# Patient Record
Sex: Female | Born: 1970
Health system: Southern US, Community
[De-identification: ages and names within clinical notes are randomized; demographics above are authoritative.]

## PROBLEM LIST (undated history)

## (undated) ENCOUNTER — Inpatient Hospital Stay (HOSPITAL_COMMUNITY): Payer: Self-pay

## (undated) DIAGNOSIS — O139 Gestational [pregnancy-induced] hypertension without significant proteinuria, unspecified trimester: Secondary | ICD-10-CM

## (undated) DIAGNOSIS — T4145XA Adverse effect of unspecified anesthetic, initial encounter: Secondary | ICD-10-CM

## (undated) DIAGNOSIS — L900 Lichen sclerosus et atrophicus: Secondary | ICD-10-CM

## (undated) DIAGNOSIS — I499 Cardiac arrhythmia, unspecified: Secondary | ICD-10-CM

## (undated) DIAGNOSIS — A630 Anogenital (venereal) warts: Secondary | ICD-10-CM

## (undated) DIAGNOSIS — C73 Malignant neoplasm of thyroid gland: Secondary | ICD-10-CM

## (undated) DIAGNOSIS — L309 Dermatitis, unspecified: Secondary | ICD-10-CM

## (undated) DIAGNOSIS — T8859XA Other complications of anesthesia, initial encounter: Secondary | ICD-10-CM

## (undated) DIAGNOSIS — F32A Depression, unspecified: Secondary | ICD-10-CM

## (undated) DIAGNOSIS — K219 Gastro-esophageal reflux disease without esophagitis: Secondary | ICD-10-CM

## (undated) DIAGNOSIS — E119 Type 2 diabetes mellitus without complications: Secondary | ICD-10-CM

## (undated) DIAGNOSIS — Z332 Encounter for elective termination of pregnancy: Secondary | ICD-10-CM

## (undated) DIAGNOSIS — T7840XA Allergy, unspecified, initial encounter: Secondary | ICD-10-CM

## (undated) DIAGNOSIS — M1990 Unspecified osteoarthritis, unspecified site: Secondary | ICD-10-CM

## (undated) DIAGNOSIS — O24419 Gestational diabetes mellitus in pregnancy, unspecified control: Secondary | ICD-10-CM

## (undated) DIAGNOSIS — I10 Essential (primary) hypertension: Secondary | ICD-10-CM

## (undated) HISTORY — PX: TUBAL LIGATION: SHX77

## (undated) HISTORY — DX: Depression, unspecified: F32.A

## (undated) HISTORY — DX: Allergy, unspecified, initial encounter: T78.40XA

## (undated) HISTORY — PX: WISDOM TOOTH EXTRACTION: SHX21

## (undated) HISTORY — DX: Unspecified osteoarthritis, unspecified site: M19.90

---

## 1990-10-10 HISTORY — PX: BLADDER SURGERY: SHX569

## 2006-10-10 HISTORY — PX: OTHER SURGICAL HISTORY: SHX169

## 2012-10-10 HISTORY — PX: MYOMECTOMY: SHX85

## 2013-09-10 ENCOUNTER — Other Ambulatory Visit: Payer: Self-pay | Admitting: *Deleted

## 2013-09-10 DIAGNOSIS — R0602 Shortness of breath: Secondary | ICD-10-CM

## 2013-09-13 ENCOUNTER — Ambulatory Visit: Payer: PRIVATE HEALTH INSURANCE | Admitting: *Deleted

## 2013-09-13 DIAGNOSIS — R0602 Shortness of breath: Secondary | ICD-10-CM

## 2013-09-17 ENCOUNTER — Ambulatory Visit: Payer: Self-pay | Admitting: Cardiology

## 2013-09-18 ENCOUNTER — Ambulatory Visit (HOSPITAL_COMMUNITY)
Admission: RE | Admit: 2013-09-18 | Discharge: 2013-09-18 | Disposition: A | Discharge status: Home / Self Care | Payer: PRIVATE HEALTH INSURANCE | Source: Ambulatory Visit | Attending: Cardiovascular Disease | Admitting: Cardiovascular Disease

## 2013-09-18 DIAGNOSIS — R0602 Shortness of breath: Secondary | ICD-10-CM

## 2013-10-08 ENCOUNTER — Encounter: Payer: Self-pay | Admitting: Cardiology

## 2014-03-29 ENCOUNTER — Inpatient Hospital Stay (HOSPITAL_COMMUNITY)
Admission: AD | Admit: 2014-03-29 | Discharge: 2014-03-29 | Disposition: A | Discharge status: Home / Self Care | Payer: PRIVATE HEALTH INSURANCE | Source: Ambulatory Visit | Attending: Obstetrics and Gynecology | Admitting: Obstetrics and Gynecology

## 2014-03-29 ENCOUNTER — Encounter (HOSPITAL_COMMUNITY): Payer: Self-pay | Admitting: *Deleted

## 2014-03-29 ENCOUNTER — Inpatient Hospital Stay (HOSPITAL_COMMUNITY): Payer: PRIVATE HEALTH INSURANCE

## 2014-03-29 DIAGNOSIS — O200 Threatened abortion: Secondary | ICD-10-CM | POA: Insufficient documentation

## 2014-03-29 DIAGNOSIS — Z87891 Personal history of nicotine dependence: Secondary | ICD-10-CM | POA: Insufficient documentation

## 2014-03-29 DIAGNOSIS — O4690 Antepartum hemorrhage, unspecified, unspecified trimester: Secondary | ICD-10-CM

## 2014-03-29 DIAGNOSIS — O209 Hemorrhage in early pregnancy, unspecified: Secondary | ICD-10-CM | POA: Diagnosis present

## 2014-03-29 DIAGNOSIS — O09819 Supervision of pregnancy resulting from assisted reproductive technology, unspecified trimester: Secondary | ICD-10-CM | POA: Insufficient documentation

## 2014-03-29 HISTORY — DX: Anogenital (venereal) warts: A63.0

## 2014-03-29 HISTORY — DX: Cardiac arrhythmia, unspecified: I49.9

## 2014-03-29 LAB — WET PREP, GENITAL
Clue Cells Wet Prep HPF POC: NONE SEEN
Trich, Wet Prep: NONE SEEN
Yeast Wet Prep HPF POC: NONE SEEN

## 2014-03-29 LAB — URINALYSIS, ROUTINE W REFLEX MICROSCOPIC
Bilirubin Urine: NEGATIVE
Glucose, UA: NEGATIVE mg/dL
Ketones, ur: NEGATIVE mg/dL
Leukocytes, UA: NEGATIVE
Nitrite: NEGATIVE
Protein, ur: NEGATIVE mg/dL
Specific Gravity, Urine: <1.005 — ABNORMAL LOW (ref 1.005–1.030)
Urobilinogen, UA: 0.2 mg/dL (ref 0.0–1.0)
pH: 5.5 (ref 5.0–8.0)

## 2014-03-29 LAB — ABO/RH: ABO/RH(D): O POS

## 2014-03-29 LAB — URINE MICROSCOPIC-ADD ON

## 2014-03-29 LAB — CBC
HCT: 37.8 % (ref 36.0–46.0)
Hemoglobin: 13 g/dL (ref 12.0–15.0)
MCH: 29.7 pg (ref 26.0–34.0)
MCHC: 34.4 g/dL (ref 30.0–36.0)
MCV: 86.5 fL (ref 78.0–100.0)
Platelets: 217 10*3/uL (ref 150–400)
RBC: 4.37 MIL/uL (ref 3.87–5.11)
RDW: 13.2 % (ref 11.5–15.5)
WBC: 10.8 10*3/uL — ABNORMAL HIGH (ref 4.0–10.5)

## 2014-03-29 LAB — POCT PREGNANCY, URINE: Preg Test, Ur: POSITIVE — AB

## 2014-03-29 LAB — HCG, QUANTITATIVE, PREGNANCY: hCG, Beta Chain, Quant, S: 12599 m[IU]/mL — ABNORMAL HIGH (ref <5)

## 2014-03-29 LAB — PROGESTERONE: Progesterone: 22.2 ng/mL

## 2014-03-29 NOTE — MAU Note (Signed)
Patient presents with complaint of vaginal bleeding since 1100 today.

## 2014-03-29 NOTE — MAU Provider Note (Signed)
History     CSN: [id number]  Arrival date and time: 03/29/14 1143 Provider notified by MAU Provider: [id number] Provider on unit: 1250 Provider at bedside: 1300   First Provider Initiated Contact with Patient 03/29/14 1241 (by MAU Provider)    Chief Complaint  Patient presents with  . Vaginal Bleeding   HPI  Pamela [name] is 43 y.o. G2P0010 female at 61.[redacted]wks gestation determined by IVF conception date of 03/02/2014 presenting today with complaints of VB since 43. Denies pain.  Her care is being followed by Dr. Kerin Perna at this time.  She had an ultrasound Thursday 6/18 that she states was "normal".  She is taking Progesterone.    Past Medical History  Diagnosis Date  . Genital warts     removed  . Recurrent UTI (urinary tract infection)   . Dysrhythmia     resolved- "grew out of it"  . Bleeding in early pregnancy 03/29/2014    Past Surgical History  Procedure Laterality Date  . Bladder surgery  1992    Widened the urethra  . Tubal ligation  2008  . Myomectomy  2008  . Myomectomy  09/2013    Removed tubes bilaterally  - Salpingectomy          09/2013  History reviewed. No pertinent family history.  History  Substance Use Topics  . Smoking status: Former Research scientist (life sciences)  . Smokeless tobacco: Never Used  . Alcohol Use: Yes     Comment: Socially PRIOR to pregnancy    Allergies:  Allergies  Allergen Reactions  . Augmentin [Amoxicillin-Pot Clavulanate] Nausea And Vomiting    No prescriptions prior to admission    Review of Systems  Constitutional: Negative.   HENT: Negative.   Eyes: Negative.   Respiratory: Negative.   Cardiovascular: Negative.   Gastrointestinal: Negative.   Genitourinary:       (+) VB  Musculoskeletal: Negative.   Skin: Negative.   Neurological: Negative.   Endo/Heme/Allergies: Negative.   Psychiatric/Behavioral: Negative.    Results for orders placed during the hospital encounter of 03/29/14 (from the past 24 hour(s))  URINALYSIS,  ROUTINE W REFLEX MICROSCOPIC     Status: Abnormal   Collection Time    03/29/14 11:50 AM      Result Value Ref Range   Color, Urine YELLOW  YELLOW   APPearance CLEAR  CLEAR   Specific Gravity, Urine <1.005 (*) 1.005 - 1.030   pH 5.5  5.0 - 8.0   Glucose, UA NEGATIVE  NEGATIVE mg/dL   Hgb urine dipstick LARGE (*) NEGATIVE   Bilirubin Urine NEGATIVE  NEGATIVE   Ketones, ur NEGATIVE  NEGATIVE mg/dL   Protein, ur NEGATIVE  NEGATIVE mg/dL   Urobilinogen, UA 0.2  0.0 - 1.0 mg/dL   Nitrite NEGATIVE  NEGATIVE   Leukocytes, UA NEGATIVE  NEGATIVE  URINE MICROSCOPIC-ADD ON     Status: None   Collection Time    03/29/14 11:50 AM      Result Value Ref Range   Squamous Epithelial / LPF RARE  RARE   WBC, UA 0-2  <3 WBC/hpf   RBC / HPF 0-2  <3 RBC/hpf  POCT PREGNANCY, URINE     Status: Abnormal   Collection Time    03/29/14 12:46 PM      Result Value Ref Range   Preg Test, Ur POSITIVE (*) NEGATIVE  HCG, QUANTITATIVE, PREGNANCY     Status: Abnormal   Collection Time    03/29/14 12:53 PM  Result Value Ref Range   hCG, Beta Chain, [place], [place] [address] (*) <5 mIU/mL  ABO/RH     Status: None   Collection Time    03/29/14  1:09 PM      Result Value Ref Range   ABO/RH(D) O POS    WET PREP, GENITAL     Status: Abnormal   Collection Time    03/29/14  1:25 PM      Result Value Ref Range   Yeast Wet Prep HPF POC NONE SEEN  NONE SEEN   Trich, Wet Prep NONE SEEN  NONE SEEN   Clue Cells Wet Prep HPF POC NONE SEEN  NONE SEEN   WBC, Wet Prep HPF POC FEW (*) NONE SEEN  CBC     Status: Abnormal   Collection Time    03/29/14  2:34 PM      Result Value Ref Range   WBC 10.8 (*) 4.0 - 10.5 K/uL   RBC 4.37  3.87 - 5.11 MIL/uL   Hemoglobin 13.0  12.0 - 15.0 g/dL   HCT 37.8  36.0 - 46.0 %   MCV 86.5  78.0 - 100.0 fL   MCH 29.7  26.0 - 34.0 pg   MCHC 34.4  30.0 - 36.0 g/dL   RDW 13.2  11.5 - 15.5 %   Platelets 217  150 - 400 K/uL   OB U/S <14 wks CLINICAL DATA: 43 year old pregnant female with  bleeding. IVF with  estimated gestational age of [redacted] weeks 6 days. Beta HCG of 12,600.  EXAM:  OBSTETRIC <14 WK [place] AND TRANSVAGINAL OB US  TECHNIQUE:  Both transabdominal and transvaginal ultrasound examinations were  performed for complete evaluation of the gestation as well as the  maternal uterus, adnexal regions, and pelvic cul-de-sac.  Transvaginal technique was performed to assess early pregnancy.  COMPARISON: None.  FINDINGS:  Intrauterine gestational sac: Visualized/normal in shape.  Yolk sac: Visualized  Embryo: Visualized  Cardiac Activity: Equivocal with suggestion of fetal heart flicker  at real-time but untraceable M-Mode.  CRL: 2.2 mm 5 w 6 d [place] EDC: 11/23/2014  Maternal uterus/adnexae: There is no evidence of subchorionic  hemorrhage.  Ovaries bilaterally are unremarkable.  Multiple uterine fibroids are identified with the largest as  follows:  1.1 x 1.1 x 2.1 cm anterior right lower uterine segment fibroid  adjacent to the gestational sac.  1 x 1.1 x 1.1 cm left anterior fundal fibroid adjacent to the  gestational sac.  1.6 x 1.5 x 1.4 cm posterior left intramural uterine body fibroid.  No free fluid or adnexal mass identified.  IMPRESSION:  Single intrauterine gestation with equivocal cardiac activity as  described. Estimated gestational age and gestational age by this  ultrasound are 5 weeks 6 days.  Uterine fibroids as described, two which lie adjacent to the  intrauterine gestational sac.  No evidence of subchorionic hemorrhage.   Electronically Signed  By: Hassan Rowan M.D.  On: 03/29/2014 14:31  Physical Exam   Blood pressure 130/79, pulse 68, temperature 98.3 F (36.8 C), temperature source Oral, resp. rate 18, height 5\' 8"  (1.727 m), weight 93.441 kg (206 lb), last menstrual period 11/22/2013.  Physical Exam  Constitutional: She is oriented to person, place, and time. She appears well-developed and well-nourished.  HENT:  Head: Normocephalic and  atraumatic.  Eyes: Pupils are equal, round, and reactive to light.  Neck: Normal range of motion. Neck supple.  Cardiovascular: Normal rate, regular rhythm and normal heart sounds.   Respiratory: Effort  normal and breath sounds normal.  GI: Soft. Bowel sounds are normal.  Genitourinary: Uterus normal.  Small amount of dark, red blood in vaginal vault; cx closed/thick/high/firm; enlarged uterus  Musculoskeletal: Normal range of motion.  Neurological: She is alert and oriented to person, place, and time.  Skin: Skin is warm and dry.  Psychiatric: She has a normal mood and affect. Her behavior is normal. Judgment and thought content normal.    MAU Course  Procedures ABO/Rh CBC HCG Wet prep GC/CT OB U/S <14wks  Assessment and Plan  43 yo G2P0010 @ 5.6wks IVF gestation Bleeding in early Pregnancy Threatened Miscarriage  Discharge Home Bleeding and Threatened miscarriage precautions reviewed F/U with Dr. Kerin Perna either Monday (6/22) or Tuesday (6/23) Call for increased bleeding and/or pain  *Dr. Ronita Hipps notified of assessment and plan - agrees  Graceann Congress MSN, CNM 03/29/2014, 3:14 PM

## 2014-03-29 NOTE — Discharge Instructions (Signed)
Vaginal Bleeding During Pregnancy, First Trimester °A small amount of bleeding (spotting) from the vagina is relatively common in early pregnancy. It usually stops on its own. Various things may cause bleeding or spotting in early pregnancy. Some bleeding may be related to the pregnancy, and some may not. In most cases, the bleeding is normal and is not a problem. However, bleeding can also be a sign of something serious. Be sure to tell your health care provider about any vaginal bleeding right away. °Some possible causes of vaginal bleeding during the first trimester include: °· Infection or inflammation of the cervix. °· Growths (polyps) on the cervix. °· Miscarriage or threatened miscarriage. °· Pregnancy tissue has developed outside of the uterus and in a fallopian tube (tubal pregnancy). °· Tiny cysts have developed in the uterus instead of pregnancy tissue (molar pregnancy). °HOME CARE INSTRUCTIONS  °Watch your condition for any changes. The following actions may help to lessen any discomfort you are feeling: °· Follow your health care provider's instructions for limiting your activity. If your health care provider orders bed rest, you may need to stay in bed and only get up to use the bathroom. However, your health care provider may allow you to continue light activity. °· If needed, make plans for someone to help with your regular activities and responsibilities while you are on bed rest. °· Keep track of the number of pads you use each day, how often you change pads, and how soaked (saturated) they are. Write this down. °· Do not use tampons. Do not douche. °· Do not have sexual intercourse or orgasms until approved by your health care provider. °· If you pass any tissue from your vagina, save the tissue so you can show it to your health care provider. °· Only take over-the-counter or prescription medicines as directed by your health care provider. °· Do not take aspirin because it can make you  bleed. °· Keep all follow-up appointments as directed by your health care provider. °SEEK MEDICAL CARE IF: °· You have any vaginal bleeding during any part of your pregnancy. °· You have cramps or labor pains. °· You have a fever, not controlled by medicine. °SEEK IMMEDIATE MEDICAL CARE IF:  °· You have severe cramps in your back or belly (abdomen). °· You pass large clots or tissue from your vagina. °· Your bleeding increases. °· You feel light-headed or weak, or you have fainting episodes. °· You have chills. °· You are leaking fluid or have a gush of fluid from your vagina. °· You pass out while having a bowel movement. °MAKE SURE YOU: °· Understand these instructions. °· Will watch your condition. °· Will get help right away if you are not doing well or get worse. °Document Released: 07/06/2005 Document Revised: 10/01/2013 Document Reviewed: 06/03/2013 °ExitCare® Patient Information ©2015 [employer]. This information is not intended to replace advice given to you by your health care provider. Make sure you discuss any questions you have with your health care provider. °Threatened Miscarriage °A threatened miscarriage occurs when you have vaginal bleeding during your first 20 weeks of pregnancy but the pregnancy has not ended. If you have vaginal bleeding during this time, your health care provider will do tests to make sure you are still pregnant. If the tests show you are still pregnant and the developing baby (fetus) inside your womb (uterus) is still growing, your condition is considered a threatened miscarriage. °A threatened miscarriage does not mean your pregnancy will end, but it does   increase the risk of losing your pregnancy (complete miscarriage). °CAUSES  °The cause of a threatened miscarriage is usually not known. If you go on to have a complete miscarriage, the most common cause is an abnormal number of chromosomes in the developing baby. Chromosomes are the structures inside cells that hold all  your genetic material. °Some causes of vaginal bleeding that do not result in miscarriage include: °· Having sex. °· Having an infection. °· Normal hormone changes of pregnancy. °· Bleeding that occurs when an egg implants in your uterus. °RISK FACTORS °Risk factors for bleeding in early pregnancy include: °· Obesity. °· Smoking. °· Drinking excessive amounts of alcohol or caffeine. °· Recreational drug use. °SIGNS AND SYMPTOMS °· Light vaginal bleeding. °· Mild abdominal pain or cramps. °DIAGNOSIS  °If you have bleeding with or without abdominal pain before 20 weeks of pregnancy, your health care provider will do tests to check whether you are still pregnant. One important test involves using sound waves and a computer (ultrasound) to create images of the inside of your uterus. Other tests include an internal exam of your vagina and uterus (pelvic exam) and measurement of your baby's heart rate.  °You may be diagnosed with a threatened miscarriage if: °· Ultrasound testing shows you are still pregnant. °· Your baby's heart rate is strong. °· A pelvic exam shows that the opening between your uterus and your vagina (cervix) is closed. °· Your heart rate and blood pressure are stable. °· Blood tests confirm you are still pregnant. °TREATMENT  °No treatments have been shown to prevent a threatened miscarriage from going on to a complete miscarriage. However, the right home care is important.  °HOME CARE INSTRUCTIONS  °· Make sure you keep all your appointments for prenatal care. This is very important. °· Get plenty of rest. °· Do not have sex or use tampons if you have vaginal bleeding. °· Do not douche. °· Do not smoke or use recreational drugs. °· Do not drink alcohol. °· Avoid caffeine. °SEEK MEDICAL CARE IF: °· You have light vaginal bleeding or spotting while pregnant. °· You have abdominal pain or cramping. °· You have a fever. °SEEK IMMEDIATE MEDICAL CARE IF: °· You have heavy vaginal bleeding. °· You have  blood clots coming from your vagina. °· You have severe low back pain or abdominal cramps. °· You have fever, chills, and severe abdominal pain. °MAKE SURE YOU: °· Understand these instructions. °· Will watch your condition. °· Will get help right away if you are not doing well or get worse. °Document Released: 09/26/2005 Document Revised: 10/01/2013 Document Reviewed: 07/23/2013 °ExitCare® Patient Information ©2015 [employer]. This information is not intended to replace advice given to you by your health care provider. Make sure you discuss any questions you have with your health care provider. ° ° °

## 2014-03-31 LAB — GC/CHLAMYDIA PROBE AMP
CT Probe RNA: NEGATIVE
GC Probe RNA: NEGATIVE

## 2014-04-25 LAB — OB RESULTS CONSOLE ANTIBODY SCREEN: Antibody Screen: NEGATIVE

## 2014-04-25 LAB — OB RESULTS CONSOLE HEPATITIS B SURFACE ANTIGEN: Hepatitis B Surface Ag: NEGATIVE

## 2014-04-25 LAB — OB RESULTS CONSOLE ABO/RH: RH Type: POSITIVE

## 2014-04-25 LAB — OB RESULTS CONSOLE RUBELLA ANTIBODY, IGM: Rubella: IMMUNE

## 2014-04-25 LAB — OB RESULTS CONSOLE HIV ANTIBODY (ROUTINE TESTING): HIV: NONREACTIVE

## 2014-04-25 LAB — OB RESULTS CONSOLE RPR: RPR: NONREACTIVE

## 2014-08-11 ENCOUNTER — Encounter (HOSPITAL_COMMUNITY): Payer: Self-pay | Admitting: *Deleted

## 2014-09-22 ENCOUNTER — Other Ambulatory Visit: Payer: Self-pay | Admitting: Obstetrics & Gynecology

## 2014-10-10 DIAGNOSIS — I10 Essential (primary) hypertension: Secondary | ICD-10-CM

## 2014-10-10 HISTORY — DX: Essential (primary) hypertension: I10

## 2014-11-03 ENCOUNTER — Encounter (HOSPITAL_COMMUNITY): Payer: Self-pay

## 2014-11-03 ENCOUNTER — Encounter (HOSPITAL_COMMUNITY)
Admission: RE | Admit: 2014-11-03 | Discharge: 2014-11-03 | Disposition: A | Discharge status: Home / Self Care | Payer: No Typology Code available for payment source | Source: Ambulatory Visit | Attending: Obstetrics & Gynecology | Admitting: Obstetrics & Gynecology

## 2014-11-03 VITALS — BP 129/87 | HR 58 | Resp 18 | Ht 68.0 in | Wt 218.0 lb

## 2014-11-03 DIAGNOSIS — O209 Hemorrhage in early pregnancy, unspecified: Secondary | ICD-10-CM

## 2014-11-03 HISTORY — DX: Essential (primary) hypertension: I10

## 2014-11-03 HISTORY — DX: Gastro-esophageal reflux disease without esophagitis: K21.9

## 2014-11-03 HISTORY — DX: Encounter for elective termination of pregnancy: Z33.2

## 2014-11-03 LAB — BASIC METABOLIC PANEL
Anion gap: 9 (ref 5–15)
BUN: 12 mg/dL (ref 6–23)
CO2: 22 mmol/L (ref 19–32)
Calcium: 10 mg/dL (ref 8.4–10.5)
Chloride: 105 mmol/L (ref 96–112)
Creatinine, Ser: 0.73 mg/dL (ref 0.50–1.10)
GFR calc Af Amer: >90 mL/min (ref >90)
GFR calc non Af Amer: >90 mL/min (ref >90)
Glucose, Bld: 136 mg/dL — ABNORMAL HIGH (ref 70–99)
Potassium: 4.2 mmol/L (ref 3.5–5.1)
Sodium: 136 mmol/L (ref 135–145)

## 2014-11-03 LAB — CBC
HCT: 38.4 % (ref 36.0–46.0)
Hemoglobin: 13.1 g/dL (ref 12.0–15.0)
MCH: 29.9 pg (ref 26.0–34.0)
MCHC: 34.1 g/dL (ref 30.0–36.0)
MCV: 87.7 fL (ref 78.0–100.0)
Platelets: 197 10*3/uL (ref 150–400)
RBC: 4.38 MIL/uL (ref 3.87–5.11)
RDW: 13.7 % (ref 11.5–15.5)
WBC: 11.3 10*3/uL — ABNORMAL HIGH (ref 4.0–10.5)

## 2014-11-03 LAB — TYPE AND SCREEN
ABO/RH(D): O POS
Antibody Screen: NEGATIVE

## 2014-11-03 NOTE — Anesthesia Preprocedure Evaluation (Signed)
Anesthesia Evaluation  Patient identified by MRN, date of birth, ID band Patient awake    Reviewed: Allergy & Precautions, NPO status , Patient's Chart, lab work & pertinent test results  History of Anesthesia Complications Negative for: history of anesthetic complications  Airway Mallampati: II  TM Distance: >3 FB Neck ROM: Full    Dental no notable dental hx. (+) Dental Advisory Given   Pulmonary former smoker,  breath sounds clear to auscultation  Pulmonary exam normal       Cardiovascular + dysrhythmias Rhythm:Regular Rate:Normal     Neuro/Psych negative neurological ROS  negative psych ROS   GI/Hepatic negative GI ROS, Neg liver ROS,   Endo/Other  negative endocrine ROS  Renal/GU negative Renal ROS  negative genitourinary   Musculoskeletal negative musculoskeletal ROS (+)   Abdominal   Peds negative pediatric ROS (+)  Hematology negative hematology ROS (+)   Anesthesia Other Findings   Reproductive/Obstetrics (+) Pregnancy                             Anesthesia Physical Anesthesia Plan  ASA: II  Anesthesia Plan: Spinal   Post-op Pain Management:    Induction:   Airway Management Planned:   Additional Equipment:   Intra-op Plan:   Post-operative Plan:   Informed Consent: I have reviewed the patients History and Physical, chart, labs and discussed the procedure including the risks, benefits and alternatives for the proposed anesthesia with the patient or authorized representative who has indicated his/her understanding and acceptance.   Dental advisory given  Plan Discussed with: CRNA  Anesthesia Plan Comments:         Anesthesia Quick Evaluation

## 2014-11-03 NOTE — Patient Instructions (Addendum)
   Your procedure is scheduled on: Tuesday, Jan 26  Enter through the Main Entrance of [facility] at: 12:45 PM Pick up the phone at the desk and dial [phone] and inform us of your arrival.  Please call this number if you have any problems the morning of surgery: [phone]  Remember: Do not eat food after midnight: Monday Do not drink clear liquids after: 10 AM Tuesday, day of surgery Take these medicines the morning of surgery with a SIP OF WATER: labetalol  Do not wear jewelry, make-up, or FINGER nail polish No metal in your hair or on your body. Do not wear lotions, powders, perfumes.  You may wear deodorant.  Do not bring valuables to the hospital. Contacts, dentures or bridgework may not be worn into surgery.  Leave suitcase in the car. After Surgery it may be brought to your room. For patients being admitted to the hospital, checkout time is 11:00am the day of discharge.  Home with husband Ronalee Belts cell [phone]

## 2014-11-04 ENCOUNTER — Inpatient Hospital Stay (HOSPITAL_COMMUNITY): Payer: No Typology Code available for payment source | Admitting: Anesthesiology

## 2014-11-04 ENCOUNTER — Encounter (HOSPITAL_COMMUNITY)
Admission: RE | Disposition: A | Discharge status: Home / Self Care | Payer: Self-pay | Source: Ambulatory Visit | Attending: Obstetrics & Gynecology

## 2014-11-04 ENCOUNTER — Encounter (HOSPITAL_COMMUNITY): Payer: Self-pay | Admitting: *Deleted

## 2014-11-04 ENCOUNTER — Inpatient Hospital Stay (HOSPITAL_COMMUNITY)
Admission: RE | Admit: 2014-11-04 | Discharge: 2014-11-07 | DRG: 765 | Disposition: A | Discharge status: Home / Self Care | Payer: No Typology Code available for payment source | Source: Ambulatory Visit | Attending: Obstetrics & Gynecology | Admitting: Obstetrics & Gynecology

## 2014-11-04 DIAGNOSIS — O3413 Maternal care for benign tumor of corpus uteri, third trimester: Secondary | ICD-10-CM | POA: Diagnosis present

## 2014-11-04 DIAGNOSIS — Z79899 Other long term (current) drug therapy: Secondary | ICD-10-CM

## 2014-11-04 DIAGNOSIS — O43813 Placental infarction, third trimester: Secondary | ICD-10-CM | POA: Diagnosis present

## 2014-11-04 DIAGNOSIS — O133 Gestational [pregnancy-induced] hypertension without significant proteinuria, third trimester: Secondary | ICD-10-CM | POA: Diagnosis present

## 2014-11-04 DIAGNOSIS — Z3A37 37 weeks gestation of pregnancy: Secondary | ICD-10-CM | POA: Diagnosis present

## 2014-11-04 DIAGNOSIS — O99214 Obesity complicating childbirth: Secondary | ICD-10-CM | POA: Diagnosis present

## 2014-11-04 DIAGNOSIS — O209 Hemorrhage in early pregnancy, unspecified: Secondary | ICD-10-CM | POA: Diagnosis present

## 2014-11-04 DIAGNOSIS — Z6834 Body mass index (BMI) 34.0-34.9, adult: Secondary | ICD-10-CM

## 2014-11-04 DIAGNOSIS — O09813 Supervision of pregnancy resulting from assisted reproductive technology, third trimester: Secondary | ICD-10-CM

## 2014-11-04 DIAGNOSIS — O9962 Diseases of the digestive system complicating childbirth: Secondary | ICD-10-CM | POA: Diagnosis present

## 2014-11-04 DIAGNOSIS — K219 Gastro-esophageal reflux disease without esophagitis: Secondary | ICD-10-CM | POA: Diagnosis present

## 2014-11-04 DIAGNOSIS — Z9889 Other specified postprocedural states: Secondary | ICD-10-CM | POA: Diagnosis not present

## 2014-11-04 DIAGNOSIS — O09523 Supervision of elderly multigravida, third trimester: Secondary | ICD-10-CM

## 2014-11-04 DIAGNOSIS — Z881 Allergy status to other antibiotic agents status: Secondary | ICD-10-CM | POA: Diagnosis not present

## 2014-11-04 DIAGNOSIS — O365930 Maternal care for other known or suspected poor fetal growth, third trimester, not applicable or unspecified: Secondary | ICD-10-CM | POA: Diagnosis present

## 2014-11-04 DIAGNOSIS — D62 Acute posthemorrhagic anemia: Secondary | ICD-10-CM | POA: Diagnosis not present

## 2014-11-04 DIAGNOSIS — Z87440 Personal history of urinary (tract) infections: Secondary | ICD-10-CM | POA: Diagnosis not present

## 2014-11-04 DIAGNOSIS — D250 Submucous leiomyoma of uterus: Secondary | ICD-10-CM | POA: Diagnosis present

## 2014-11-04 DIAGNOSIS — E669 Obesity, unspecified: Secondary | ICD-10-CM | POA: Diagnosis present

## 2014-11-04 DIAGNOSIS — Z87891 Personal history of nicotine dependence: Secondary | ICD-10-CM

## 2014-11-04 HISTORY — PX: MYOMECTOMY: SHX85

## 2014-11-04 LAB — RPR: RPR Ser Ql: NONREACTIVE

## 2014-11-04 SURGERY — Surgical Case
Anesthesia: Spinal | Site: Abdomen

## 2014-11-04 MED ORDER — BUPIVACAINE HCL (PF) 0.25 % IJ SOLN
INTRAMUSCULAR | Status: AC
  Filled 2014-11-04: qty 30

## 2014-11-04 MED ORDER — TETANUS-DIPHTH-ACELL PERTUSSIS 5-2.5-18.5 LF-MCG/0.5 IM SUSP: 0.5000 mL | Freq: Once | INTRAMUSCULAR | Status: DC

## 2014-11-04 MED ORDER — SIMETHICONE 80 MG PO CHEW
80.0000 mg | CHEWABLE_TABLET | ORAL | Status: DC
  Administered 2014-11-06 (×2): 80 mg via ORAL
  Filled 2014-11-04 (×3): qty 1

## 2014-11-04 MED ORDER — SCOPOLAMINE 1 MG/3DAYS TD PT72
1.0000 | MEDICATED_PATCH | Freq: Once | TRANSDERMAL | Status: DC
  Administered 2014-11-04: 1.5 mg via TRANSDERMAL

## 2014-11-04 MED ORDER — LACTATED RINGERS IV SOLN
40.0000 [IU] | INTRAVENOUS | Status: DC | PRN
  Administered 2014-11-04: 40 [IU] via INTRAVENOUS

## 2014-11-04 MED ORDER — NALOXONE HCL 0.4 MG/ML IJ SOLN: 0.4000 mg | INTRAMUSCULAR | Status: DC | PRN

## 2014-11-04 MED ORDER — SCOPOLAMINE 1 MG/3DAYS TD PT72: 1.0000 | MEDICATED_PATCH | Freq: Once | TRANSDERMAL | Status: DC

## 2014-11-04 MED ORDER — DIBUCAINE 1 % RE OINT: 1.0000 "application " | TOPICAL_OINTMENT | RECTAL | Status: DC | PRN

## 2014-11-04 MED ORDER — DIPHENHYDRAMINE HCL 25 MG PO CAPS: 25.0000 mg | ORAL_CAPSULE | Freq: Four times a day (QID) | ORAL | Status: DC | PRN

## 2014-11-04 MED ORDER — CEFAZOLIN SODIUM-DEXTROSE 2-3 GM-% IV SOLR
2.0000 g | INTRAVENOUS | Status: AC
  Administered 2014-11-04: 2 g via INTRAVENOUS

## 2014-11-04 MED ORDER — ONDANSETRON HCL 4 MG PO TABS: 4.0000 mg | ORAL_TABLET | ORAL | Status: DC | PRN

## 2014-11-04 MED ORDER — NALOXONE HCL 1 MG/ML IJ SOLN: 1.0000 ug/kg/h | INTRAMUSCULAR | Status: DC | PRN

## 2014-11-04 MED ORDER — ONDANSETRON HCL 4 MG/2ML IJ SOLN: 4.0000 mg | Freq: Three times a day (TID) | INTRAMUSCULAR | Status: DC | PRN

## 2014-11-04 MED ORDER — LACTATED RINGERS IV SOLN
INTRAVENOUS | Status: DC
  Administered 2014-11-04 (×2): via INTRAVENOUS

## 2014-11-04 MED ORDER — ACETAMINOPHEN 500 MG PO TABS
1000.0000 mg | ORAL_TABLET | Freq: Four times a day (QID) | ORAL | Status: DC
  Filled 2014-11-04: qty 2

## 2014-11-04 MED ORDER — PRENATAL MULTIVITAMIN CH
1.0000 | ORAL_TABLET | Freq: Every day | ORAL | Status: DC
  Administered 2014-11-05 – 2014-11-07 (×4): 1 via ORAL
  Filled 2014-11-04 (×3): qty 1

## 2014-11-04 MED ORDER — ERYTHROMYCIN 5 MG/GM OP OINT
TOPICAL_OINTMENT | OPHTHALMIC | Status: AC
Start: 2014-11-04 — End: 2014-11-05
  Filled 2014-11-04: qty 1

## 2014-11-04 MED ORDER — BUPIVACAINE IN DEXTROSE 0.75-8.25 % IT SOLN
INTRATHECAL | Status: DC | PRN
  Administered 2014-11-04: 1.6 mL via INTRATHECAL

## 2014-11-04 MED ORDER — KETOROLAC TROMETHAMINE 30 MG/ML IJ SOLN
INTRAMUSCULAR | Status: AC
  Filled 2014-11-04: qty 1

## 2014-11-04 MED ORDER — MENTHOL 3 MG MT LOZG: 1.0000 | LOZENGE | OROMUCOSAL | Status: DC | PRN

## 2014-11-04 MED ORDER — ONDANSETRON HCL 4 MG/2ML IJ SOLN: 4.0000 mg | Freq: Once | INTRAMUSCULAR | Status: DC | PRN

## 2014-11-04 MED ORDER — SCOPOLAMINE 1 MG/3DAYS TD PT72
MEDICATED_PATCH | TRANSDERMAL | Status: AC
  Administered 2014-11-04: 1.5 mg via TRANSDERMAL
  Filled 2014-11-04: qty 1

## 2014-11-04 MED ORDER — IBUPROFEN 600 MG PO TABS
600.0000 mg | ORAL_TABLET | Freq: Four times a day (QID) | ORAL | Status: DC
  Administered 2014-11-05 – 2014-11-07 (×9): 600 mg via ORAL
  Filled 2014-11-04 (×10): qty 1

## 2014-11-04 MED ORDER — LACTATED RINGERS IV SOLN
Freq: Once | INTRAVENOUS | Status: AC
Start: 2014-11-04 — End: 2014-11-04
  Administered 2014-11-04: 13:00:00 via INTRAVENOUS

## 2014-11-04 MED ORDER — LANOLIN HYDROUS EX OINT: 1.0000 "application " | TOPICAL_OINTMENT | CUTANEOUS | Status: DC | PRN

## 2014-11-04 MED ORDER — DIPHENHYDRAMINE HCL 25 MG PO CAPS: 25.0000 mg | ORAL_CAPSULE | ORAL | Status: DC | PRN

## 2014-11-04 MED ORDER — DIPHENHYDRAMINE HCL 50 MG/ML IJ SOLN
12.5000 mg | INTRAMUSCULAR | Status: DC | PRN
Start: 2014-11-04 — End: 2014-11-05

## 2014-11-04 MED ORDER — SODIUM CHLORIDE 0.9 % IJ SOLN: 3.0000 mL | INTRAMUSCULAR | Status: DC | PRN

## 2014-11-04 MED ORDER — ONDANSETRON HCL 4 MG/2ML IJ SOLN
INTRAMUSCULAR | Status: AC
  Filled 2014-11-04: qty 2

## 2014-11-04 MED ORDER — NALBUPHINE HCL 10 MG/ML IJ SOLN: 5.0000 mg | Freq: Once | INTRAMUSCULAR | Status: AC | PRN

## 2014-11-04 MED ORDER — FENTANYL CITRATE 0.05 MG/ML IJ SOLN
INTRAMUSCULAR | Status: AC
  Filled 2014-11-04: qty 2

## 2014-11-04 MED ORDER — LABETALOL HCL 200 MG PO TABS
200.0000 mg | ORAL_TABLET | Freq: Two times a day (BID) | ORAL | Status: DC
  Filled 2014-11-04 (×3): qty 1

## 2014-11-04 MED ORDER — SIMETHICONE 80 MG PO CHEW
80.0000 mg | CHEWABLE_TABLET | Freq: Three times a day (TID) | ORAL | Status: DC
  Administered 2014-11-05 – 2014-11-07 (×7): 80 mg via ORAL
  Filled 2014-11-04 (×7): qty 1

## 2014-11-04 MED ORDER — MAGNESIUM HYDROXIDE 400 MG/5ML PO SUSP: 30.0000 mL | ORAL | Status: DC | PRN

## 2014-11-04 MED ORDER — CEFAZOLIN SODIUM-DEXTROSE 2-3 GM-% IV SOLR
INTRAVENOUS | Status: AC
  Filled 2014-11-04: qty 50

## 2014-11-04 MED ORDER — LACTATED RINGERS IV SOLN
INTRAVENOUS | Status: DC | PRN
  Administered 2014-11-04: 15:00:00 via INTRAVENOUS

## 2014-11-04 MED ORDER — NALBUPHINE HCL 10 MG/ML IJ SOLN: 5.0000 mg | INTRAMUSCULAR | Status: DC | PRN

## 2014-11-04 MED ORDER — ZOLPIDEM TARTRATE 5 MG PO TABS: 5.0000 mg | ORAL_TABLET | Freq: Every evening | ORAL | Status: DC | PRN

## 2014-11-04 MED ORDER — MORPHINE SULFATE (PF) 0.5 MG/ML IJ SOLN
INTRAMUSCULAR | Status: DC | PRN
  Administered 2014-11-04: .2 mg via INTRATHECAL

## 2014-11-04 MED ORDER — MEPERIDINE HCL 25 MG/ML IJ SOLN: 6.2500 mg | INTRAMUSCULAR | Status: DC | PRN

## 2014-11-04 MED ORDER — SIMETHICONE 80 MG PO CHEW: 80.0000 mg | CHEWABLE_TABLET | ORAL | Status: DC | PRN

## 2014-11-04 MED ORDER — KETOROLAC TROMETHAMINE 30 MG/ML IJ SOLN: 30.0000 mg | Freq: Four times a day (QID) | INTRAMUSCULAR | Status: DC | PRN

## 2014-11-04 MED ORDER — ONDANSETRON HCL 4 MG/2ML IJ SOLN: 4.0000 mg | INTRAMUSCULAR | Status: DC | PRN

## 2014-11-04 MED ORDER — SENNOSIDES-DOCUSATE SODIUM 8.6-50 MG PO TABS
2.0000 | ORAL_TABLET | ORAL | Status: DC
  Administered 2014-11-06 (×2): 2 via ORAL
  Filled 2014-11-04 (×4): qty 2

## 2014-11-04 MED ORDER — PHENYLEPHRINE 8 MG IN D5W 100 ML (0.08MG/ML) PREMIX OPTIME
INJECTION | INTRAVENOUS | Status: DC | PRN
  Administered 2014-11-04: 60 ug/min via INTRAVENOUS

## 2014-11-04 MED ORDER — FENTANYL CITRATE 0.05 MG/ML IJ SOLN: 25.0000 ug | INTRAMUSCULAR | Status: DC | PRN

## 2014-11-04 MED ORDER — MORPHINE SULFATE 0.5 MG/ML IJ SOLN
INTRAMUSCULAR | Status: AC
  Filled 2014-11-04: qty 10

## 2014-11-04 MED ORDER — OXYTOCIN 10 UNIT/ML IJ SOLN
INTRAMUSCULAR | Status: AC
  Filled 2014-11-04: qty 4

## 2014-11-04 MED ORDER — OXYCODONE-ACETAMINOPHEN 5-325 MG PO TABS: 2.0000 | ORAL_TABLET | ORAL | Status: DC | PRN

## 2014-11-04 MED ORDER — FENTANYL CITRATE 0.05 MG/ML IJ SOLN
INTRAMUSCULAR | Status: DC | PRN
  Administered 2014-11-04: 20 ug via INTRAVENOUS
  Administered 2014-11-04: 10 ug via INTRATHECAL
  Administered 2014-11-04: 20 ug via INTRAVENOUS
  Administered 2014-11-04: 50 ug via INTRAVENOUS

## 2014-11-04 MED ORDER — OXYCODONE-ACETAMINOPHEN 5-325 MG PO TABS
1.0000 | ORAL_TABLET | ORAL | Status: DC | PRN
  Administered 2014-11-06 – 2014-11-07 (×3): 1 via ORAL
  Filled 2014-11-04 (×3): qty 1

## 2014-11-04 MED ORDER — KETOROLAC TROMETHAMINE 30 MG/ML IJ SOLN
30.0000 mg | Freq: Four times a day (QID) | INTRAMUSCULAR | Status: DC | PRN
  Administered 2014-11-04: 30 mg via INTRAVENOUS

## 2014-11-04 MED ORDER — FERROUS SULFATE 325 (65 FE) MG PO TABS
325.0000 mg | ORAL_TABLET | Freq: Two times a day (BID) | ORAL | Status: DC
  Administered 2014-11-05 – 2014-11-07 (×4): 325 mg via ORAL
  Filled 2014-11-04 (×5): qty 1

## 2014-11-04 MED ORDER — LACTATED RINGERS IV SOLN: INTRAVENOUS | Status: DC

## 2014-11-04 MED ORDER — OXYTOCIN 40 UNITS IN LACTATED RINGERS INFUSION - SIMPLE MED
62.5000 mL/h | INTRAVENOUS | Status: DC
Start: 2014-11-04 — End: 2014-11-05

## 2014-11-04 MED ORDER — WITCH HAZEL-GLYCERIN EX PADS: 1.0000 "application " | MEDICATED_PAD | CUTANEOUS | Status: DC | PRN

## 2014-11-04 MED ORDER — BUPIVACAINE HCL (PF) 0.25 % IJ SOLN
INTRAMUSCULAR | Status: DC | PRN
  Administered 2014-11-04: 10 mL

## 2014-11-04 MED ORDER — PHENYLEPHRINE 8 MG IN D5W 100 ML (0.08MG/ML) PREMIX OPTIME
INJECTION | INTRAVENOUS | Status: AC
  Filled 2014-11-04: qty 100

## 2014-11-04 SURGICAL SUPPLY — 44 items
CLAMP CORD UMBIL (MISCELLANEOUS) IMPLANT
CLEANER TIP ELECTROSURG 2X2 (MISCELLANEOUS) ×2 IMPLANT
CLOTH BEACON ORANGE TIMEOUT ST (SAFETY) ×2 IMPLANT
CONTAINER PREFILL 10% NBF 15ML (MISCELLANEOUS) IMPLANT
DECANTER SPIKE VIAL GLASS SM (MISCELLANEOUS) ×2 IMPLANT
DRAPE SHEET LG 3/4 BI-LAMINATE (DRAPES) IMPLANT
DRSG OPSITE POSTOP 4X10 (GAUZE/BANDAGES/DRESSINGS) ×2 IMPLANT
DURAPREP 26ML APPLICATOR (WOUND CARE) ×2 IMPLANT
ELECT REM PT RETURN 9FT ADLT (ELECTROSURGICAL) ×2
ELECTRODE REM PT RTRN 9FT ADLT (ELECTROSURGICAL) ×1 IMPLANT
EXTRACTOR VACUUM M CUP 4 TUBE (SUCTIONS) IMPLANT
GLOVE BIO SURGEON STRL SZ 6.5 (GLOVE) ×2 IMPLANT
GLOVE BIO SURGEON STRL SZ7 (GLOVE) ×2 IMPLANT
GLOVE BIOGEL PI IND STRL 7.0 (GLOVE) ×1 IMPLANT
GLOVE BIOGEL PI INDICATOR 7.0 (GLOVE) ×1
GLOVE INDICATOR 7.0 STRL GRN (GLOVE) ×4 IMPLANT
GOWN STRL REUS W/TWL LRG LVL3 (GOWN DISPOSABLE) ×4 IMPLANT
KIT ABG SYR 3ML LUER SLIP (SYRINGE) ×2 IMPLANT
LIQUID BAND (GAUZE/BANDAGES/DRESSINGS) ×2 IMPLANT
NEEDLE HYPO 22GX1.5 SAFETY (NEEDLE) ×2 IMPLANT
NEEDLE HYPO 25X5/8 SAFETYGLIDE (NEEDLE) ×2 IMPLANT
PACK C SECTION WH (CUSTOM PROCEDURE TRAY) ×2 IMPLANT
PAD ABD 8X7 1/2 STERILE (GAUZE/BANDAGES/DRESSINGS) ×2 IMPLANT
PAD OB MATERNITY 4.3X12.25 (PERSONAL CARE ITEMS) ×2 IMPLANT
RTRCTR C-SECT PINK 25CM LRG (MISCELLANEOUS) ×2 IMPLANT
SPONGE GAUZE 4X4 12PLY STER LF (GAUZE/BANDAGES/DRESSINGS) ×2 IMPLANT
STAPLER VISISTAT 35W (STAPLE) IMPLANT
SUT MNCRL AB 3-0 PS2 27 (SUTURE) ×2 IMPLANT
SUT PLAIN 0 NONE (SUTURE) IMPLANT
SUT PLAIN 2 0 (SUTURE) ×1
SUT PLAIN ABS 2-0 CT1 27XMFL (SUTURE) ×1 IMPLANT
SUT VIC AB 0 CT1 27 (SUTURE) ×2
SUT VIC AB 0 CT1 27XBRD ANBCTR (SUTURE) ×2 IMPLANT
SUT VIC AB 0 CTX 36 (SUTURE) ×3
SUT VIC AB 0 CTX36XBRD ANBCTRL (SUTURE) ×3 IMPLANT
SUT VIC AB 2-0 CT1 27 (SUTURE) ×1
SUT VIC AB 2-0 CT1 TAPERPNT 27 (SUTURE) ×1 IMPLANT
SUT VIC AB 3-0 SH 27 (SUTURE)
SUT VIC AB 3-0 SH 27X BRD (SUTURE) IMPLANT
SUT VIC AB 4-0 PS2 27 (SUTURE) ×2 IMPLANT
SYR CONTROL 10ML LL (SYRINGE) ×2 IMPLANT
SYRINGE 60CC LL (MISCELLANEOUS) ×2 IMPLANT
TOWEL OR 17X24 6PK STRL BLUE (TOWEL DISPOSABLE) ×2 IMPLANT
TRAY FOLEY CATH 14FR (SET/KITS/TRAYS/PACK) ×2 IMPLANT

## 2014-11-04 NOTE — Op Note (Signed)
Preoperative diagnosis: Intrauterine pregnancy at 37 weeks and 2 days                                             H/O Myomectomies                                            Pregnancy Induced Hypertension                                            Small for Gestational Age  Post operative diagnosis: Same                                               Submucosal myoma at incision                                               Areas of Placental Infarction  Anesthesia: Spinal  Anesthesiologist: Dr. Lyndle Herrlich  Procedure: Primary low transverse cesarean section                      Myomectomy of SM myoma at incision  Surgeon: Dr. [name]  Assistant: Julianne Handler   Estimated blood loss: 600 cc  Procedure:  After being informed of the planned procedure and possible complications including bleeding, infection, injury to other organs, informed consent is obtained. The patient is taken to OR #9 and given spinal anesthesia without complication. She is placed in the dorsal decubitus position with the pelvis tilted to the left. She is then prepped and draped in a sterile fashion. A Foley catheter is inserted in her bladder.  After assessing adequate level of anesthesia, we infiltrate the suprapubic area with 20 cc of Marcaine 0.25 and perform a Pfannenstiel incision which is brought down sharply to the fascia. The fascia is entered in a low transverse fashion. Linea alba is dissected. Peritoneum is entered in a midline fashion. An Alexis retractor is easily positioned. Visceral peritoneum is entered in a low transverse fashion allowing [place] to safely retract bladder by developing a bladder flap.  The myometrium is then entered in a low transverse fashion; first with knife and then extended bluntly. Amniotic fluid is decreased but clear. We assist the birth of a female  infant in cephalic presentation. Mouth and nose are suctioned. The baby is delivered. The cord is clamped and sectioned.  The baby is given to the neonatologist present in the room.  10 cc of blood is drawn from the umbilical vein.  A cord PH is done.The placenta is allowed to deliver spontaneously. It is complete and the cord has 3 vessels.  Areas of infarction are present. Uterine revision is negative.  A 3 cm submucosal myoma is present at the right side of the incision.  A myomectomy is easily perform simply by traction on the myoma with cautherization at the base with the Bovie.  Good hemostasis.  We proceed with closure of the myometrium in 2 layers: First with a running locked suture of 0 Vicryl, then with a Lembert suture of 0 Vicryl imbricating the first one. Hemostasis is completed with cauterization on peritoneal edges.  Both paracolic gutters are cleaned. Both tubes and ovaries are assessed and normal.  We confirm a satisfactory hemostasis.  Retractors and sponges are removed. Under fascia hemostasis is completed with cauterization.  The parietal peritoneum is closed with a running suture of Vicryl 2-0. The fascia is then closed with 2 running sutures of 0 Vicryl meeting midline. Hemostasis is completed with cauterization.  The adipose tissue is reapproximated with a Plain 2-0. The skin is closed with a subcuticular suture of 3-0 Monocryl and Dermabond.  A Honeycomb dressing is added.  Pressure dressing.  Instrument and sponge count is complete x2. Estimated blood loss is 600 cc.  The procedure is well tolerated by the patient who is taken to recovery room in a well and stable condition.  female baby named Danne Baxter was born at 14:50 and received an Apgar of 9  at 1 minute and 9 at 5 minutes.    Specimen: Placenta sent to Patho.   Anvika Gashi,MARIE-LYNE MD 1/26/20163:49 PM

## 2014-11-04 NOTE — Transfer of Care (Signed)
Immediate Anesthesia Transfer of Care Note  Patient: Pamela [name]  Procedure(s) Performed: Procedure(s) with comments: CESAREAN SECTION (N/A) - EDD: 11/23/14 MYOMECTOMY (N/A)  Patient Location: PACU  Anesthesia Type:Spinal  Level of Consciousness: awake  Airway & Oxygen Therapy: Patient Spontanous Breathing  Post-op Assessment: Report given to PACU RN  Post vital signs: Reviewed and stable  Complications: No apparent anesthesia complications

## 2014-11-04 NOTE — Lactation Note (Signed)
This note was copied from the chart of Pamela Keimora Swartout. Lactation Consultation Note  Patient Name: Pamela [name] [id number] Date: 11/04/2014 Reason for consult: Initial assessment;Infant < 6lbs;Other (Comment) (IUGR born at 51 weeks 2 days) Mom is planning to breastfeed her newborn but he was born as an early term IUGR infant weighing under 5 lbs.  Baby has received several formula feedings although blood sugars have been wnl.  Pediatrician recommends supplement at q3h feedings and mom has started using DEBP (set-up by RN, Janett Billow).  Mom had recently attempted to latch baby but when he did not latch, she fed baby formula and then pumped several ml's of ebm with DEBP.  LC reviewed pumping frequency and milk storage with parents.  LC encouraged frequent STS and provided LPI handout for information regarding special feeding needs due to birthweight. Mom encouraged to feed baby 8-12 times/24 hours and with feeding cues. LC encouraged review of Baby and Me pp 9, 14 and 20-25 for STS and BF information. LC provided [employer] brochure and reviewed [facility] services and list of community and web site resources.    Maternal Data Formula Feeding for Exclusion: Yes Reason for exclusion: Mother's choice to formula and breast feed on admission (MD order to supplement) Has patient been taught Hand Expression?: Yes (by RN) Does the patient have breastfeeding experience prior to this delivery?: No  Feeding Feeding Type: Breast Fed Nipple Type: Slow - flow  LATCH Score/Interventions            No latch yet; fed formula at 3 feedings and is 6 hours pp          Lactation Tools Discussed/Used Pump Review: Setup, frequency, and cleaning;Milk Storage (started by RN, Janett Billow and reinforced by [facility]) Initiated by:: RN Date initiated:: 11/04/14 STS, cue feedings, hand expression, LPI handout  Consult Status Consult Status: Follow-up Date: 11/05/14 Follow-up type: In-patient    Junious Dresser  [facility] 11/04/2014, 9:41 PM

## 2014-11-04 NOTE — H&P (Signed)
Pamela [name] is a 44 y.o. female G2P0010 [redacted]w[redacted]d presenting for C/S re H/O Robotic myomectomy.  HPP:  AMA 44 yo.  Egg donor.  PIH on Labetalol.  SGA.    OB History    Gravida Para Term Preterm AB TAB SAB Ectopic Multiple Living   2    1 1          Past Medical History  Diagnosis Date  . Genital warts     removed  . Recurrent UTI (urinary tract infection)   . Bleeding in early pregnancy 03/29/2014  . Termination of pregnancy (fetus)     x 1  . Newborn product of in vitro fertilization (IVF) pregnancy   . Dysrhythmia     resolved- "grew out of it" - no problem since teenager  . Hypertension 2016    [place] with 2016 pregnancy  . GERD (gastroesophageal reflux disease)     with 2016 pregnancy   Past Surgical History  Procedure Laterality Date  . Bladder surgery  1992    Widened the urethra  . Tubal ligation    . Myomectomy  2008  . Myomectomy  2014    Removed tubes bilaterally  . Wisdom tooth extraction     Family History: family history is not on file. Social History:  reports that she quit smoking about 7 years ago. Her smoking use included Cigarettes. She has a 20 pack-year smoking history. She has never used smokeless tobacco. She reports that she drinks alcohol. She reports that she uses illicit drugs (Marijuana and Cocaine).  Current facility-administered medications:  .  ceFAZolin (ANCEF) IVPB 2 g/50 mL premix, 2 g, Intravenous, On Call to OR, [name], MD Allergies  Allergen Reactions  . Augmentin [Amoxicillin-Pot Clavulanate] Nausea And Vomiting  . Erythromycin Nausea And Vomiting      Blood pressure 137/96, pulse 56, resp. rate 18, last menstrual period 11/22/2013, SpO2 99 %. Exam Physical Exam   [place] 10/30/14 BPP 8/8, AFI 10.6 cm, Dopplers wnl.  HPP:  Patient Active Problem List   Diagnosis Date Noted  . Bleeding in early pregnancy 03/29/2014    Prenatal labs: ABO, Rh: --/--/O POS (01/25 1545) Antibody: NEG (01/25 1545) Rubella:  Immune RPR:  Non Reactive (01/25 1545)  HBsAg: Negative (07/17 0000)  HIV: Non-reactive (07/17 0000)  Genetic testing: Chromosomes wnl before transfer of Embryo. [place] anato: wnl 1 hr GTT: Abnormal.  3 hr GTT intolerance to Glucose. GBS:  neg   Assessment/Plan: 37 + wks previous Robotic Myomectomy with PIH on Labetalol/SGA for C/S.  Surgery and risks reviewed.   Pamela [name],MARIE-LYNE 11/04/2014, 1:06 PM

## 2014-11-04 NOTE — Anesthesia Procedure Notes (Signed)
Spinal Patient location during procedure: OR Start time: 11/04/2014 2:20 PM End time: 11/04/2014 2:25 PM Staffing Anesthesiologist: Milana Obey Performed by: anesthesiologist  Preanesthetic Checklist Completed: patient identified, site marked, surgical consent, pre-op evaluation, timeout performed, IV checked, risks and benefits discussed and monitors and equipment checked Spinal Block Patient position: sitting Prep: ChloraPrep Patient monitoring: continuous pulse ox, blood pressure and heart rate Approach: midline Location: L3-4 Injection technique: single-shot Needle Needle type: Sprotte  Needle gauge: 24 G Needle length: 9 cm Assessment Sensory level: T4 Additional Notes Functioning IV was confirmed and monitors were applied. Sterile prep and drape, including hand hygiene, mask and sterile gloves were used. The patient was positioned and the spine was prepped. The skin was anesthetized with lidocaine.  Free flow of clear CSF was obtained prior to injecting local anesthetic into the CSF.  The spinal needle aspirated freely following injection.  The needle was carefully withdrawn.  The patient tolerated the procedure well. Consent was obtained prior to procedure with all questions answered and concerns addressed. Risks including but not limited to bleeding, infection, nerve damage, paralysis, failed block, inadequate analgesia, allergic reaction, high spinal, itching and headache were discussed and the patient wished to proceed.   Lauretta Grill, MD

## 2014-11-04 NOTE — Anesthesia Postprocedure Evaluation (Signed)
  Anesthesia Post-op Note  Patient: Pamela [name]  Procedure(s) Performed: Procedure(s) (LRB): CESAREAN SECTION (N/A) MYOMECTOMY (N/A)  Patient Location: PACU  Anesthesia Type: Spinal  Level of Consciousness: awake and alert   Airway and Oxygen Therapy: Patient Spontanous Breathing  Post-op Pain: mild  Post-op Assessment: Post-op Vital signs reviewed, Patient's Cardiovascular Status Stable, Respiratory Function Stable, Patent Airway and No signs of Nausea or vomiting  Last Vitals:  Filed Vitals:   11/04/14 1745  BP:   Pulse: 46  Temp: 36.4 C  Resp: 15    Post-op Vital Signs: stable   Complications: No apparent anesthesia complications

## 2014-11-04 NOTE — Consult Note (Signed)
Neonatology Note:   Attendance at C-section:    I was asked by Dr. Dellis Filbert to attend this primary C/S at 37 2/7 weeks due to previous myomectomy. The mother is a G2P0A1 O pos, GBS neg with known IUGR infant and gestational HTN, on Labetalol. ROM at delivery, fluid clear. Infant vigorous with good spontaneous cry and tone. Needed only minimal bulb suctioning. Ap 9/9. Lungs clear to ausc in DR. Weight 2140 grams in DR. Spoke with parents about risk for hypothermia and hypoglycemia in this SGA infant, but can allow him to be on CN status unless he has problems. To CN to care of Pediatrician.   Real Cons, MD

## 2014-11-05 ENCOUNTER — Encounter (HOSPITAL_COMMUNITY): Payer: Self-pay | Admitting: Obstetrics & Gynecology

## 2014-11-05 LAB — CBC
HCT: 34.6 % — ABNORMAL LOW (ref 36.0–46.0)
Hemoglobin: 11.6 g/dL — ABNORMAL LOW (ref 12.0–15.0)
MCH: 29.9 pg (ref 26.0–34.0)
MCHC: 33.5 g/dL (ref 30.0–36.0)
MCV: 89.2 fL (ref 78.0–100.0)
Platelets: 181 10*3/uL (ref 150–400)
RBC: 3.88 MIL/uL (ref 3.87–5.11)
RDW: 14.1 % (ref 11.5–15.5)
WBC: 12.7 10*3/uL — ABNORMAL HIGH (ref 4.0–10.5)

## 2014-11-05 NOTE — Progress Notes (Signed)
Patient ID: Pamela [name], female   DOB: August 08, 1971, 44 y.o.   MRN: [id number] Subjective: POD# 1 Information for the patient's newborn:  Mena, Simonis [[id number]]  female  / circ planning Friday morning  Reports feeling a little sore Feeding: breast and bottle Patient reports tolerating PO.  Breast symptoms: none Pain controlled with ibuprofen (OTC) Denies HA/SOB/C/P/N/V/dizziness. Flatus absent. She reports vaginal bleeding as normal, without clots.  She is ambulating, urinating without difficult.     Objective:   VS:  Filed Vitals:   11/04/14 2130 11/05/14 0030 11/05/14 0420 11/05/14 0815  BP: 121/78  99/66 107/63  Pulse: 50  51 52  Temp: 98.8 F (37.1 C) 99 F (37.2 C) 99 F (37.2 C) 98.6 F (37 C)  TempSrc: Oral Oral Oral Oral  Resp: 18 18 18 20   Height:      Weight:      SpO2: 98% 100% 97%      Intake/Output Summary (Last 24 hours) at 11/05/14 1508 Last data filed at 11/05/14 0525  Gross per 24 hour  Intake   1420 ml  Output    230 ml  Net   1190 ml        Recent Labs  11/03/14 1545 11/05/14 0605  WBC 11.3* 12.7*  HGB 13.1 11.6*  HCT 38.4 34.6*  PLT 197 181     Blood type: O POS (01/25 1545)  Rubella: Immune (07/17 0000)     Physical Exam:  General: alert, cooperative and no distress CV: Regular rate and rhythm, S1S2 present or without murmur or extra heart sounds Resp: clear Abdomen: soft, nontender, normal bowel sounds Incision: Tegaderm and Honeycomb C/D/I - skin well-approximated with sutures Uterine Fundus: firm, 1 FB below umbilicus, nontender Lochia: minimal Ext: extremities normal, atraumatic, no cyanosis or edema and Homans sign is negative, no sign of DVT   Assessment/Plan: 44 y.o.   POD# 1.  s/p Cesarean Delivery.  Indications: Previous Robotic Myomectomy                Principal Problem:   Postpartum care following cesarean delivery (1/26) Active Problems:   Postoperative state  Doing well, stable.                Regular diet as tolerated Ambulate Routine post-op care  Graceann Congress, MSN, CNM 11/05/2014, 3:08 PM

## 2014-11-05 NOTE — Anesthesia Postprocedure Evaluation (Signed)
  Anesthesia Post-op Note  Patient: Pamela [name]  Procedure(s) Performed: Procedure(s) with comments: CESAREAN SECTION (N/A) - EDD: 11/23/14 MYOMECTOMY (N/A)  Patient Location: Mother/Baby  Anesthesia Type:Spinal  Level of Consciousness: awake, alert  and oriented  Airway and Oxygen Therapy: Patient Spontanous Breathing  Post-op Pain: none  Post-op Assessment: Post-op Vital signs reviewed, Patient's Cardiovascular Status Stable, Respiratory Function Stable, No headache, No backache, No residual numbness and No residual motor weakness  Post-op Vital Signs: reviewed/stable  Last Vitals:  Filed Vitals:   11/05/14 0815  BP: 107/63  Pulse: 52  Temp: 37 C  Resp: 20    Complications: No apparent anesthesia complications

## 2014-11-05 NOTE — Progress Notes (Signed)
Clinical Social Work Department PSYCHOSOCIAL ASSESSMENT - MATERNAL/CHILD 11/05/2014  Patient:  [name],[name]  Account Number:  [id number]  Admit Date:  11/04/2014  Childs Name:   Oliver   Clinical Social Worker:  Horris Speros, CLINICAL SOCIAL WORKER   Date/Time:  11/05/2014 10:30 AM  Date Referred:  11/04/2014   Referral source  Central Nursery     Referred reason  Other - Clarify substance use history   Other referral source:    I:  FAMILY / HOME ENVIRONMENT Child's legal guardian:  PARENT  Guardian - Name Guardian - Age Guardian - Address  Pamela [name] 43 [address] [place], [place] [address]  Pamela [name]  same as above   Other household support members/support persons Other support:   MOB endorsed strong family support.    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   Financial resources:  Private Insurance If Medicaid - County:    School / Grade:  N/A Maternity Care Coordinator / Child Services Coordination / Early Interventions:   None reported  Cultural issues impacting care:   None reported    III  STRENGTHS Strengths  Adequate Resources  Home prepared for Child (including basic supplies)  Supportive family/friends   Strength comment:    IV  RISK FACTORS AND CURRENT PROBLEMS Current Problem:  YES   Risk Factor & Current Problem Patient Issue Family Issue Risk Factor / Current Problem Comment  Substance Abuse N N Extensive substance abuse history more than 20 years ago.  MOB denied any substance use during pregnancy. Baby's UDS is negative.    V  SOCIAL WORK ASSESSMENT CSW met with the MOB in order to clarify substance use history.  It has been documented that MOB has history of extensive substance use more than 20 years ago, but in other location, documentation indicated more recent use.  MOB presented as receptive to the  visit.  She was easily engaged, was in a pleasant mood, and displayed a full range in affect.    MOB expressed excitement as she transitions into the postpartum period.  She discussed that it continues to feel surreal, but stated that it has been a much anticipated transition to the postpartum period.  MOB discussed strong support from family and employer, and denied any stressors that may negatively her transition into the postpartum period.  MOB reported history of depression more than 5 years ago, but denied any recent symptoms or symptoms during her pregnancy.  MOB presented as receptive to education on postpartum depression and agreed to contact her MD if she notes symptoms.  CSW discussed that MOB presents with low risk for developing postpartum depression, but emphasized need to closely monitor mood and feelings since it may occur to anyone.  MOB verbalized understanding.   MOB openly discussed substance use history.  She reported history more than 20 years ago "while I was in college".  She reported that she used "a whole bunch of stuff", but denied any use since college.  MOB expressed understanding of hospital drug screen policy since it had been unclear in medical records when exact use was.  MOB denied additional questions or concerns related to drug screen policy.    No barriers to discharge.  VI SOCIAL WORK PLAN Social Work Plan  Patient/Family Education  No Further Intervention Required / No Barriers to Discharge   Type of pt/family education:   Postpartum   depression  Hospital drug screen policy   If child protective services report - county:  N/A If child protective services report - date:  N/A Information/referral to community resources comment:   No referrals needed at this time.   Other social work plan:   CSW to follow up as needed or upon family request.      

## 2014-11-05 NOTE — Addendum Note (Signed)
Addendum  created 11/05/14 1328 by Jonna Munro, CRNA   Modules edited: Notes Section   Notes Section:  File: [id number]

## 2014-11-06 LAB — COMPREHENSIVE METABOLIC PANEL
ALT: 22 U/L (ref 0–35)
AST: 38 U/L — ABNORMAL HIGH (ref 0–37)
Albumin: 3.1 g/dL — ABNORMAL LOW (ref 3.5–5.2)
Alkaline Phosphatase: 124 U/L — ABNORMAL HIGH (ref 39–117)
Anion gap: 5 (ref 5–15)
BUN: 10 mg/dL (ref 6–23)
CO2: 24 mmol/L (ref 19–32)
Calcium: 8.6 mg/dL (ref 8.4–10.5)
Chloride: 111 mmol/L (ref 96–112)
Creatinine, Ser: 0.8 mg/dL (ref 0.50–1.10)
GFR calc Af Amer: >90 mL/min (ref >90)
GFR calc non Af Amer: 88 mL/min — ABNORMAL LOW (ref >90)
Glucose, Bld: 92 mg/dL (ref 70–99)
Potassium: 4.2 mmol/L (ref 3.5–5.1)
Sodium: 140 mmol/L (ref 135–145)
Total Bilirubin: 0.6 mg/dL (ref 0.3–1.2)
Total Protein: 6.5 g/dL (ref 6.0–8.3)

## 2014-11-06 LAB — CBC
HCT: 35.1 % — ABNORMAL LOW (ref 36.0–46.0)
Hemoglobin: 11.7 g/dL — ABNORMAL LOW (ref 12.0–15.0)
MCH: 29.8 pg (ref 26.0–34.0)
MCHC: 33.3 g/dL (ref 30.0–36.0)
MCV: 89.5 fL (ref 78.0–100.0)
Platelets: 211 10*3/uL (ref 150–400)
RBC: 3.92 MIL/uL (ref 3.87–5.11)
RDW: 14.4 % (ref 11.5–15.5)
WBC: 11.5 10*3/uL — ABNORMAL HIGH (ref 4.0–10.5)

## 2014-11-06 LAB — URIC ACID: Uric Acid, Serum: 6.6 mg/dL (ref 2.4–7.0)

## 2014-11-06 MED ORDER — CALCIUM CARBONATE ANTACID 500 MG PO CHEW
1.0000 | CHEWABLE_TABLET | ORAL | Status: DC | PRN
  Administered 2014-11-07: 200 mg via ORAL
  Filled 2014-11-06: qty 1

## 2014-11-06 MED ORDER — HYDROCHLOROTHIAZIDE 12.5 MG PO CAPS
12.5000 mg | ORAL_CAPSULE | Freq: Every day | ORAL | Status: DC
  Administered 2014-11-06 – 2014-11-07 (×2): 12.5 mg via ORAL
  Filled 2014-11-06 (×3): qty 1

## 2014-11-06 MED ORDER — LABETALOL HCL 200 MG PO TABS
200.0000 mg | ORAL_TABLET | Freq: Two times a day (BID) | ORAL | Status: DC
  Administered 2014-11-06: 200 mg via ORAL
  Filled 2014-11-06 (×2): qty 1

## 2014-11-06 NOTE — Progress Notes (Signed)
Spoke to [employer], CNM, and reported blood pressure of 164/94.  Pt  c/o abdominal pain at a 7 out of 10.  Medicated at this time.  Blood pressure will be rechecked at 1930 and reported back to [employer].

## 2014-11-06 NOTE — Lactation Note (Signed)
This note was copied from the chart of Pamela [name]. Lactation Consultation Note: Follow up visit with mom She has just given formula- offered assist with latch and mom wants to try while I am here. Attempted to latch - baby sleepy- took a few sucks. Mom easily able to hand express. Baby licked it off and then off to sleep. Encouraged to try to breast feed first then give formula. Mom reports she has pumped once this morning. Suggested pumping now. Mom has been turning the pump suction up as high as it will go and reports it hurts. Used 27 flange and lower suction and mom reports that feels better, Obtaining whitish milk. Encouraged to feed this to baby  At next feeding. Reviewed cleaning of pump pieces. Encouraged pumping q 3 hours after nursing. No questions at present. To call for assist prn  Patient Name: Pamela [name] Date: 11/06/2014 Reason for consult: Follow-up assessment   Maternal Data Formula Feeding for Exclusion: No  Feeding Feeding Type: Breast Fed  LATCH Score/Interventions Latch: Too sleepy or reluctant, no latch achieved, no sucking elicited. (few sucks)  Audible Swallowing: None  Type of Nipple: Everted at rest and after stimulation  Comfort (Breast/Nipple): Soft / non-tender     Hold (Positioning): Assistance needed to correctly position infant at breast and maintain latch. Intervention(s): Breastfeeding basics reviewed  LATCH Score: 5  Lactation Tools Discussed/Used     Consult Status Consult Status: Follow-up Date: 11/07/14 Follow-up type: In-patient    Truddie Crumble 11/06/2014, 2:13 PM

## 2014-11-06 NOTE — Progress Notes (Signed)
POSTOPERATIVE DAY # 2 S/P C/S for hx. Of Myomectomy    S:         Reports feeling good             Tolerating po intake / no nausea / no vomiting / no flatus yet / no BM             Bleeding is light             No HA/dizziness/epigastric pain   Pain controlled with Motrin and Percocet             Up ad lib / ambulatory/ voiding QS  Newborn bottle feeding - but plans to breast feed with milk supply comes in / Circumcision planning on Friday 1/29   O:  VS: BP 138/86 mmHg  Pulse 58  Temp(Src) 98 F (36.7 C) (Oral)  Resp 18  Ht 5\' 8"  (1.727 m)  Wt 102.059 kg (225 lb)  BMI 34.22 kg/m2  SpO2 98%  LMP 11/22/2013  Breastfeeding? Unknown   LABS:               Recent Labs  11/03/14 1545 11/05/14 0605  WBC 11.3* 12.7*  HGB 13.1 11.6*  PLT 197 181               Bloodtype: --/--/O POS (01/25 1545)  Rubella: Immune (07/17 0000)                                             I&O: Intake/Output      01/27 0701 - 01/28 0700 01/28 0701 - 01/29 0700   P.O. 1500    I.V. (mL/kg) 375 (3.7)    Total Intake(mL/kg) 1875 (18.4)    Urine (mL/kg/hr) 800 (0.3)    Blood     Total Output 800     Net +1075                       Physical Exam:             Alert and Oriented X3  Lungs: Clear and unlabored  Heart: regular rate and rhythm / no mumurs  Abdomen: soft, non-tender, non-distended, +BS             Fundus: firm, non-tender, U-2             Dressing: Honeycomb dressing clean, dry, intact              Incision:  approximated with sutures and dermabond / no erythema / + ecchymosis present over mons pubis extending into labia / no drainage / slight edema present over incision  Perineum: intact  Lochia: light  Extremities: trace dependent edema - R ankle > L ankle, no calf pain or tenderness, negative Homans, +2DTRs, No Clonus  A:        POD # 2 S/P C/S for hx. Of myomectomy            Mild ABL Anemia - stable on Ferrous Sulfate  Gestational Hypertension - BP's stable - off Labetalol  130s/80s  Doing well - stable  P:        Routine postoperative care              Ambulate in halls today  Anticipate discharge home tomorrow   [name], [place]

## 2014-11-06 NOTE — Progress Notes (Signed)
TC from nurse regarding BP / reviewed BP and status  VS: BP 145/95 mmHg  Pulse 59  Temp(Src) 98.2 F (36.8 C) (Oral)  Resp 18  Ht 5\' 8"  (1.727 m)  Wt 102.059 kg (225 lb)  BMI 34.22 kg/m2  SpO2 100%  LMP 11/22/2013  Breastfeeding? Unknown  BP range: 145/95 - 164/94 - 149/86 - 138/86 - 164/95  A:       Elevated BP - asymptomatic PIH           (+) dependent edema           HX gestational hypertension / obesity   P:        PIH labs stat             Restart Labetalol 200mg  BID now             Add HCTZ 12.5 daily             Monitor BP Q 4 hours tonight   Artelia Laroche CNM, MSN, [facility] 11/06/2014, 8:57 PM

## 2014-11-06 NOTE — Progress Notes (Signed)
Patient requests order for Tums prn

## 2014-11-06 NOTE — Lactation Note (Signed)
This note was copied from the chart of Pamela Caralynn Gelber. Lactation Consultation Note Mom given 22 cal. Similac Neosure d/t low weight and mom plans on Breast and bottle. Hasn't pumped much. Has mainly bottle fed. Keeps telling RN she wants to BF just not right now, will at later time after feeling better. I spoke with mom  About needing more calories for baby and changing formula. Mom explained to me baby has been fussy and she is wore out, mom was holding baby. I swaddled baby and laid in bassenette.mom said she planned on bottle feeding tonight until later in morning and wasn't going to pump until then because she was tired. Explained if she needed assistance to call. Staff is here to help her in what ever she chooses to do. Has been explained about pumping and protecting milk supply. Patient Name: Pamela [name] [id number] Date: 11/06/2014 Reason for consult: Infant < 6lbs   Maternal Data    Feeding Feeding Type: Formula Nipple Type: Slow - flow Length of feed: 5 min  LATCH Score/Interventions Latch: Repeated attempts needed to sustain latch, nipple held in mouth throughout feeding, stimulation needed to elicit sucking reflex. Intervention(s): Skin to skin;Teach feeding cues;Waking techniques Intervention(s): Adjust position;Assist with latch  Audible Swallowing: Spontaneous and intermittent (with formula in nipple shield)  Type of Nipple: Flat Intervention(s):  (nipple shield)  Comfort (Breast/Nipple): Soft / non-tender     Hold (Positioning): Assistance needed to correctly position infant at breast and maintain latch. Intervention(s): Breastfeeding basics reviewed;Support Pillows;Position options;Skin to skin  LATCH Score: 7  Lactation Tools Discussed/Used     Consult Status Consult Status: Follow-up Date: 11/06/14 Follow-up type: In-patient    Theodoro Kalata 11/06/2014, 2:31 AM

## 2014-11-07 DIAGNOSIS — O133 Gestational [pregnancy-induced] hypertension without significant proteinuria, third trimester: Secondary | ICD-10-CM | POA: Diagnosis present

## 2014-11-07 MED ORDER — HYDRALAZINE HCL 10 MG PO TABS
5.0000 mg | ORAL_TABLET | Freq: Once | ORAL | Status: AC
  Administered 2014-11-07: 5 mg via ORAL
  Filled 2014-11-07: qty 1

## 2014-11-07 MED ORDER — HYDROCHLOROTHIAZIDE 12.5 MG PO CAPS: 12.5000 mg | ORAL_CAPSULE | Freq: Every day | ORAL | Status: DC

## 2014-11-07 MED ORDER — IBUPROFEN 600 MG PO TABS
600.0000 mg | ORAL_TABLET | Freq: Four times a day (QID) | ORAL | Status: DC
Start: 2014-11-07 — End: 2015-09-18

## 2014-11-07 MED ORDER — OXYCODONE-ACETAMINOPHEN 5-325 MG PO TABS: 1.0000 | ORAL_TABLET | ORAL | Status: DC | PRN

## 2014-11-07 MED ORDER — HYDRALAZINE HCL 10 MG PO TABS: 5.0000 mg | ORAL_TABLET | Freq: Every day | ORAL | Status: DC

## 2014-11-07 NOTE — Progress Notes (Signed)
Dr Dellis Filbert here at bedside and notified of increased BP 162/93 and 156/89. labetalol held this am due to decreased pulse. New order for Apresoline 5mg  to start now and as prescription medication past discharge.

## 2014-11-07 NOTE — Progress Notes (Signed)
Pamela [name] notified of pule rate 2 X2 this Am. Instructed RN to hold labetalol for now. May give at next BP assessment if pulse >60.

## 2014-11-07 NOTE — Progress Notes (Signed)
Patient can take BP at home. Instructed to check again when settled at home. Call MD if  BP greater than 160/90 as advised by Dr Dellis Filbert. Apresoline just initiated.Patient declined wheelchair to car, wanted to walk.

## 2014-11-07 NOTE — Discharge Summary (Signed)
POSTOPERATIVE DISCHARGE SUMMARY:  Patient ID: Pamela [name] MRN: [id number] DOB/AGE: [birth year] 44 y.o.  Admit date: 11/04/2014 Admission Diagnoses: 37.[redacted] weeks gestation, previous myomectomy   Discharge date: 11/07/2014 Discharge Diagnoses: S/P C/S on 11/04/14; gestational HTN, delivered        Prenatal history: G2P1011   EDC: 11/23/2014, by Rolene Arbour. Date oftransfer  Prenatal care at [facility] since [redacted] wks gestation. Primary provider: Dr. Dellis Filbert Prenatal course complicated by [facility], IVF pregnancy w/donor sperm-hx BTL, previous myomectomy, gestational HTN-on Labetalol  Prenatal labs: ABO, Rh: --/--/O POS (01/25 1545) / Antibody: NEG (01/25 1545) Rubella:   Immune RPR: Non Reactive (01/25 1545)  HBsAg: Negative (07/17 0000)  HIV: Non-reactive (07/17 0000)  GBS:   Neg GTT: 156, normal 3hr-borderline Genetic screen-normal  Medical / Surgical History :  Past medical history:  Past Medical History  Diagnosis Date  . Genital warts     removed  . Recurrent UTI (urinary tract infection)   . Bleeding in early pregnancy 03/29/2014  . Termination of pregnancy (fetus)     x 1  . Newborn product of in vitro fertilization (IVF) pregnancy   . Dysrhythmia     resolved- "grew out of it" - no problem since teenager  . Hypertension 2016    Lane with 2016 pregnancy  . GERD (gastroesophageal reflux disease)     with 2016 pregnancy    Past surgical history:  Past Surgical History  Procedure Laterality Date  . Bladder surgery  1992    Widened the urethra  . Tubal ligation    . Myomectomy  2008  . Myomectomy  2014    Removed tubes bilaterally  . Wisdom tooth extraction    . Cesarean section N/A 11/04/2014    Procedure: CESAREAN SECTION;  Surgeon: [name], MD;  Location: [facility];  Service: Obstetrics;  Laterality: N/A;  EDD: 11/23/14  . Myomectomy N/A 11/04/2014    Procedure: MYOMECTOMY;  Surgeon: [name], MD;  Location: [facility];  Service: Obstetrics;   Laterality: N/A;     Medications on Admission: Prescriptions prior to admission  Medication Sig Dispense Refill Last Dose  . acetaminophen (TYLENOL) 500 MG tablet Take 1,000 mg by mouth every 6 (six) hours as needed for headache.   Past Week at Unknown time  . calcium carbonate (TUMS - DOSED IN MG ELEMENTAL CALCIUM) 500 MG chewable tablet Chew 2 tablets by mouth 3 (three) times daily as needed for indigestion or heartburn.   Past Week at Unknown time  . labetalol (NORMODYNE) 200 MG tablet Take 200 mg by mouth 3 (three) times daily.   11/04/2014 at 1200  . Prenatal Vit-Fe Fumarate-FA (PRENATAL MULTIVITAMIN) TABS tablet Take 1 tablet by mouth daily at 12 noon.   Past Week at Unknown time    Allergies: Augmentin and Erythromycin   Intrapartum Course:  Admitted for scheduled primary CS under regional anesthesia   Postpartum Course: Complicated by elevated BP, started on HCTZ  Physical Exam:   VSS: Blood pressure 141/86, pulse 57, temperature 97.8 F (36.6 C), temperature source Oral, resp. rate 18, height 5\' 8"  (1.727 m), weight 102.059 kg (225 lb), last menstrual period 11/22/2013, SpO2 98 %, unknown if currently breastfeeding.  LABS:  Recent Labs  11/05/14 0605 11/06/14 2106  WBC 12.7* 11.5*  HGB 11.6* 11.7*  PLT 181 211    General: Alert and oriented x3 Heart: RRR Lungs: CTA bilaterally GI: soft, non-tender, non-distended, BS x4 Lochia: small Uterus: firm below umbilicus Incision: well  approximated; honeycomb dressing-no significant erythema, drainage, or edema Extremities: No edema, Homans neg   Newborn Data Live born female  Birth Weight: 4 lb 10.1 oz (2100 g) APGAR: 9, 9  See operative report for further details  Home with mother.  Discharge Instructions:  Wound Care: keep clean and dry / remove honeycomb POD 6 Postpartum Instructions: [facility] discharge booklet - instructions reviewed Medications:    Medication List    STOP taking these medications         acetaminophen 500 MG tablet  Commonly known as:  TYLENOL     calcium carbonate 500 MG chewable tablet  Commonly known as:  TUMS - dosed in mg elemental calcium     labetalol 200 MG tablet  Commonly known as:  NORMODYNE      TAKE these medications        hydrochlorothiazide 12.5 MG capsule  Commonly known as:  MICROZIDE  Take 1 capsule (12.5 mg total) by mouth daily.     ibuprofen 600 MG tablet  Commonly known as:  ADVIL,MOTRIN  Take 1 tablet (600 mg total) by mouth every 6 (six) hours.     oxyCODONE-acetaminophen 5-325 MG per tablet  Commonly known as:  PERCOCET/ROXICET  Take 1-2 tablets by mouth every 4 (four) hours as needed (for pain scale less than 7).     prenatal multivitamin Tabs tablet  Take 1 tablet by mouth daily at 12 noon.            Follow-up Information    Follow up with LAVOIE,MARIE-LYNE, MD. Schedule an appointment as soon as possible for a visit in 4 days.   Specialty:  Obstetrics and Gynecology   Why:  blood pressure check   Contact information:   [address] [place] [address] [phone]       Follow up with LAVOIE,MARIE-LYNE, MD. Schedule an appointment as soon as possible for a visit in 6 weeks.   Specialty:  Obstetrics and Gynecology   Contact information:   [place] [address] [phone]         Signed: Julianne Handler, Delane Ginger MSN, CNM 11/07/2014, 11:37 AM

## 2014-11-07 NOTE — Progress Notes (Signed)
Handout on signs and symptoms of pre-eclampsia given and explained to patient.

## 2014-11-07 NOTE — Progress Notes (Signed)
Informed CNM, Artelia Laroche of patient's blood pressure 145/95. Received new orders to assess BP every 4 hours, stat CMP, CBC, and uric acid obtained, and HCTZ & Labetalol started. Will continue to monitor patient.

## 2014-11-07 NOTE — Progress Notes (Signed)
POD # 3  Subjective: Pt reports feeling well, ready for discharge/ Pain controlled with Motrin and Percocet Tolerating po/Voiding without problems/ No n/v/ Flatus present No HA, visual disturbances, or epigastric pain Activity: ad lib Bleeding is light Newborn info:  Information for the patient's newborn:  Pamela [name], Pamela [name] [[id number]]  female Circumcision: planning/ Feeding: breast   Objective: VS: VS:  Filed Vitals:   11/07/14 0403 11/07/14 0750 11/07/14 1000 11/07/14 1022  BP: 129/74 146/84 141/86 141/86  Pulse: 67 57 57 57  Temp: 98 F (36.7 C) 97.8 F (36.6 C)    TempSrc: Oral Oral    Resp: 18 16  18   Height:      Weight:      SpO2: 98%       I&O: Intake/Output      01/28 0701 - 01/29 0700 01/29 0701 - 01/30 0700   P.O. 360    I.V. (mL/kg)     Total Intake(mL/kg) 360 (3.5)    Urine (mL/kg/hr) 375 (0.2)    Total Output 375     Net -15            LABS:  Recent Labs  11/05/14 0605 11/06/14 2106  WBC 12.7* 11.5*  HGB 11.6* 11.7*  PLT 181 211                           Physical Exam:  General: alert and cooperative CV: Regular rate and rhythm Resp: CTA bilaterally Abdomen: soft, nontender, normal bowel sounds Incision: healing well, no drainage, no erythema, no hernia, no seroma, no swelling, well approximated, honeycomb dsg c/d/i Uterine Fundus: firm, below umbilicus, nontender Lochia: minimal Ext: extremities normal, atraumatic, no cyanosis or edema and Homans sign is negative, no sign of DVT    Assessment: POD # 3/ G2P1011/ S/P C/Section d/t hx myomectomy  Gestational HTN, delivered-on Labetalol, improving Doing well and stable for discharge home  Plan: Discharge home D/C Labetalol-per Dr. Dellis Filbert RX's: Ibuprofen 600mg  po Q 6 hrs prn pain #30 Refill x 1 Percocet 5/325 1 - 2 tabs po every 4 hrs prn pain #30 Refill x 0 HCTZ 12.5 mg daily x5 days [facility] booklet given Monitor BP at home, call for >160/90 BP check early next week in  office    Signed: Julianne Handler, N, MSN, CNM 11/07/2014, 10:57 AM

## 2014-11-17 ENCOUNTER — Ambulatory Visit (HOSPITAL_COMMUNITY): Payer: PRIVATE HEALTH INSURANCE

## 2014-11-17 ENCOUNTER — Other Ambulatory Visit (HOSPITAL_COMMUNITY): Payer: PRIVATE HEALTH INSURANCE

## 2014-11-19 ENCOUNTER — Ambulatory Visit (HOSPITAL_COMMUNITY)
Admission: RE | Admit: 2014-11-19 | Discharge: 2014-11-19 | Disposition: A | Discharge status: Home / Self Care | Payer: PRIVATE HEALTH INSURANCE | Source: Ambulatory Visit | Attending: Obstetrics & Gynecology | Admitting: Obstetrics & Gynecology

## 2014-11-19 NOTE — Lactation Note (Signed)
Lactation Consult  Mother's reason for visit:  Consult  Visit Type:  Feeding assessment  Appointment Notes: mom may need a nipple shield for latching - confirmed  Consult:  Initial Lactation Consultant:  Pamela [name]  ________________________________________________________________________ Pamela [name] Name: Pamela [name] Date of Birth: 11/04/2016 Pediatrician: [facility] Gender: female Gestational Age: [redacted]w[redacted]d(At Birth) Birth Weight: 4 lb 10.1 oz (2100 g) Weight at Discharge:  Weight: (!) 4 lb 9.7 oz (2090 g) Date of Discharge: 11/07/2014 Filed Weights   11/06/14 0000 11/07/14 0029 11/07/14 1400  Weight: 4 lb 9.2 oz (2075 g) 4 lb 8.8 oz (2064 g) 4 lb 9.7 oz (2090 g)   Last weight taken from location [facility]:5-0 .5 oz ( per mom with clothes on )  Location:Pediatrician's office  Weight today:2486 g , 5-7.7 oz       ________________________________________________________________________  Mother's Name: Pamela DeterType of delivery:  C/Section -  Breastfeeding Experience: none per mom  Maternal Medical Conditions:  Pregnancy induced hypertension,  Maternal Medications:  PNV , B/P meds,   ________________________________________________________________________  Breastfeeding History (Post Discharge)  Frequency of breastfeeding: attempted once at home  Duration of feeding:   Supplementing : per mom feeding every 2-3 hours , volume 30 -60 ml ( Enfamil and EBM )   Pumping : ( DEBP Medela ) - every 3 hours days and evenings , none at night - 1.5 - 2oz ( 3-4 x's a day - 6 oz a day )   Infant Intake and Output Assessment  Voids:  9 in 24 hrs.  Color:  Clear yellow Stools: 7  in 24 hrs.  Color:  Brown  ________________________________________________________________________  Maternal Breast Assessment  Breast:  Soft Nipple:  Erect Pain level:  0 Pain interventions:  Expressed breast  milk  _______________________________________________________________________ Feeding Assessment/Evaluation  Initial feeding assessment: baby awake and rooting , per mom last feeding was at 1130 am 30 ml from a bottle   Infant's oral assessment:  WNL  Positioning:  Football Right breast  LATCH documentation: using a NS #20   Latch:  2 = Grasps breast easily, tongue down, lips flanged, rhythmical sucking.  Audible swallowing:  2 = Spontaneous and intermittent  Type of nipple:  2 = Everted at rest and after stimulation  Comfort (Breast/Nipple):  2 = Soft / non-tender  Hold (Positioning):  1 = Assistance needed to correctly position infant at breast and maintain latch  LATCH score:  9   Attached assessment:  Deep  Lips flanged:  Yes.    Lips untucked:  Yes.    Suck assessment:  Nutritive and Nonnutritive  Tools:  Nipple shield 20 mm Instructed on use and cleaning of tool:  Yes.    Pre-feed weight:  2486  g , 5-7.7 oz  Post-feed weight:  2506 g , 5-8.4 oz  Amount transferred:  16 ml  Amount supplemented:  4 ml   Additional Feeding Assessment -   Infant's oral assessment:  WNL  Positioning:  Football Left breast  LATCH documentation:  Latch:  2 = Grasps breast easily, tongue down, lips flanged, rhythmical sucking.  Audible swallowing:  2 = Spontaneous and intermittent  Type of nipple:  2 = Everted at rest and after stimulation  Comfort (Breast/Nipple):  2 = Soft / non-tender  Hold (Positioning):  1 = Assistance needed to correctly position infant at breast and maintain latch  LATCH score:  9   Attached assessment:  Deep  Lips flanged:  No.  Lips untucked:  Yes.    Suck assessment:  Nutritive and Nonnutritive  Tools:  Nipple shield 20 mm Instructed on use and cleaning of tool:  Yes.      Pre-feed weight: 2506 g , 5-8.4 oz  Post-feed weight: 2508 g , 5-8.5 oz  Amount transferred:  0  Amount supplemented:  16m ( formula instilled into the top of the NS )     Total amount pumped post feed:  Mom did not post pump at consult   Total amount transferred:  16 ml  Total supplement given:  36 ml of formula   Lactation Impression:  Baby has gained 7 oz since last weight check on 2/5 /16 , today is 5-7.7 oz  Per mom baby has only been to the breast once since it went home , and mom has a desire to re-latch and try a NS with latching Per mom has been pumping with a DEBP at home 3-4 's  Per  day for 15 - 20 mins with 45 - 60 ml volume EBM yield. Baby latches well both breast with #20 Nipple shield and able to stay in a consistent pattern , but due to mom's breast not having consistent  stimulation milk supply has decreased.   Therefore will have to continue giving the baby the opportunity to feed at the breast , but in order for this baby's  Nutritional needs to be met and growth to take place baby also needs to be supplemented with a bottle and mom has to increase her post pumping . This was discussed with mom , see LC plan below .  Baby also has a red excoriated bottom , with some break down , mom is using aqua - fore diaper rash cream , LC suggested destin with vaseline on top would be more protective.   Lactation Plan of care :  [place] [facility] [place] [facility] [place]  Mom - plenty of fluids, especially water , nutritious snacks, meals , rest , naps, Feedings - by 3 hours if ONorthwest [employer], wake up and feed. Also with feeding cues. Steps for latching -   Option #1 feed at the breast 15 - 20 mins and supplement after 60 ml , increase as needed  Option #2 - ODanne Baxterreceives a bottle for feeding , pump both breast for 15 -234ms , save milk  Important - Breast need to be stimulated consistently at least 8 x's a day /24752 29 1/43 54eeks old 60 -90 ml ( 2-3 oz ) per feeding

## 2014-11-26 ENCOUNTER — Ambulatory Visit (HOSPITAL_COMMUNITY): Payer: PRIVATE HEALTH INSURANCE

## 2014-11-28 ENCOUNTER — Ambulatory Visit (HOSPITAL_COMMUNITY): Payer: PRIVATE HEALTH INSURANCE | Attending: Obstetrics & Gynecology

## 2015-01-02 ENCOUNTER — Ambulatory Visit: Payer: PRIVATE HEALTH INSURANCE | Admitting: Internal Medicine

## 2015-01-20 ENCOUNTER — Ambulatory Visit: Payer: PRIVATE HEALTH INSURANCE | Admitting: Internal Medicine

## 2015-02-15 ENCOUNTER — Other Ambulatory Visit (HOSPITAL_COMMUNITY)
Admission: RE | Admit: 2015-02-15 | Discharge: 2015-02-15 | Disposition: A | Discharge status: Home / Self Care | Payer: No Typology Code available for payment source | Source: Ambulatory Visit | Attending: Interventional Radiology | Admitting: Interventional Radiology

## 2015-02-23 ENCOUNTER — Encounter: Payer: Self-pay | Admitting: Internal Medicine

## 2015-02-23 ENCOUNTER — Ambulatory Visit (INDEPENDENT_AMBULATORY_CARE_PROVIDER_SITE_OTHER): Payer: No Typology Code available for payment source | Admitting: Internal Medicine

## 2015-02-23 ENCOUNTER — Ambulatory Visit
Admission: RE | Admit: 2015-02-23 | Discharge: 2015-02-23 | Disposition: A | Discharge status: Home / Self Care | Payer: No Typology Code available for payment source | Source: Ambulatory Visit | Attending: Internal Medicine | Admitting: Internal Medicine

## 2015-02-23 VITALS — BP 114/66 | HR 55 | Temp 98.3°F | Resp 12 | Ht 67.75 in | Wt 211.0 lb

## 2015-02-23 DIAGNOSIS — E041 Nontoxic single thyroid nodule: Secondary | ICD-10-CM | POA: Diagnosis not present

## 2015-02-23 NOTE — Patient Instructions (Signed)
Please stop downstairs at [facility] to schedule thyroid U/S.  Please return in 1 year.  Thyroid Biopsy The thyroid gland is a butterfly-shaped gland situated in the front of the neck. It produces hormones which affect metabolism, growth and development, and body temperature. A thyroid biopsy is a procedure in which small samples of tissue or fluid are removed from the thyroid gland or mass and examined under a microscope. This test is done to determine the cause of thyroid problems, such as infection, cancer, or other thyroid problems. There are 2 ways to obtain samples: 1. Fine needle biopsy. Samples are removed using a thin needle inserted through the skin and into the thyroid gland or mass. 2. Open biopsy. Samples are removed after a cut (incision) is made through the skin. LET YOUR CAREGIVER KNOW ABOUT:   Allergies.  Medications taken including herbs, eye drops, over-the-counter medications, and creams.  Use of steroids (by mouth or creams).  Previous problems with anesthetics or numbing medicine.  Possibility of pregnancy, if this applies.  History of blood clots (thrombophlebitis).  History of bleeding or blood problems.  Previous surgery.  Other health problems. RISKS AND COMPLICATIONS  Bleeding from the site. The risk of bleeding is higher if you have a bleeding disorder or are taking any blood thinning medications (anticoagulants).  Infection.  Injury to structures near the thyroid gland. BEFORE THE PROCEDURE  This is a procedure that can be done as an outpatient. Confirm the time that you need to arrive for your procedure. Confirm whether there is a need to fast or withhold any medications. A blood sample may be done to determine your blood clotting time. Medicine may be given to help you relax (sedative). PROCEDURE Fine needle biopsy. You will be awake during the procedure. You may be asked to lie on your back with your head tipped backward to extend your neck. Let  your caregiver know if you cannot tolerate the positioning. An area on your neck will be cleansed. A needle is inserted through the skin of your neck. You may feel a mild discomfort during this procedure. You may be asked to avoid coughing, talking, swallowing, or making sounds during some portions of the procedure. The needle is withdrawn once tissue or fluid samples have been removed. Pressure may be applied to the neck to reduce swelling and ensure that bleeding has stopped. The samples will be sent for examination.  Open biopsy. You will be given general anesthesia. You will be asleep during the procedure. An incision is made in your neck. A sample of thyroid tissue or the mass is removed. The tissue sample or mass will be sent for examination. The sample or mass may be examined during the biopsy. If the sample or mass contains cancer cells, some or all of the thyroid gland may be removed. The incision is closed with stitches. AFTER THE PROCEDURE  Your recovery will be assessed and monitored. If there are no problems, as an outpatient, you should be able to go home shortly after the procedure. If you had a fine needle biopsy:  You may have soreness at the biopsy site for 1 to 2 days. If you had an open biopsy:   You may have soreness at the biopsy site for 3 to 4 days.  You may have a hoarse voice or sore throat for 1 to 2 days. Obtaining the Test Results It is your responsibility to obtain your test results. Do not assume everything is normal if you have  not heard from your caregiver or the medical facility. It is important for you to follow up on all of your test results. HOME CARE INSTRUCTIONS   Keeping your head raised on a pillow when you are lying down may ease biopsy site discomfort.  Supporting the back of your head and neck with both hands as you sit up from a lying position may ease biopsy site discomfort.  Only take over-the-counter or prescription medicines for pain, discomfort,  or fever as directed by your caregiver.  Throat lozenges or gargling with warm salt water may help to soothe a sore throat. SEEK IMMEDIATE MEDICAL CARE IF:   You have severe bleeding from the biopsy site.  You have difficulty swallowing.  You have a fever.  You have increased pain, swelling, redness, or warmth at the biopsy site.  You notice pus coming from the biopsy site.  You have swollen glands (lymph nodes) in your neck. Document Released: 07/24/2007 Document Revised: 01/21/2013 Document Reviewed: 12/19/2013 [facility] Patient Information 2015 [place], [place]. This information is not intended to replace advice given to you by your health care provider. Make sure you discuss any questions you have with your health care provider.

## 2015-02-23 NOTE — Progress Notes (Addendum)
Patient ID: [name], female   DOB: [birth year], 44 y.o.   MRN: [id number]   HPI  Pamela [name] is a 44 y.o.-year-old female, referred by her PCP, Dr. Dellis [name], for evaluation and management of a thyroid mass.  She had an exam with ObGyn on 12/15/2014 >> Dr. Dellis [name] felt a lump in neck.   I reviewed pt's thyroid tests: 12/15/2014: TSH 1.23   Pt describes: - + weight gain and loss - + fatigue (has a 46 month old child) - no cold intolerance - no depression - no constipation - no dry skin - no hair loss  Pt denies feeling nodules in neck, hoarseness, dysphagia/odynophagia, SOB with lying down, + choking when drinking liquids.  ? FH of thyroid cancer. She is adopted.  No h/o radiation tx to head or neck. No recent use of iodine supplements.  I reviewed her chart and she also has a history of HTN during pregnancy >> son born 10/2014.   ROS: Constitutional: + weight gain/+ loss,+ fatigue, no subjective hyperthermia/hypothermia Eyes: no blurry vision, no xerophthalmia ENT: no sore throat, no nodules palpated in throat, no dysphagia/odynophagia, no hoarseness, + tinnitus Cardiovascular: no CP/SOB/palpitations/leg swelling Respiratory: no cough/SOB Gastrointestinal: no N/V/D/C Musculoskeletal: no muscle/joint aches Skin: no rashes Neurological: no tremors/numbness/tingling/dizziness, + HA Psychiatric: no depression/anxiety  Past Medical History  Diagnosis Date  . Genital warts     removed  . Recurrent UTI (urinary tract infection)   . Bleeding in early pregnancy 03/29/2014  . Termination of pregnancy (fetus)     x 1  . Newborn product of in vitro fertilization (IVF) pregnancy   . Dysrhythmia     resolved- "grew out of it" - no problem since teenager  . Hypertension 2016    [place] with 2016 pregnancy  . GERD (gastroesophageal reflux disease)     with 2016 pregnancy   Past Surgical History  Procedure Laterality Date  . Bladder surgery  1992    Widened the urethra    . Tubal ligation    . Myomectomy  2008  . Myomectomy  2014    Removed tubes bilaterally  . Wisdom tooth extraction    . Cesarean section N/A 11/04/2014    Procedure: CESAREAN SECTION;  Surgeon: [name], MD;  Location: [facility];  Service: Obstetrics;  Laterality: N/A;  EDD: 11/23/14  . Myomectomy N/A 11/04/2014    Procedure: MYOMECTOMY;  Surgeon: [name], MD;  Location: [facility];  Service: Obstetrics;  Laterality: N/A;   History   Social History  . Marital Status: Married    Spouse Name: N/A  . Number of Children: 1   Occupational History  .  accountant    Social History Main Topics  . Smoking status: Former Smoker -- 1.00 packs/day for 20 years    Types: Cigarettes    Quit date: 08/11/2007  . Smokeless tobacco: Never Used  . Alcohol Use: Yes     Comment: Beer/liquor 1 drink once a week   . Drug Use: Yes    Special: Marijuana, Cocaine     Comment: 25 years ago "Acid"  , last use cocaine/marijuana 25 yrs ago   Current Outpatient Prescriptions on File Prior to Visit  Medication Sig Dispense Refill  . ibuprofen (ADVIL,MOTRIN) 600 MG tablet Take 1 tablet (600 mg total) by mouth every 6 (six) hours. 30 tablet 0  . hydrALAZINE (APRESOLINE) 10 MG tablet Take 0.5 tablets (5 mg total) by mouth daily. (Patient not taking: Reported on 02/23/2015) 30  tablet 0  . hydrochlorothiazide (MICROZIDE) 12.5 MG capsule Take 1 capsule (12.5 mg total) by mouth daily. (Patient not taking: Reported on 02/23/2015) 5 capsule 0  . oxyCODONE-acetaminophen (PERCOCET/ROXICET) 5-325 MG per tablet Take 1-2 tablets by mouth every 4 (four) hours as needed (for pain scale less than 7). (Patient not taking: Reported on 02/23/2015) 30 tablet 0  . Prenatal Vit-Fe Fumarate-FA (PRENATAL MULTIVITAMIN) TABS tablet Take 1 tablet by mouth daily at 12 noon.     No current facility-administered medications on file prior to visit.   Allergies  Allergen Reactions  . Augmentin [Amoxicillin-Pot Clavulanate] Nausea  And Vomiting  . Erythromycin Nausea And Vomiting   No family history available. Patient is adopted.  PE: BP 114/66 mmHg  Pulse 55  Temp(Src) 98.3 F (36.8 C) (Oral)  Resp 12  Ht 5' 7.75" (1.721 m)  Wt 211 lb (95.709 kg)  BMI 32.31 kg/m2  SpO2 97% Wt Readings from Last 3 Encounters:  02/23/15 211 lb (95.709 kg)  11/04/14 225 lb (102.059 kg)  11/03/14 218 lb (98.884 kg)   Constitutional: overweight, in NAD Eyes: PERRLA, EOMI, no exophthalmos ENT: moist mucous membranes, no thyromegaly, easily palpable thyroid nodule in the R lobe or connection between isthmus and R lobe of the thyroid. This appears to be approximately 1.5 cm in diameter. It moves freely with swallowing; has upper left-sided cervical lymphadenopathy Cardiovascular: RRR, No MRG Respiratory: CTA B Gastrointestinal: abdomen soft, NT, ND, BS+ Musculoskeletal: no deformities, strength intact in all 4 Skin: moist, warm, no rashes Neurological: no tremor with outstretched hands, DTR normal in all 4  ASSESSMENT: 1. Thyroid nodule  PLAN: 1. Thyroid nodule - pt has an easily palpable thyroid nodule in the R lobe or connection between isthmus and R lobe of the thyroid. This appears to be approximately 1.5 cm in diameter. It moves freely with swallowing. - She was adopted, therefore, family history of thyroid disease is not known. She did not have radiation therapy to her head or her neck to increase her chances of thyroid cancer. - I suggested to obtain a thyroid ultrasound to better characterize the nodule. After this, I explained that we may need a thyroid biopsy. I explained what the test entails. She agrees for this, if needed. - I explained that this is not cancer, we can continue to follow her on a yearly basis, and check another ultrasound in another year or 2. If this is cancerous, I explained that I will need to refer her to surgery, and she will need to be on levothyroxine afterwards. She is worried about this since  she would like to get pregnant again (still has embryos left from the previous IVF) but it depends on her husband's decision. I explained that being on levothyroxine will not preclude a possible pregnancy, especially if the thyroid tests are under control.  - she should let me know if she develops neck compression symptoms - I'll see her back in a year, assuming her ultrasound/FNA is normal. If FNA abnormal, we will meet sooner.  - No need for repeat TSH, since she had a recent one that was normal -we reviewed this together   CLINICAL DATA: Palpable thyroid nodule to the right of midline.  EXAM: THYROID ULTRASOUND  TECHNIQUE: Ultrasound examination of the thyroid gland and adjacent soft tissues was performed.  COMPARISON: None.  FINDINGS: Right thyroid lobe  Measurements: 5.3 x 1.4 x 1.9 cm. Small solid area of nodularity in the superior right lobe measures approximately 0.7  x 0.6 x 0.5 cm. This small nodule may contain some subtle areas of calcification internally.  Left thyroid lobe  Measurements: 4.6 x 1.1 x 1.7 cm. Tiny benign cyst in the mid left lobe measures less than 2 mm in diameter.  Isthmus  Thickness: 0.2 cm in the midline. Hypoechoic, solid right-sided isthmus nodule measures approximately 1.4 x 0.6 x 1.0 cm. There are a few scattered areas of peripheral calcification in this nodule.  Lymphadenopathy  None visualized.  IMPRESSION: The palpable abnormality corresponds to a solid right-sided isthmus nodule measuring approximately 1.4 x 0.6 x 1.0 cm. Given suggestion of some areas of internal microcalcification by ultrasound, biopsy of this nodule should be considered. A small subcentimeter right thyroid nodule does not meet size criteria for biopsy.  Findings meet consensus criteria for biopsy. Ultrasound-guided fine needle aspiration of the right isthmus nodule should be considered, as per the consensus statement: Management of Thyroid Nodules Detected  at [place]: Society of Radiologists in [place]. Radiology 2005; [id number].   Electronically Signed By: Aletta Edouard M.D. On: 02/23/2015 13:20    I will discuss with the patient and suggest to proceed with biopsy of the nodule. I will addend the results when they become available.  Adequacy Reason Satisfactory For Evaluation. Diagnosis THYROID, FINE NEEDLE ASPIRATION, ISTHMUS (SPECIMEN 1 OF 1, COLLECTED ON 02/25/15): FINDINGS CONSISTENT WITH PAPILLARY CARCINOMA (BETHESDA CATEGORY VI). SEE COMMENT. COMMENT: DR. Herbie Baltimore HILLARD HAS REVIEWED THE CASE AND CONCURS WITH THIS INTERPRETATION. DR. Cruzita Lederer WAS PAGED ON 02/26/2015. Enid Cutter MD Pathologist, Electronic Signature (Case signed 02/26/2015)  I called and discussed the cytology results with the patient. She agrees with surgery referral. I will see her approximately a month after the surgery.

## 2015-02-25 ENCOUNTER — Ambulatory Visit
Admission: RE | Admit: 2015-02-25 | Discharge: 2015-02-25 | Disposition: A | Discharge status: Home / Self Care | Payer: No Typology Code available for payment source | Source: Ambulatory Visit | Attending: Internal Medicine | Admitting: Internal Medicine

## 2015-02-25 DIAGNOSIS — E041 Nontoxic single thyroid nodule: Secondary | ICD-10-CM | POA: Diagnosis present

## 2015-02-27 ENCOUNTER — Telehealth: Payer: Self-pay | Admitting: Internal Medicine

## 2015-02-27 NOTE — Addendum Note (Signed)
Addended by: Philemon Kingdom on: 02/27/2015 08:49 AM   Modules accepted: Orders, Level of Service

## 2015-02-27 NOTE — Telephone Encounter (Signed)
Faxed office note and pathology report to Garth Schlatter at Dr Gala Lewandowsky office [phone].

## 2015-02-27 NOTE — Telephone Encounter (Signed)
Sunday Spillers  From Dr Harlow Asa office, need notes and labs on patient prior to referral appt, fax [phone] attn to Garth Schlatter

## 2015-03-02 ENCOUNTER — Ambulatory Visit: Payer: Self-pay | Admitting: Surgery

## 2015-03-06 ENCOUNTER — Encounter (HOSPITAL_COMMUNITY): Payer: Self-pay

## 2015-03-10 ENCOUNTER — Other Ambulatory Visit: Payer: No Typology Code available for payment source

## 2015-03-10 NOTE — Patient Instructions (Addendum)
[facility]  03/10/2015   Your procedure is scheduled on:    03/12/2015    Report to [facility] Main  Entrance and follow signs to               [place] at     1030 AM.  Call this number if you have problems the morning of surgery [phone]   Remember: ONLY 1 PERSON MAY GO WITH YOU TO SHORT STAY TO GET  READY MORNING OF [place].  Do not eat food or drink liquids :After Midnight.     Take these medicines the morning of surgery with A SIP OF WATER:  none                                You may not have any metal on your body including hair pins and              piercings  Do not wear jewelry, make-up, lotions, powders or perfumes, deodorant             Do not wear nail polish.  Do not shave  48 hours prior to surgery.             Do not bring valuables to the hospital. [place].  Contacts, dentures or bridgework may not be worn into surgery.  Leave suitcase in the car. After surgery it may be brought to your room.         Special Instructions: coughing and deep breathing exercises, leg exercises               Please read over the following fact sheets you were given: _____________________________________________________________________             [facility] - Preparing for Surgery Before surgery, you can play an important role.  Because skin is not sterile, your skin needs to be as free of germs as possible.  You can reduce the number of germs on your skin by washing with CHG (chlorahexidine gluconate) soap before surgery.  CHG is an antiseptic cleaner which kills germs and bonds with the skin to continue killing germs even after washing. Please DO NOT use if you have an allergy to CHG or antibacterial soaps.  If your skin becomes reddened/irritated stop using the CHG and inform your nurse when you arrive at Short Stay. Do not shave (including legs and underarms) for at least 48 hours  prior to the first CHG shower.  You may shave your face/neck. Please follow these instructions carefully:  1.  Shower with CHG Soap the night before surgery and the  morning of Surgery.  2.  If you choose to wash your hair, wash your hair first as usual with your  normal  shampoo.  3.  After you shampoo, rinse your hair and body thoroughly to remove the  shampoo.                           4.  Use CHG as you would any other liquid soap.  You can apply chg directly  to the skin and wash  Gently with a scrungie or clean washcloth.  5.  Apply the CHG Soap to your body ONLY FROM THE NECK DOWN.   Do not use on face/ open                           Wound or open sores. Avoid contact with eyes, ears mouth and genitals (private parts).                       Wash face,  Genitals (private parts) with your normal soap.             6.  Wash thoroughly, paying special attention to the area where your surgery  will be performed.  7.  Thoroughly rinse your body with warm water from the neck down.  8.  DO NOT shower/wash with your normal soap after using and rinsing off  the CHG Soap.                9.  Pat yourself dry with a clean towel.            10.  Wear clean pajamas.            11.  Place clean sheets on your bed the night of your first shower and do not  sleep with pets. Day of Surgery : Do not apply any lotions/deodorants the morning of surgery.  Please wear clean clothes to the hospital/surgery center.  FAILURE TO FOLLOW THESE INSTRUCTIONS MAY RESULT IN THE CANCELLATION OF YOUR SURGERY PATIENT SIGNATURE_________________________________  NURSE SIGNATURE__________________________________  ________________________________________________________________________

## 2015-03-11 ENCOUNTER — Encounter (HOSPITAL_COMMUNITY): Payer: Self-pay

## 2015-03-11 ENCOUNTER — Encounter (HOSPITAL_COMMUNITY)
Admission: RE | Admit: 2015-03-11 | Discharge: 2015-03-11 | Disposition: A | Discharge status: Home / Self Care | Payer: No Typology Code available for payment source | Source: Ambulatory Visit | Attending: Surgery | Admitting: Surgery

## 2015-03-11 ENCOUNTER — Ambulatory Visit (HOSPITAL_COMMUNITY)
Admission: RE | Admit: 2015-03-11 | Discharge: 2015-03-11 | Disposition: A | Discharge status: Home / Self Care | Payer: No Typology Code available for payment source | Source: Ambulatory Visit | Attending: Anesthesiology | Admitting: Anesthesiology

## 2015-03-11 DIAGNOSIS — Z01818 Encounter for other preprocedural examination: Secondary | ICD-10-CM | POA: Insufficient documentation

## 2015-03-11 DIAGNOSIS — C73 Malignant neoplasm of thyroid gland: Secondary | ICD-10-CM | POA: Insufficient documentation

## 2015-03-11 HISTORY — DX: Dermatitis, unspecified: L30.9

## 2015-03-11 HISTORY — DX: Malignant neoplasm of thyroid gland: C73

## 2015-03-11 LAB — BASIC METABOLIC PANEL
Anion gap: 7 (ref 5–15)
BUN: 11 mg/dL (ref 6–20)
CO2: 26 mmol/L (ref 22–32)
Calcium: 9.2 mg/dL (ref 8.9–10.3)
Chloride: 106 mmol/L (ref 101–111)
Creatinine, Ser: 0.81 mg/dL (ref 0.44–1.00)
GFR calc Af Amer: >60 mL/min (ref >60)
GFR calc non Af Amer: >60 mL/min (ref >60)
Glucose, Bld: 111 mg/dL — ABNORMAL HIGH (ref 65–99)
Potassium: 4.4 mmol/L (ref 3.5–5.1)
Sodium: 139 mmol/L (ref 135–145)

## 2015-03-11 LAB — HCG, SERUM, QUALITATIVE: Preg, Serum: NEGATIVE

## 2015-03-11 LAB — CBC
HCT: 40.6 % (ref 36.0–46.0)
Hemoglobin: 13.4 g/dL (ref 12.0–15.0)
MCH: 28.8 pg (ref 26.0–34.0)
MCHC: 33 g/dL (ref 30.0–36.0)
MCV: 87.1 fL (ref 78.0–100.0)
Platelets: 265 10*3/uL (ref 150–400)
RBC: 4.66 MIL/uL (ref 3.87–5.11)
RDW: 12.8 % (ref 11.5–15.5)
WBC: 8.7 10*3/uL (ref 4.0–10.5)

## 2015-03-12 ENCOUNTER — Ambulatory Visit (HOSPITAL_COMMUNITY): Payer: No Typology Code available for payment source | Admitting: Certified Registered Nurse Anesthetist

## 2015-03-12 ENCOUNTER — Observation Stay (HOSPITAL_COMMUNITY)
Admission: RE | Admit: 2015-03-12 | Discharge: 2015-03-13 | Disposition: A | Discharge status: Home / Self Care | Payer: No Typology Code available for payment source | Source: Ambulatory Visit | Attending: Surgery | Admitting: Surgery

## 2015-03-12 ENCOUNTER — Encounter (HOSPITAL_COMMUNITY)
Admission: RE | Disposition: A | Discharge status: Home / Self Care | Payer: Self-pay | Source: Ambulatory Visit | Attending: Surgery

## 2015-03-12 ENCOUNTER — Encounter (HOSPITAL_COMMUNITY): Payer: Self-pay | Admitting: Surgery

## 2015-03-12 DIAGNOSIS — C73 Malignant neoplasm of thyroid gland: Principal | ICD-10-CM | POA: Diagnosis present

## 2015-03-12 DIAGNOSIS — Z881 Allergy status to other antibiotic agents status: Secondary | ICD-10-CM | POA: Diagnosis not present

## 2015-03-12 DIAGNOSIS — Z888 Allergy status to other drugs, medicaments and biological substances status: Secondary | ICD-10-CM | POA: Diagnosis not present

## 2015-03-12 DIAGNOSIS — I10 Essential (primary) hypertension: Secondary | ICD-10-CM | POA: Insufficient documentation

## 2015-03-12 DIAGNOSIS — Z87891 Personal history of nicotine dependence: Secondary | ICD-10-CM | POA: Insufficient documentation

## 2015-03-12 HISTORY — PX: LYMPH NODE DISSECTION: SHX5087

## 2015-03-12 HISTORY — PX: THYROIDECTOMY: SHX17

## 2015-03-12 SURGERY — THYROIDECTOMY
Anesthesia: General | Site: Neck

## 2015-03-12 MED ORDER — PROMETHAZINE HCL 25 MG/ML IJ SOLN: 6.2500 mg | INTRAMUSCULAR | Status: DC | PRN

## 2015-03-12 MED ORDER — MEPERIDINE HCL 50 MG/ML IJ SOLN: 6.2500 mg | INTRAMUSCULAR | Status: DC | PRN

## 2015-03-12 MED ORDER — MIDAZOLAM HCL 2 MG/2ML IJ SOLN
INTRAMUSCULAR | Status: AC
  Filled 2015-03-12: qty 2

## 2015-03-12 MED ORDER — HYDROCODONE-ACETAMINOPHEN 5-325 MG PO TABS
1.0000 | ORAL_TABLET | ORAL | Status: DC | PRN
  Administered 2015-03-12: 1 via ORAL
  Filled 2015-03-12 (×2): qty 1

## 2015-03-12 MED ORDER — LIDOCAINE HCL (CARDIAC) 20 MG/ML IV SOLN
INTRAVENOUS | Status: DC | PRN
  Administered 2015-03-12: 50 mg via INTRAVENOUS

## 2015-03-12 MED ORDER — CIPROFLOXACIN IN D5W 400 MG/200ML IV SOLN
400.0000 mg | INTRAVENOUS | Status: AC
  Administered 2015-03-12: 400 mg via INTRAVENOUS

## 2015-03-12 MED ORDER — LACTATED RINGERS IV SOLN
INTRAVENOUS | Status: DC
  Administered 2015-03-12 (×3): via INTRAVENOUS
  Administered 2015-03-12: 1000 mL via INTRAVENOUS

## 2015-03-12 MED ORDER — NEOSTIGMINE METHYLSULFATE 10 MG/10ML IV SOLN
INTRAVENOUS | Status: AC
  Filled 2015-03-12: qty 1

## 2015-03-12 MED ORDER — DEXAMETHASONE SODIUM PHOSPHATE 10 MG/ML IJ SOLN
INTRAMUSCULAR | Status: AC
  Filled 2015-03-12: qty 1

## 2015-03-12 MED ORDER — PROPOFOL 10 MG/ML IV BOLUS
INTRAVENOUS | Status: AC
  Filled 2015-03-12: qty 20

## 2015-03-12 MED ORDER — FENTANYL CITRATE (PF) 250 MCG/5ML IJ SOLN
INTRAMUSCULAR | Status: AC
  Filled 2015-03-12: qty 5

## 2015-03-12 MED ORDER — FENTANYL CITRATE (PF) 100 MCG/2ML IJ SOLN
INTRAMUSCULAR | Status: AC
  Filled 2015-03-12: qty 2

## 2015-03-12 MED ORDER — CIPROFLOXACIN IN D5W 400 MG/200ML IV SOLN
INTRAVENOUS | Status: AC
  Filled 2015-03-12: qty 200

## 2015-03-12 MED ORDER — KCL IN DEXTROSE-NACL 20-5-0.45 MEQ/L-%-% IV SOLN
INTRAVENOUS | Status: DC
  Administered 2015-03-12 – 2015-03-13 (×2): via INTRAVENOUS
  Filled 2015-03-12 (×3): qty 1000

## 2015-03-12 MED ORDER — ACETAMINOPHEN 325 MG PO TABS
650.0000 mg | ORAL_TABLET | ORAL | Status: DC | PRN
  Administered 2015-03-13: 650 mg via ORAL
  Filled 2015-03-12: qty 2

## 2015-03-12 MED ORDER — FENTANYL CITRATE (PF) 100 MCG/2ML IJ SOLN
INTRAMUSCULAR | Status: DC | PRN
  Administered 2015-03-12: 25 ug via INTRAVENOUS
  Administered 2015-03-12: 100 ug via INTRAVENOUS
  Administered 2015-03-12 (×2): 50 ug via INTRAVENOUS
  Administered 2015-03-12: 25 ug via INTRAVENOUS
  Administered 2015-03-12 (×2): 50 ug via INTRAVENOUS

## 2015-03-12 MED ORDER — DEXAMETHASONE SODIUM PHOSPHATE 4 MG/ML IJ SOLN
INTRAMUSCULAR | Status: DC | PRN
  Administered 2015-03-12: 10 mg via INTRAVENOUS

## 2015-03-12 MED ORDER — METOCLOPRAMIDE HCL 5 MG/ML IJ SOLN
INTRAMUSCULAR | Status: DC | PRN
  Administered 2015-03-12: 10 mg via INTRAVENOUS

## 2015-03-12 MED ORDER — SUCCINYLCHOLINE CHLORIDE 20 MG/ML IJ SOLN
INTRAMUSCULAR | Status: DC | PRN
  Administered 2015-03-12: 100 mg via INTRAVENOUS

## 2015-03-12 MED ORDER — LACTATED RINGERS IV SOLN: INTRAVENOUS | Status: DC

## 2015-03-12 MED ORDER — ONDANSETRON HCL 4 MG PO TABS: 4.0000 mg | ORAL_TABLET | Freq: Four times a day (QID) | ORAL | Status: DC | PRN

## 2015-03-12 MED ORDER — LIDOCAINE HCL (CARDIAC) 20 MG/ML IV SOLN
INTRAVENOUS | Status: AC
  Filled 2015-03-12: qty 5

## 2015-03-12 MED ORDER — PROPOFOL 10 MG/ML IV BOLUS
INTRAVENOUS | Status: DC | PRN
  Administered 2015-03-12: 200 mg via INTRAVENOUS

## 2015-03-12 MED ORDER — PHENYLEPHRINE 40 MCG/ML (10ML) SYRINGE FOR IV PUSH (FOR BLOOD PRESSURE SUPPORT)
PREFILLED_SYRINGE | INTRAVENOUS | Status: AC
  Filled 2015-03-12: qty 10

## 2015-03-12 MED ORDER — 0.9 % SODIUM CHLORIDE (POUR BTL) OPTIME
TOPICAL | Status: DC | PRN
  Administered 2015-03-12: 1000 mL

## 2015-03-12 MED ORDER — GLYCOPYRROLATE 0.2 MG/ML IJ SOLN
INTRAMUSCULAR | Status: DC | PRN
  Administered 2015-03-12: 0.4 mg via INTRAVENOUS

## 2015-03-12 MED ORDER — ONDANSETRON HCL 4 MG/2ML IJ SOLN
INTRAMUSCULAR | Status: AC
  Filled 2015-03-12: qty 2

## 2015-03-12 MED ORDER — ONDANSETRON HCL 4 MG/2ML IJ SOLN: 4.0000 mg | Freq: Four times a day (QID) | INTRAMUSCULAR | Status: DC | PRN

## 2015-03-12 MED ORDER — GLYCOPYRROLATE 0.2 MG/ML IJ SOLN
INTRAMUSCULAR | Status: AC
  Filled 2015-03-12: qty 2

## 2015-03-12 MED ORDER — NEOSTIGMINE METHYLSULFATE 10 MG/10ML IV SOLN
INTRAVENOUS | Status: DC | PRN
  Administered 2015-03-12: 3 mg via INTRAVENOUS

## 2015-03-12 MED ORDER — MIDAZOLAM HCL 5 MG/5ML IJ SOLN
INTRAMUSCULAR | Status: DC | PRN
  Administered 2015-03-12 (×2): 1 mg via INTRAVENOUS

## 2015-03-12 MED ORDER — CISATRACURIUM BESYLATE 20 MG/10ML IV SOLN
INTRAVENOUS | Status: AC
Start: 2015-03-12 — End: 2015-03-12
  Filled 2015-03-12: qty 10

## 2015-03-12 MED ORDER — METOCLOPRAMIDE HCL 5 MG/ML IJ SOLN
INTRAMUSCULAR | Status: AC
  Filled 2015-03-12: qty 2

## 2015-03-12 MED ORDER — HYDROMORPHONE HCL 1 MG/ML IJ SOLN
1.0000 mg | INTRAMUSCULAR | Status: DC | PRN
  Administered 2015-03-12 – 2015-03-13 (×3): 1 mg via INTRAVENOUS
  Filled 2015-03-12 (×3): qty 1

## 2015-03-12 MED ORDER — FENTANYL CITRATE (PF) 100 MCG/2ML IJ SOLN
25.0000 ug | INTRAMUSCULAR | Status: DC | PRN
  Administered 2015-03-12 (×2): 50 ug via INTRAVENOUS

## 2015-03-12 MED ORDER — ONDANSETRON HCL 4 MG/2ML IJ SOLN
INTRAMUSCULAR | Status: DC | PRN
  Administered 2015-03-12: 4 mg via INTRAVENOUS

## 2015-03-12 MED ORDER — CISATRACURIUM BESYLATE (PF) 10 MG/5ML IV SOLN
INTRAVENOUS | Status: DC | PRN
  Administered 2015-03-12: 1 mg via INTRAVENOUS
  Administered 2015-03-12: 2 mg via INTRAVENOUS
  Administered 2015-03-12: 6 mg via INTRAVENOUS

## 2015-03-12 MED ORDER — CALCIUM CARBONATE 1250 (500 CA) MG PO TABS
2.0000 | ORAL_TABLET | Freq: Three times a day (TID) | ORAL | Status: DC
  Administered 2015-03-12 – 2015-03-13 (×2): 1000 mg via ORAL
  Filled 2015-03-12 (×5): qty 2

## 2015-03-12 MED ORDER — PHENYLEPHRINE HCL 10 MG/ML IJ SOLN
INTRAMUSCULAR | Status: DC | PRN
  Administered 2015-03-12 (×5): 40 ug via INTRAVENOUS

## 2015-03-12 SURGICAL SUPPLY — 35 items
ATTRACTOMAT 16X20 MAGNETIC DRP (DRAPES) ×3 IMPLANT
BENZOIN TINCTURE PRP APPL 2/3 (GAUZE/BANDAGES/DRESSINGS) ×3 IMPLANT
BLADE HEX COATED 2.75 (ELECTRODE) ×3 IMPLANT
BLADE SURG 15 STRL LF DISP TIS (BLADE) ×2 IMPLANT
BLADE SURG 15 STRL SS (BLADE) ×1
CHLORAPREP W/TINT 26ML (MISCELLANEOUS) ×3 IMPLANT
CLIP TI MEDIUM 6 (CLIP) ×6 IMPLANT
CLIP TI WIDE RED SMALL 6 (CLIP) ×6 IMPLANT
DISSECTOR ROUND CHERRY 3/8 STR (MISCELLANEOUS) IMPLANT
DRAPE LAPAROTOMY T 98X78 PEDS (DRAPES) ×3 IMPLANT
DRESSING SURGICEL FIBRLLR 1X2 (HEMOSTASIS) ×2 IMPLANT
DRSG SURGICEL FIBRILLAR 1X2 (HEMOSTASIS) ×3
ELECT REM PT RETURN 9FT ADLT (ELECTROSURGICAL) ×3
ELECTRODE REM PT RTRN 9FT ADLT (ELECTROSURGICAL) ×2 IMPLANT
GAUZE SPONGE 4X4 12PLY STRL (GAUZE/BANDAGES/DRESSINGS) IMPLANT
GAUZE SPONGE 4X4 16PLY XRAY LF (GAUZE/BANDAGES/DRESSINGS) ×3 IMPLANT
GLOVE SURG ORTHO 8.0 STRL STRW (GLOVE) ×3 IMPLANT
GOWN STRL REUS W/TWL XL LVL3 (GOWN DISPOSABLE) ×15 IMPLANT
KIT BASIN OR (CUSTOM PROCEDURE TRAY) ×3 IMPLANT
LIQUID BAND (GAUZE/BANDAGES/DRESSINGS) IMPLANT
PACK BASIC VI WITH GOWN DISP (CUSTOM PROCEDURE TRAY) ×3 IMPLANT
PENCIL BUTTON HOLSTER BLD 10FT (ELECTRODE) ×3 IMPLANT
SHEARS HARMONIC 9CM CVD (BLADE) ×3 IMPLANT
STAPLER VISISTAT 35W (STAPLE) IMPLANT
STRIP CLOSURE SKIN 1/2X4 (GAUZE/BANDAGES/DRESSINGS) ×3 IMPLANT
SUT MNCRL AB 4-0 PS2 18 (SUTURE) ×3 IMPLANT
SUT SILK 2 0 (SUTURE)
SUT SILK 2-0 18XBRD TIE 12 (SUTURE) IMPLANT
SUT SILK 3 0 (SUTURE)
SUT SILK 3-0 18XBRD TIE 12 (SUTURE) IMPLANT
SUT VIC AB 3-0 SH 18 (SUTURE) ×6 IMPLANT
SYR BULB IRRIGATION 50ML (SYRINGE) ×3 IMPLANT
TOWEL OR 17X26 10 PK STRL BLUE (TOWEL DISPOSABLE) ×3 IMPLANT
TOWEL OR NON WOVEN STRL DISP B (DISPOSABLE) ×3 IMPLANT
YANKAUER SUCT BULB TIP 10FT TU (MISCELLANEOUS) ×3 IMPLANT

## 2015-03-12 NOTE — Anesthesia Procedure Notes (Signed)
Procedure Name: Intubation Date/Time: 03/12/2015 12:27 PM Performed by: Deliah Boston Pre-anesthesia Checklist: Patient identified, Emergency Drugs available, Suction available and Patient being monitored Patient Re-evaluated:Patient Re-evaluated prior to inductionOxygen Delivery Method: Circle System Utilized Preoxygenation: Pre-oxygenation with 100% oxygen Intubation Type: IV induction Ventilation: Mask ventilation without difficulty Laryngoscope Size: Mac and 3 Grade View: Grade I Tube type: Oral Tube size: 7.0 mm Number of attempts: 1 Airway Equipment and Method: Oral airway Placement Confirmation: ETT inserted through vocal cords under direct vision,  positive ETCO2 and breath sounds checked- equal and bilateral Secured at: 21 cm Tube secured with: Tape Dental Injury: Teeth and Oropharynx as per pre-operative assessment

## 2015-03-12 NOTE — Transfer of Care (Signed)
Immediate Anesthesia Transfer of Care Note  Patient: Pamela [name]  Procedure(s) Performed: Procedure(s): TOTAL THYROIDECTOMY (N/A) LYMPH NODE DISSECTION  Patient Location: PACU  Anesthesia Type:General  Level of Consciousness: Patient easily awoken, sedated, comfortable, cooperative, following commands, responds to stimulation.   Airway & Oxygen Therapy: Patient spontaneously breathing, ventilating well, oxygen via simple oxygen mask.  Post-op Assessment: Report given to PACU RN, vital signs reviewed and stable, moving all extremities.   Post vital signs: Reviewed and stable.  Complications: No apparent anesthesia complications

## 2015-03-12 NOTE — H&P (Signed)
General Surgery [facility]  Sissi Padia 03/02/2015 10:19 AM Location: [facility] Patient #: [id number] DOB: [birth year] Married / Language: Undefined / Race: Undefined Female  History of Present Illness Earnstine Regal MD; 03/02/2015 11:09 AM)  The patient is a 44 year old female who presents with thyroid cancer. Patient is referred by Dr. Philemon Kingdom for newly diagnosed papillary thyroid carcinoma. Patient was initially noted on physical examination by her gynecologist in March 2016 to have a thyroid nodule. Patient was referred to endocrinology and underwent thyroid ultrasound. This showed a 7 mm nodule in the upper right thyroid lobe, 2 mm nodules in the left thyroid lobe, and a 1.4 cm nodule in the thyroid isthmus with calcifications. Patient subsequently underwent fine-needle aspiration biopsy which shows evidence of papillary thyroid carcinoma, Bethesda category VI. Patient has had no prior head or neck surgery. Patient has never been on thyroid medication. She is adopted and knows no family history. She denies tremors or palpitations. She has no compressive symptoms.   Other Problems Marjean Donna, CMA; 03/02/2015 10:19 AM) Bladder Problems High blood pressure Thyroid Cancer  Past Surgical History Marjean Donna, CMA; 03/02/2015 10:19 AM) Cesarean Section - 1 Esophagomyotomy  Diagnostic Studies History Marjean Donna, CMA; 03/02/2015 10:19 AM) Colonoscopy never Mammogram 1-3 years ago Pap Smear 1-5 years ago  Allergies Davy Pique Bynum, CMA; 03/02/2015 10:20 AM) Augmentin ES-600 *PENICILLINS* Erythromycin *DERMATOLOGICALS*  Medication History (Sonya Bynum, CMA; 03/02/2015 10:20 AM) No Current Medications Medications Reconciled  Social History Marjean Donna, CMA; 03/02/2015 10:19 AM) Alcohol use Occasional alcohol use. Caffeine use Carbonated beverages, Coffee, Tea. No drug use Tobacco use Former smoker.  Family History  Marjean Donna, CMA; 03/02/2015 10:19 AM) Family history unknown First Degree Relatives  Pregnancy / Birth History Marjean Donna, [place]; 03/02/2015 10:19 AM) Age at menarche 24 years. Contraceptive History Oral contraceptives. Gravida 2 Maternal age 79-30 Para 1 Regular periods  Review of Systems Davy Pique Bynum CMA; 03/02/2015 10:19 AM) General Not Present- Appetite Loss, Chills, Fatigue, Fever, Night Sweats, Weight Gain and Weight Loss. Skin Not Present- Change in Wart/Mole, Dryness, Hives, Jaundice, New Lesions, Non-Healing Wounds, Rash and Ulcer. HEENT Present- Seasonal Allergies and Wears glasses/contact lenses. Not Present- Earache, Hearing Loss, Hoarseness, Nose Bleed, Oral Ulcers, Ringing in the Ears, Sinus Pain, Sore Throat, Visual Disturbances and Yellow Eyes. Respiratory Not Present- Bloody sputum, Chronic Cough, Difficulty Breathing, Snoring and Wheezing. Breast Not Present- Breast Mass, Breast Pain, Nipple Discharge and Skin Changes. Cardiovascular Not Present- Chest Pain, Difficulty Breathing Lying Down, Leg Cramps, Palpitations, Rapid Heart Rate, Shortness of Breath and Swelling of Extremities. Gastrointestinal Not Present- Abdominal Pain, Bloating, Bloody Stool, Change in Bowel Habits, Chronic diarrhea, Constipation, Difficulty Swallowing, Excessive gas, Gets full quickly at meals, Hemorrhoids, Indigestion, Nausea, Rectal Pain and Vomiting. Female Genitourinary Not Present- Frequency, Nocturia, Painful Urination, Pelvic Pain and Urgency. Musculoskeletal Not Present- Back Pain, Joint Pain, Joint Stiffness, Muscle Pain, Muscle Weakness and Swelling of Extremities. Neurological Not Present- Decreased Memory, Fainting, Headaches, Numbness, Seizures, Tingling, Tremor, Trouble walking and Weakness. Psychiatric Not Present- Anxiety, Bipolar, Change in Sleep Pattern, Depression, Fearful and Frequent crying. Endocrine Not Present- Cold Intolerance, Excessive Hunger, Hair Changes, Heat  Intolerance, Hot flashes and New Diabetes. Hematology Present- Easy Bruising. Not Present- Excessive bleeding, Gland problems, HIV and Persistent Infections.   Vitals (Sonya Bynum CMA; 03/02/2015 10:20 AM) 03/02/2015 10:20 AM Weight: 211.6 lb Height: 67in Body Surface Area: 2.13 m Body Mass Index: 33.14 kg/m Temp.: 57F(Temporal)  Pulse: 79 (Regular)  BP: 132/78 (Sitting, Left Arm, Standard)    Physical Exam Earnstine Regal MD; 03/02/2015 11:10 AM)  General - appears comfortable, no distress; not diaphorectic  HEENT - normocephalic; sclerae clear, gaze conjugate; mucous membranes moist, dentition good; voice normal  Neck - symmetric on extension; no palpable anterior or posterior cervical adenopathy; there is a palpable smooth mobile firm nodule in the midline measuring approximately 2 cm in size; left lobe and right lobe are without palpable abnormality  Chest - clear bilaterally without rhonchi, rales, or wheeze  Cor - regular rhythm with normal rate; no significant murmur  Ext - non-tender without significant edema or lymphedema  Neuro - grossly intact; no tremor    Assessment & Plan  PAPILLARY THYROID CARCINOMA (193  [id number])  Patient presents with papillary thyroid carcinoma confirmed on fine needle aspiration biopsy. I provided she and her husband with written literature on thyroid surgery to review at home.  I have recommended total thyroidectomy for treatment of papillary thyroid carcinoma. We discussed the risk and benefits of the procedure including the potential for recurrent laryngeal nerve injury and injury to parathyroid glands. We discussed the location of the surgical incision. We discussed the hospital stay to be anticipated. We discussed the postoperative recovery and return to work. We discussed the likely need for radioactive iodine treatment. We discussed the need for lifelong thyroid hormone replacement. Patient and her husband understand and wish  to proceed with surgery in the near future.  The risks and benefits of the procedure have been discussed at length with the patient. The patient understands the proposed procedure, potential alternative treatments, and the course of recovery to be expected. All of the patient's questions have been answered at this time. The patient wishes to proceed with surgery.  Earnstine Regal, MD, [facility] Office: [phone]

## 2015-03-12 NOTE — Op Note (Signed)
Procedure Note  Pre-operative Diagnosis:  Papillary thyroid carcinoma  Post-operative Diagnosis:  same  Surgeon:  Earnstine Regal, MD, FACS  Assistant:  none   Procedure:  Total thyroidectomy with limited lymph node dissection  Anesthesia:  General  Estimated Blood Loss:  minimal  Drains: none         Specimen: thyroid to pathology  Indications:  The patient is a 44 year old female who presents with thyroid cancer. Patient is referred by Dr. Philemon Kingdom for newly diagnosed papillary thyroid carcinoma. Patient was initially noted on physical examination by her gynecologist in March 2016 to have a thyroid nodule. Patient was referred to endocrinology and underwent thyroid ultrasound. This showed a 7 mm nodule in the upper right thyroid lobe, 2 mm nodules in the left thyroid lobe, and a 1.4 cm nodule in the thyroid isthmus with calcifications. Patient subsequently underwent fine-needle aspiration biopsy which shows evidence of papillary thyroid carcinoma, Bethesda category VI.   Procedure Details: Procedure was done in OR #1 at the [facility].  The patient was brought to the operating room and placed in a supine position on the operating room table.  Following administration of general anesthesia, the patient was positioned and then prepped and draped in the usual aseptic fashion.  After ascertaining that an adequate level of anesthesia had been achieved, a Kocher incision was made with #15 blade.  Dissection was carried through subcutaneous tissues and platysma. Hemostasis was achieved with the electrocautery.  Skin flaps were elevated cephalad and caudad from the thyroid notch to the sternal notch.  The Mahorner self-retaining retractor was placed for exposure.  Strap muscles were incised in the midline and dissection was begun on the left side.  Strap muscles were reflected laterally.  Left thyroid lobe was firm but relatively normal in size. There was a firm nodule at the  junction of the left lobe and isthmus.  The left lobe was gently mobilized with blunt dissection.  Superior pole vessels were dissected out and divided individually between small and medium Ligaclips with the Harmonic scalpel.  The thyroid lobe was rolled anteriorly.  Branches of the inferior thyroid artery were divided between small Ligaclips with the Harmonic scalpel.  Inferior venous tributaries were divided between Ligaclips.  Both the superior and inferior parathyroid glands were identified and preserved on their vascular pedicles.  The recurrent laryngeal nerve was identified and preserved along its course.  The ligament of Gwenlyn Found was released with the electrocautery and the gland was mobilized onto the anterior trachea. Isthmus was mobilized across the midline.  There was moderate sized pyramidal lobe present.  It was dissected out and resected en bloc with the isthmus.  Dry pack was placed in the left neck.  Next, the right thyroid lobe was gently mobilized with blunt dissection.  Right thyroid lobe was slightly larger and firm.  Superior pole vessels were dissected out and divided between small and medium Ligaclips with the Harmonic scalpel.  Superior parathyroid was identified and preserved.  Inferior venous tributaries were divided between medium Ligaclips with the Harmonic scalpel.  The right thyroid lobe was rolled anteriorly and the branches of the inferior thyroid artery divided between small Ligaclips.  The right recurrent laryngeal nerve was identified and preserved along its course.  The ligament of Gwenlyn Found was released with the electrocautery.  The right thyroid lobe was mobilized onto the anterior trachea and the remainder of the thyroid was dissected off the anterior trachea and the thyroid was completely  excised.  A suture was used to mark the right superior pole. The entire thyroid gland was submitted to pathology for review.  Central compartment lymph nodes were resected using the  electrocautery and ligaclips for hemastasis.  Dissection was carried from the right carotid to the left carotid and down to the innominate vein.  Tissue was submitted to pathology for review.  The neck was irrigated with warm saline.  Fibular was placed throughout the operative field.  Strap muscles were reapproximated in the midline with interrupted 3-0 Vicryl sutures.  Platysma was closed with interrupted 3-0 Vicryl sutures.  Skin was closed with a running 4-0 Monocryl subcuticular suture.  Wound was washed and dried and benzoin and steri-strips were applied.  Dry gauze dressing was placed.  The patient was awakened from anesthesia and brought to the recovery room.  The patient tolerated the procedure well.   Earnstine Regal, MD, [facility] Office: [phone]

## 2015-03-12 NOTE — Progress Notes (Signed)
Quick Note:  These results are acceptable for scheduled surgery.  Vennessa Affinito M. Creighton Longley, MD, FACS [facility] Office: [phone]   ______ 

## 2015-03-12 NOTE — Progress Notes (Signed)
Quick Note:  Pre-operative chest x-ray is acceptable for scheduled surgery.  Pamela [name] M. Kodie Pick, MD, FACS [facility] Office: [phone]   ______ 

## 2015-03-12 NOTE — Anesthesia Preprocedure Evaluation (Addendum)
Anesthesia Evaluation  Patient identified by MRN, date of birth, ID band Patient awake    Reviewed: Allergy & Precautions, NPO status , Patient's Chart, lab work & pertinent test results  Airway Mallampati: II  TM Distance: >3 FB Neck ROM: Full    Dental no notable dental hx.    Pulmonary neg pulmonary ROS, former smoker,  breath sounds clear to auscultation  Pulmonary exam normal       Cardiovascular Normal cardiovascular examRhythm:Regular Rate:Normal     Neuro/Psych negative neurological ROS  negative psych ROS   GI/Hepatic negative GI ROS, Neg liver ROS,   Endo/Other  negative endocrine ROS  Renal/GU negative Renal ROS  negative genitourinary   Musculoskeletal negative musculoskeletal ROS (+)   Abdominal   Peds negative pediatric ROS (+)  Hematology negative hematology ROS (+)   Anesthesia Other Findings   Reproductive/Obstetrics negative OB ROS                           Anesthesia Physical Anesthesia Plan  ASA: II  Anesthesia Plan: General   Post-op Pain Management:    Induction: Intravenous  Airway Management Planned: Oral ETT  Additional Equipment:   Intra-op Plan:   Post-operative Plan: Extubation in OR  Informed Consent: I have reviewed the patients History and Physical, chart, labs and discussed the procedure including the risks, benefits and alternatives for the proposed anesthesia with the patient or authorized representative who has indicated his/her understanding and acceptance.   Dental advisory given  Plan Discussed with: CRNA  Anesthesia Plan Comments:         Anesthesia Quick Evaluation

## 2015-03-13 ENCOUNTER — Encounter (HOSPITAL_COMMUNITY): Payer: Self-pay | Admitting: Surgery

## 2015-03-13 DIAGNOSIS — C73 Malignant neoplasm of thyroid gland: Secondary | ICD-10-CM | POA: Diagnosis not present

## 2015-03-13 LAB — BASIC METABOLIC PANEL
Anion gap: 13 (ref 5–15)
BUN: 7 mg/dL (ref 6–20)
CO2: 22 mmol/L (ref 22–32)
Calcium: 8.6 mg/dL — ABNORMAL LOW (ref 8.9–10.3)
Chloride: 103 mmol/L (ref 101–111)
Creatinine, Ser: 0.67 mg/dL (ref 0.44–1.00)
GFR calc Af Amer: >60 mL/min (ref >60)
GFR calc non Af Amer: >60 mL/min (ref >60)
Glucose, Bld: 151 mg/dL — ABNORMAL HIGH (ref 65–99)
Potassium: 4.1 mmol/L (ref 3.5–5.1)
Sodium: 138 mmol/L (ref 135–145)

## 2015-03-13 MED ORDER — CALCIUM CARBONATE 1250 (500 CA) MG PO TABS: 2.0000 | ORAL_TABLET | Freq: Three times a day (TID) | ORAL | Status: DC

## 2015-03-13 MED ORDER — SYNTHROID 100 MCG PO TABS: 100.0000 ug | ORAL_TABLET | Freq: Every day | ORAL | Status: DC

## 2015-03-13 MED ORDER — OXYCODONE HCL 5 MG PO TABS: 5.0000 mg | ORAL_TABLET | ORAL | Status: DC | PRN

## 2015-03-13 NOTE — Progress Notes (Signed)
Patient alert and oriented. Vital signs stable. Dressing changed. Patient given discharge instructions and all questions and concerns answered. Patient advised to call MD to verify when to return to work. IV removed without complications.

## 2015-03-13 NOTE — Discharge Summary (Signed)
Physician Discharge Summary [facility]  Patient ID: Pamela [name] MRN: [id number] DOB/AGE: [birth year] 46 y.o.  Admit date: 03/12/2015 Discharge date: 03/13/2015  Admission Diagnoses:  Papillary thyroid carcinoma  Discharge Diagnoses:  Principal Problem:   Papillary thyroid carcinoma   Discharged Condition: good  Hospital Course: Patient was admitted for observation following thyroid surgery.  Post op course was uncomplicated.  Pain was well controlled.  Tolerated diet.  Post op calcium level on morning following surgery was 8.6 mg/dl.  Patient was prepared for discharge home on POD#1.  Consults: None  Treatments: surgery: total thyroidectomy with limited node dissection  Discharge Exam: Blood pressure 132/82, pulse 62, temperature 98.1 F (36.7 C), temperature source Oral, resp. rate 16, height 5\' 8"  (1.727 m), weight 95.057 kg (209 lb 9 oz), last menstrual period 02/19/2015, SpO2 96 %, unknown if currently breastfeeding. HEENT - clear Neck - wound dry and intact, minimal STS; voice normal Chest - clear bilaterally Cor - RRR  Disposition: Home  Discharge Instructions    Diet - low sodium heart healthy    Complete by:  As directed      Discharge instructions    Complete by:  As directed   [facility]  THYROID & PARATHYROID SURGERY:  POST-OP INSTRUCTIONS  Always review your discharge instruction sheet from the facility where your surgery was performed.  A prescription for pain medication may be given to you upon discharge.  Take your pain medication as prescribed.  If narcotic pain medicine is not needed, then you may take acetaminophen (Tylenol) or ibuprofen (Advil) as needed.  Take your usually prescribed medications unless otherwise directed.  If you need a refill on your pain medication, please contact your pharmacy. They will contact our office to request authorization.  Prescriptions will not be processed by our office after  5 pm or on weekends.  Start with a light diet upon arrival home, such as soup and crackers or toast.  Be sure to drink plenty of fluids daily.  Resume your normal diet the day after surgery.  Most patients will experience some swelling and bruising on the chest and neck area.  Ice packs will help.  Swelling and bruising can take several days to resolve.   It is common to experience some constipation after surgery.  Increasing fluid intake and taking a stool softener will usually help or prevent this problem.  A mild laxative (Milk of Magnesia or Miralax) should be taken according to package directions if there has been no bowel movement after 48 hours.  You have steri-strips and a gauze dressing over your incision.  You may remove the gauze bandage on the second day after surgery, and you may shower at that time.  Leave your steri-strips (small skin tapes) in place directly over the incision.  These strips should remain on the skin for 5-7 days and then be removed.  You may get them wet in the shower and pat them dry.  You may resume regular (light) daily activities beginning the next day - such as daily self-care, walking, climbing stairs - gradually increasing activities as tolerated.  You may have sexual intercourse when it is comfortable.  Refrain from any heavy lifting or straining until approved by your doctor.  You may drive when you no longer are taking prescription pain medication, you can comfortably wear a seatbelt, and you can safely maneuver your car and apply brakes.  You should see your doctor in the office for  a follow-up appointment approximately two to three weeks after your surgery.  Make sure that you call for this appointment within a day or two after you arrive home to insure a convenient appointment time.  WHEN TO CALL YOUR DOCTOR: -- Fever greater than 101.5 -- Inability to urinate -- Nausea and/or vomiting - persistent -- Extreme swelling or bruising -- Continued bleeding  from incision -- Increased pain, redness, or drainage from the incision -- Difficulty swallowing or breathing -- Muscle cramping or spasms -- Numbness or tingling in hands or around lips  The clinic staff is available to answer your questions during regular business hours.  Please don't hesitate to call and ask to speak to one of the nurses if you have concerns.  Earnstine Regal, MD, [facility] Office: [phone]  Website: [email or web address]     Increase activity slowly    Complete by:  As directed      Remove dressing in 24 hours    Complete by:  As directed             Medication List    TAKE these medications        calcium carbonate 1250 (500 CA) MG tablet  Commonly known as:  OS-CAL - dosed in mg of elemental calcium  Take 2 tablets (1,000 mg of elemental calcium total) by mouth 3 (three) times daily with meals.     hydrALAZINE 10 MG tablet  Commonly known as:  APRESOLINE  Take 0.5 tablets (5 mg total) by mouth daily.     hydrochlorothiazide 12.5 MG capsule  Commonly known as:  MICROZIDE  Take 1 capsule (12.5 mg total) by mouth daily.     ibuprofen 200 MG tablet  Commonly known as:  ADVIL,MOTRIN  Take 400 mg by mouth every 6 (six) hours as needed for mild pain.     ibuprofen 600 MG tablet  Commonly known as:  ADVIL,MOTRIN  Take 1 tablet (600 mg total) by mouth every 6 (six) hours.     oxyCODONE 5 MG immediate release tablet  Commonly known as:  Oxy IR/ROXICODONE  Take 1-2 tablets (5-10 mg total) by mouth every 4 (four) hours as needed for moderate pain.     oxyCODONE-acetaminophen 5-325 MG per tablet  Commonly known as:  PERCOCET/ROXICET  Take 1-2 tablets by mouth every 4 (four) hours as needed (for pain scale less than 7).     SYNTHROID 100 MCG tablet  Generic drug:  levothyroxine  Take 1 tablet (100 mcg total) by mouth daily.     tacrolimus 0.1 % ointment  Commonly known as:  PROTOPIC   Apply 1 application topically daily as needed (dermatitis).           Follow-up Information    Follow up with Earnstine Regal, MD. Schedule an appointment as soon as possible for a visit in 3 weeks.   Specialty:  General Surgery   Why:  For wound re-check   Contact information:   [place] [phone]       Earnstine Regal, MD, [facility] Office: [phone]   Signed: Earnstine Regal 03/13/2015, 7:38 AM

## 2015-03-13 NOTE — Anesthesia Postprocedure Evaluation (Signed)
  Anesthesia Post-op Note  Patient: Pamela [name]  Procedure(s) Performed: Procedure(s) (LRB): TOTAL THYROIDECTOMY (N/A) LYMPH NODE DISSECTION  Patient Location: PACU  Anesthesia Type: General  Level of Consciousness: awake and alert   Airway and Oxygen Therapy: Patient Spontanous Breathing  Post-op Pain: mild  Post-op Assessment: Post-op Vital signs reviewed, Patient's Cardiovascular Status Stable, Respiratory Function Stable, Patent Airway and No signs of Nausea or vomiting  Last Vitals:  Filed Vitals:   03/13/15 0414  BP: 132/82  Pulse: 62  Temp: 36.7 C  Resp: 16    Post-op Vital Signs: stable   Complications: No apparent anesthesia complications

## 2015-03-17 ENCOUNTER — Telehealth: Payer: Self-pay | Admitting: Internal Medicine

## 2015-03-17 NOTE — Telephone Encounter (Signed)
It's OK, let's keep the appt on Thu. It is soon, but that is OK.

## 2015-03-17 NOTE — Telephone Encounter (Signed)
Pt had surgery this past Thursday needs to see Dr.Gherghe for follow but was told  To come 3 week post op. Dr Cruzita Lederer will be on vacation , I scheduled pt for apt this Thursday 03/19/2015 , if this is too early please call pt back  So she can cancel apt. Either way pt would like a call back to make sure this is ok. If pt doesn't come in this Thursday she cannot get apt with dr. Cruzita Lederer  Until August.

## 2015-03-17 NOTE — Telephone Encounter (Signed)
Called pt and advised her ok to come for appt on Thurs.

## 2015-03-17 NOTE — Telephone Encounter (Signed)
Please read message below and advise.  

## 2015-03-19 ENCOUNTER — Encounter: Payer: Self-pay | Admitting: Internal Medicine

## 2015-03-19 ENCOUNTER — Other Ambulatory Visit (INDEPENDENT_AMBULATORY_CARE_PROVIDER_SITE_OTHER): Payer: No Typology Code available for payment source

## 2015-03-19 ENCOUNTER — Ambulatory Visit (INDEPENDENT_AMBULATORY_CARE_PROVIDER_SITE_OTHER): Payer: No Typology Code available for payment source | Admitting: Internal Medicine

## 2015-03-19 VITALS — BP 114/70 | HR 74 | Temp 98.1°F | Resp 12 | Wt 207.8 lb

## 2015-03-19 DIAGNOSIS — E890 Postprocedural hypothyroidism: Secondary | ICD-10-CM

## 2015-03-19 DIAGNOSIS — C73 Malignant neoplasm of thyroid gland: Secondary | ICD-10-CM | POA: Diagnosis not present

## 2015-03-19 NOTE — Progress Notes (Addendum)
Patient ID: Pamela [name], female   DOB: [birth year], 44 y.o.   MRN: [id number]   HPI  Pamela [name] is a 44 y.o.-year-old female, initially referred by Dr. Dellis Filbert, for evaluation and management of PTC.  PTC history: She had an exam with ObGyn on 12/15/2014 >> Dr. Dellis Filbert felt a lump in neck.  Thyroid U/S (02/23/2015): Hypoechoic, solid right-sided isthmus nodule measures approximately 1.4 x 0.6 x 1.0 cm. There are a few scattered areas of peripheral calcification in this nodule.    FNA (02/25/2015): FINDINGS CONSISTENT WITH PAPILLARY CARCINOMA (B[place]ESDA CATEGORY VI).  Thyroidectomy (03/12/2015): 1. Thyroid, thyroidectomy, total thyroid sutures right lobe - MULTIFOCAL PAPILLARY THYROID CARCINOMA, TWO FOCI MEASURING 1.1 CM AND 0.6 CM IN GREATEST DIMENSION. - MARGINS ARE NEGATIVE. - ONE BENIGN LYMPH NODE WITH NO TUMOR SEEN (0/1). - SEE ONCOLOGY TEMPLATE. ADDITIONAL FINDINGS: - INCIDENTAL BENIGN PARATHYROID TISSUE (0.3 CM). 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 2. Lymph nodes, regional resection, central compartment - THREE BENIGN LYMPH NODES WITH NO TUMOR SEEN (0/3). - INCIDENTAL BENIGN THYMIC TISSUE.  1. THYROID Specimen: Total thyroid with central compartment lymph nodes. Procedure: Total thyroidectomy with central compartment lymph node dissection. Specimen Integrity (intact/fragmented): Intact. Tumor focality: Multifocal, two foci.  Dominant tumor: Maximum tumor size (cm): 1.1 cm Tumor laterality: Isthmus (central). Histologic type (including subtype and/or unique features as applicable): Papillary thyroid carcinoma, conventional type. Tumor capsule: No tumor capsule identified. Extrathyroidal extension: No. Margins: Negative. Lymph - Vascular invasion: Not identified. Capsular invasion with degree of invasion if present: Not applicable.  Second tumor: Tumor size(s): 0.6 cm Tumor laterality: Right superior. Histologic type (including subtype and/or unique features  as applicable): Papillary thyroid carcinoma, conventional type. Tumor capsule: No tumor capsule identified. Extrathyroidal extension: No. Margins: Negative. Lymph - Vascular invasion: Not identified. Capsular invasion with degree of invasion if present: Not applicable. Lymph nodes: # examined 4; # positive; 0.  I reviewed pt's thyroid tests: 12/15/2014: TSH 1.23   She is taking the Synthroid: - in am - with water - separated by b'fast in am - Used to take it along with calcium, now calcium stopped  Pt describes: - No weight gain - No fatigue - no cold intolerance - no depression - no constipation - no dry skin - no hair loss  She had low calcium after the surgery:  03/13/2015: 8.6  03/16/2015: 10.0 After last level of calcium, she stopped 1000 mg 3x a day.  I reviewed her chart and she also has a history of HTN during pregnancy >> son born 10/2014.   ROS: Constitutional: no weight gain/loss, no fatigue, no subjective hyperthermia/hypothermia Eyes: no blurry vision, no xerophthalmia ENT: no sore throat, no nodules palpated in throat, no dysphagia/odynophagia, no hoarseness Cardiovascular: no CP/SOB/palpitations/leg swelling Respiratory: + cough/no SOB Gastrointestinal: + Occasional  N/no V/D/C Musculoskeletal: no muscle/joint aches Skin: no rashes Neurological: no tremors/numbness/tingling/dizziness, + HA Psychiatric: no depression/anxiety  Past Medical History  Diagnosis Date  . Genital warts     removed  . Termination of pregnancy (fetus)     x 1  . Newborn product of in vitro fertilization (IVF) pregnancy   . Dysrhythmia     resolved- "grew out of it" - no problem since teenager  . Hypertension 2016    [place] with 2016 pregnancy / no BP meds since birth of child  . Dermatitis   . Thyroid cancer    Past Surgical History  Procedure Laterality Date  . Bladder surgery  1992    Widened  the urethra  . Tubal ligation    . Myomectomy  2008  . Myomectomy  2014      Removed tubes bilaterally  . Wisdom tooth extraction    . Cesarean section N/A 11/04/2014    Procedure: CESAREAN SECTION;  Surgeon: [name], MD;  Location: [facility];  Service: Obstetrics;  Laterality: N/A;  EDD: 11/23/14  . Myomectomy N/A 11/04/2014    Procedure: MYOMECTOMY;  Surgeon: [name], MD;  Location: [facility];  Service: Obstetrics;  Laterality: N/A;  . Thyroidectomy N/A 03/12/2015    Procedure: TOTAL THYROIDECTOMY;  Surgeon: Armandina Gemma, MD;  Location: [facility];  Service: General;  Laterality: N/A;  . Lymph node dissection  03/12/2015    Procedure: LYMPH NODE DISSECTION;  Surgeon: Armandina Gemma, MD;  Location: [facility];  Service: General;;   History   Social History  . Marital Status: Married    Spouse Name: N/A  . Number of Children: 1   Occupational History  .  accountant    Social History Main Topics  . Smoking status: Former Smoker -- 1.00 packs/day for 20 years    Types: Cigarettes    Quit date: 08/11/2007  . Smokeless tobacco: Never Used  . Alcohol Use: Yes     Comment: Beer/liquor 1 drink once a week   . Drug Use: Yes    Special: Marijuana, Cocaine     Comment: 25 years ago "Acid"  , last use cocaine/marijuana 25 yrs ago   Current Outpatient Prescriptions on File Prior to Visit  Medication Sig Dispense Refill  . calcium carbonate (OS-CAL - DOSED IN MG OF ELEMENTAL CALCIUM) 1250 (500 CA) MG tablet Take 2 tablets (1,000 mg of elemental calcium total) by mouth 3 (three) times daily with meals. 60 tablet 2  . hydrALAZINE (APRESOLINE) 10 MG tablet Take 0.5 tablets (5 mg total) by mouth daily. 30 tablet 0  . hydrochlorothiazide (MICROZIDE) 12.5 MG capsule Take 1 capsule (12.5 mg total) by mouth daily. 5 capsule 0  . ibuprofen (ADVIL,MOTRIN) 200 MG tablet Take 400 mg by mouth every 6 (six) hours as needed for mild pain.    Marland Kitchen ibuprofen (ADVIL,MOTRIN) 600 MG tablet Take 1 tablet (600 mg total) by mouth every 6 (six) hours. 30 tablet 0  . oxyCODONE (OXY  IR/ROXICODONE) 5 MG immediate release tablet Take 1-2 tablets (5-10 mg total) by mouth every 4 (four) hours as needed for moderate pain. 20 tablet 0  . oxyCODONE-acetaminophen (PERCOCET/ROXICET) 5-325 MG per tablet Take 1-2 tablets by mouth every 4 (four) hours as needed (for pain scale less than 7). 30 tablet 0  . SYNTHROID 100 MCG tablet Take 1 tablet (100 mcg total) by mouth daily. 30 tablet 3  . tacrolimus (PROTOPIC) 0.1 % ointment Apply 1 application topically daily as needed (dermatitis).     No current facility-administered medications on file prior to visit.   Allergies  Allergen Reactions  . Augmentin [Amoxicillin-Pot Clavulanate] Nausea And Vomiting  . Erythromycin Nausea And Vomiting   No family history available. Patient is adopted.  PE: BP 114/70 mmHg  Pulse 74  Temp(Src) 98.1 F (36.7 C) (Oral)  Resp 12  Wt 207 lb 12.8 oz (94.257 kg)  SpO2 97%  LMP 02/19/2015 Wt Readings from Last 3 Encounters:  03/19/15 207 lb 12.8 oz (94.257 kg)  03/12/15 209 lb 9 oz (95.057 kg)  03/11/15 209 lb 9 oz (95.057 kg)   Constitutional: overweight, in NAD Eyes: PERRLA, EOMI, no exophthalmos ENT: moist mucous membranes, thyroidectomy  scar healing, swelling around the scar, and petechiae under the tape Cardiovascular: RRR, No MRG Respiratory: CTA B Gastrointestinal: abdomen soft, NT, ND, BS+ Musculoskeletal: no deformities, strength intact in all 4 Skin: moist, warm, no rashes Neurological: no tremor with outstretched hands, DTR normal in all 4  ASSESSMENT: 1. Bifocal papillary thyroid cancer  2. Postsurgical hypothyroidism  3. Postsurgical hypocalcemia   PLAN: 1. Papillary thyroid cancer - she tolerated well her recent total thyroidectomy - has minimal swelling, no pain or erythema - I had a long discussion with the patient about her recent diagnosis of papillary thyroid cancer.  reviewed the pathology  underlined multifocality, but the second focus is  micro-PTC  Underlined that it was not encapsulated  did not spread to the lymph vessels  Margins are free of disease, as are lymph vessels  discussed prognosis: Very good, she is stage I  discussed that further treatment with RAI is not necessary for such a small cancer, despite bifocality  TSH goals (close to the LLN in the first year, then a little more lax).  - we discussed about follow-up, which would be by ultrasound, with the first ultrasound approximately a year after her surgery, and then less frequently afterwards - We will check her thyroid tests 5-6 weeks after the surgery - Return in about 6 months (around 09/18/2015).  2. Postsurgical hypothyroidism - Patient is on d.a.w. Synthroid 100 g daily - She was taking this with calcium, which was stopped 2 days ago - I advised her to take the thyroid hormone every day, with water, >30 minutes before breakfast, separated by >4 hours from acid reflux medications, calcium, iron, multivitamins. - We'll check TSH, free T4 in 5-6 weeks after the surgery   3. Postsurgical hypocalcemia  - She was on 1000 mg of calcium 3 times a day, which was stopped after her last calcium returned normal, on 03/16/2015 - She has some symptoms that may be suggestive of hypocalcemia: paresthesias, occasional extremity tingling - I advised her to restart calcium, 500 mg twice a day and tapered down slowly - Will check a calcium and a magnesium level today  . - time spent with the patient: 40 minutes, of which >50% was spent in reviewing her previous labsurgical reports and treatment, and counseling her about her conditions (please see the discussed topics above), and developing a plan to further investigate them;  she had a number of questions which I addressed.  Unfortunately, the wrong labs were drawn: Thyroid tests (which were ordered as future labs) instead of calcium and magnesium. Patient will need to return to have these labs drawn. Lab on  03/19/2015  Component Date Value Ref Range Status  . Free T4 03/19/2015 0.91  0.60 - 1.60 ng/dL Final  . TSH 03/19/2015 1.54  0.35 - 4.50 uIU/mL Final   Component     Latest Ref Rng 03/20/2015  Magnesium     1.5 - 2.5 mg/dL 1.4 (L)  Calcium     8.4 - 10.5 mg/dL 8.0 (L)    Hypocalcemia and hypomagnesemia. I will advise the patient to start magnesium 500 mg daily. Also, increase  calcium to 1000 mg twice a day, with lunch and dinner, and remain on this dose until we recheck her calcium and magnesium in ~7-10 days.

## 2015-03-19 NOTE — Patient Instructions (Signed)
Please stop at the lab.  Restart Calcium 500 mg 2x a day x 4 days, then 1x a day for 4 more, then stop.  Take the thyroid hormone every day, with water, >30 minutes before breakfast, separated by >4 hours from acid reflux medications, calcium, iron, multivitamins.  Please return in 1 month with your sugar log.

## 2015-03-20 ENCOUNTER — Other Ambulatory Visit (INDEPENDENT_AMBULATORY_CARE_PROVIDER_SITE_OTHER): Payer: No Typology Code available for payment source

## 2015-03-20 ENCOUNTER — Other Ambulatory Visit: Payer: Self-pay | Admitting: *Deleted

## 2015-03-20 LAB — CALCIUM: Calcium: 8 mg/dL — ABNORMAL LOW (ref 8.4–10.5)

## 2015-03-20 LAB — T4, FREE: Free T4: 0.91 ng/dL (ref 0.60–1.60)

## 2015-03-20 LAB — MAGNESIUM: Magnesium: 1.4 mg/dL — ABNORMAL LOW (ref 1.5–2.5)

## 2015-03-20 LAB — TSH: TSH: 1.54 u[IU]/mL (ref 0.35–4.50)

## 2015-03-20 NOTE — Addendum Note (Signed)
Addended by: Philemon Kingdom on: 03/20/2015 05:39 PM   Modules accepted: Orders, Level of Service

## 2015-03-23 ENCOUNTER — Encounter: Payer: Self-pay | Admitting: Internal Medicine

## 2015-03-29 ENCOUNTER — Encounter (HOSPITAL_BASED_OUTPATIENT_CLINIC_OR_DEPARTMENT_OTHER): Payer: Self-pay | Admitting: *Deleted

## 2015-03-29 ENCOUNTER — Emergency Department (HOSPITAL_BASED_OUTPATIENT_CLINIC_OR_DEPARTMENT_OTHER)
Admission: EM | Admit: 2015-03-29 | Discharge: 2015-03-29 | Disposition: A | Discharge status: Home / Self Care | Payer: No Typology Code available for payment source | Attending: Emergency Medicine | Admitting: Emergency Medicine

## 2015-03-29 DIAGNOSIS — Z87891 Personal history of nicotine dependence: Secondary | ICD-10-CM | POA: Insufficient documentation

## 2015-03-29 DIAGNOSIS — R221 Localized swelling, mass and lump, neck: Secondary | ICD-10-CM | POA: Diagnosis not present

## 2015-03-29 DIAGNOSIS — Z79899 Other long term (current) drug therapy: Secondary | ICD-10-CM | POA: Diagnosis not present

## 2015-03-29 DIAGNOSIS — I10 Essential (primary) hypertension: Secondary | ICD-10-CM | POA: Insufficient documentation

## 2015-03-29 DIAGNOSIS — Z85850 Personal history of malignant neoplasm of thyroid: Secondary | ICD-10-CM | POA: Insufficient documentation

## 2015-03-29 DIAGNOSIS — Z8619 Personal history of other infectious and parasitic diseases: Secondary | ICD-10-CM | POA: Diagnosis not present

## 2015-03-29 DIAGNOSIS — Z872 Personal history of diseases of the skin and subcutaneous tissue: Secondary | ICD-10-CM | POA: Diagnosis not present

## 2015-03-29 LAB — CBC WITH DIFFERENTIAL/PLATELET
Basophils Absolute: 0 10*3/uL (ref 0.0–0.1)
Basophils Relative: 0 % (ref 0–1)
Eosinophils Absolute: 0.4 10*3/uL (ref 0.0–0.7)
Eosinophils Relative: 3 % (ref 0–5)
HCT: 39.6 % (ref 36.0–46.0)
Hemoglobin: 13.5 g/dL (ref 12.0–15.0)
Lymphocytes Relative: 17 % (ref 12–46)
Lymphs Abs: 2.2 10*3/uL (ref 0.7–4.0)
MCH: 29.2 pg (ref 26.0–34.0)
MCHC: 34.1 g/dL (ref 30.0–36.0)
MCV: 85.7 fL (ref 78.0–100.0)
Monocytes Absolute: 0.9 10*3/uL (ref 0.1–1.0)
Monocytes Relative: 7 % (ref 3–12)
Neutro Abs: 9.9 10*3/uL — ABNORMAL HIGH (ref 1.7–7.7)
Neutrophils Relative %: 73 % (ref 43–77)
Platelets: 269 10*3/uL (ref 150–400)
RBC: 4.62 MIL/uL (ref 3.87–5.11)
RDW: 12.6 % (ref 11.5–15.5)
WBC: 13.4 10*3/uL — ABNORMAL HIGH (ref 4.0–10.5)

## 2015-03-29 LAB — BASIC METABOLIC PANEL
Anion gap: 16 — ABNORMAL HIGH (ref 5–15)
BUN: 10 mg/dL (ref 6–20)
CO2: 24 mmol/L (ref 22–32)
Calcium: 8.4 mg/dL — ABNORMAL LOW (ref 8.9–10.3)
Chloride: 100 mmol/L — ABNORMAL LOW (ref 101–111)
Creatinine, Ser: 0.82 mg/dL (ref 0.44–1.00)
GFR calc Af Amer: >60 mL/min (ref >60)
GFR calc non Af Amer: >60 mL/min (ref >60)
Glucose, Bld: 108 mg/dL — ABNORMAL HIGH (ref 65–99)
Potassium: 4 mmol/L (ref 3.5–5.1)
Sodium: 140 mmol/L (ref 135–145)

## 2015-03-29 MED ORDER — CEPHALEXIN 250 MG PO CAPS
500.0000 mg | ORAL_CAPSULE | Freq: Once | ORAL | Status: AC
  Administered 2015-03-29: 500 mg via ORAL
  Filled 2015-03-29: qty 2

## 2015-03-29 MED ORDER — CEPHALEXIN 500 MG PO CAPS: 500.0000 mg | ORAL_CAPSULE | Freq: Three times a day (TID) | ORAL | Status: AC

## 2015-03-29 MED ORDER — OXYCODONE-ACETAMINOPHEN 5-325 MG PO TABS: 2.0000 | ORAL_TABLET | ORAL | Status: DC | PRN

## 2015-03-29 NOTE — ED Notes (Signed)
Presents with thyroid incisional redness and swelling, no dysphagia or dysphonia noted at this time.

## 2015-03-29 NOTE — ED Provider Notes (Signed)
CSN: [id number]     Arrival date & time 03/29/15  9024 History   First MD Initiated Contact with Patient 03/29/15 [phone]     No chief complaint on file.    (Consider location/radiation/quality/duration/timing/severity/associated sxs/prior Treatment) HPI  44 year old female presents today with swelling and pain in her anterior neck after thyroidectomy performed 17 days ago. She had follow-up and is reported not to do any further treatment for her carcinoma. She had been doing well operatively but noted some swelling, redness, and pain that began in her anterior neck around the site of the incision during the night. She has not noted any fever, difficulty swallowing, or speaking. She describes it as tender, red, and warm.  Past Medical History  Diagnosis Date  . Genital warts     removed  . Termination of pregnancy (fetus)     x 1  . Newborn product of in vitro fertilization (IVF) pregnancy   . Dysrhythmia     resolved- "grew out of it" - no problem since teenager  . Hypertension 2016    [place] with 2016 pregnancy / no BP meds since birth of child  . Dermatitis   . Thyroid cancer    Past Surgical History  Procedure Laterality Date  . Bladder surgery  1992    Widened the urethra  . Tubal ligation    . Myomectomy  2008  . Myomectomy  2014    Removed tubes bilaterally  . Wisdom tooth extraction    . Cesarean section N/A 11/04/2014    Procedure: CESAREAN SECTION;  Surgeon: [name], MD;  Location: [facility];  Service: Obstetrics;  Laterality: N/A;  EDD: 11/23/14  . Myomectomy N/A 11/04/2014    Procedure: MYOMECTOMY;  Surgeon: [name], MD;  Location: [facility];  Service: Obstetrics;  Laterality: N/A;  . Thyroidectomy N/A 03/12/2015    Procedure: TOTAL THYROIDECTOMY;  Surgeon: Armandina Gemma, MD;  Location: [facility];  Service: General;  Laterality: N/A;  . Lymph node dissection  03/12/2015    Procedure: LYMPH NODE DISSECTION;  Surgeon: Armandina Gemma, MD;  Location: [facility];  Service:  General;;   History reviewed. No pertinent family history. History  Substance Use Topics  . Smoking status: Former Smoker -- 1.00 packs/day for 20 years    Types: Cigarettes    Quit date: 08/11/2007  . Smokeless tobacco: Never Used  . Alcohol Use: Yes     Comment: Socially PRIOR to pregnancy   OB History    Gravida Para Term Preterm AB TAB SAB Ectopic Multiple Living   2 1 1  1 1    0 1     Review of Systems  All other systems reviewed and are negative.     Allergies  Augmentin and Erythromycin  Home Medications   Prior to Admission medications   Medication Sig Start Date End Date Taking? Authorizing Provider  calcium carbonate (OS-CAL - DOSED IN MG OF ELEMENTAL CALCIUM) 1250 (500 CA) MG tablet Take 2 tablets (1,000 mg of elemental calcium total) by mouth 3 (three) times daily with meals. 03/13/15   Armandina Gemma, MD  hydrALAZINE (APRESOLINE) 10 MG tablet Take 0.5 tablets (5 mg total) by mouth daily. 11/07/14   [name], MD  hydrochlorothiazide (MICROZIDE) 12.5 MG capsule Take 1 capsule (12.5 mg total) by mouth daily. 11/07/14   Graciela Husbands, CNM  ibuprofen (ADVIL,MOTRIN) 200 MG tablet Take 400 mg by mouth every 6 (six) hours as needed for mild pain.    Historical Provider, MD  ibuprofen (ADVIL,MOTRIN) 600 MG tablet Take 1 tablet (600 mg total) by mouth every 6 (six) hours. 11/07/14   Graciela Husbands, CNM  oxyCODONE (OXY IR/ROXICODONE) 5 MG immediate release tablet Take 1-2 tablets (5-10 mg total) by mouth every 4 (four) hours as needed for moderate pain. 03/13/15   Armandina Gemma, MD  oxyCODONE-acetaminophen (PERCOCET/ROXICET) 5-325 MG per tablet Take 1-2 tablets by mouth every 4 (four) hours as needed (for pain scale less than 7). 11/07/14   Graciela Husbands, CNM  SYNTHROID 100 MCG tablet Take 1 tablet (100 mcg total) by mouth daily. 03/13/15   Armandina Gemma, MD  tacrolimus (PROTOPIC) 0.1 % ointment Apply 1 application topically daily as needed (dermatitis).    Historical  Provider, MD   BP 137/92 mmHg  Pulse 75  Temp(Src) 98 F (36.7 C) (Oral)  Resp 18  Ht 5\' 8"  (1.727 m)  Wt 206 lb (93.441 kg)  BMI 31.33 kg/m2  LMP 03/16/2015 (Exact Date) Physical Exam  Constitutional: She appears well-developed and well-nourished.  HENT:  Head: Atraumatic.  Eyes: Pupils are equal, round, and reactive to light.  Neck: Normal range of motion. Neck supple. No JVD present. No tracheal deviation present.    Erythema with warmth 3 x 4 cm area anterior neck over thyroidectomy site  Cardiovascular: Normal rate and normal heart sounds.   Pulmonary/Chest: Effort normal and breath sounds normal. No stridor.  Abdominal: Soft. Bowel sounds are normal.  Musculoskeletal: Normal range of motion.  Lymphadenopathy:    She has no cervical adenopathy.  Nursing note and vitals reviewed.   ED Course  Procedures (including critical care time) Labs Review Labs Reviewed  CBC WITH DIFFERENTIAL/PLATELET - Abnormal; Notable for the following:    WBC 13.4 (*)    Neutro Abs 9.9 (*)    All other components within normal limits  BASIC METABOLIC PANEL    Imaging Review No results found.   EKG Interpretation None      MDM   Final diagnoses:  Localized swelling, mass and lump, neck    Bedside ultrasound reveals hypoechogenic compressible area in thyroid c.w. Fluid.  Seroma vs infected fluid- will consult with surgery.   Discussed with Dr. Harlow Asa- plan keflex, wbc pending- if not >12,000 will d/c on keflex and Dr. Gala Lewandowsky office will call tomorrow for recheck on Wednesday.  Return precautions of worsening swelling, difficulty breathing, swallowing, fever given to trigger return.    Pattricia Boss, MD 03/31/15 [phone]

## 2015-03-29 NOTE — ED Notes (Signed)
MD at bedside. 

## 2015-03-29 NOTE — ED Notes (Signed)
Pt states had total thyroidectomy Clent Demark, MD) June 2nd, 2016. Presents with neck swelling and redness

## 2015-03-29 NOTE — Discharge Instructions (Signed)
Please return immediately if you have worsened swelling, difficulty speaking or breathing, or you develop fever. Dr. Gala Lewandowsky office should contact you tomorrow for recheck on Tuesday.  Your calcium level remains low please have this rechecked with your primary care physician.

## 2015-03-29 NOTE — ED Notes (Signed)
Pt medicated per EDP orders, pt tolerated PO meds w/o difficulty

## 2015-03-31 ENCOUNTER — Other Ambulatory Visit (INDEPENDENT_AMBULATORY_CARE_PROVIDER_SITE_OTHER): Payer: No Typology Code available for payment source

## 2015-03-31 LAB — MAGNESIUM: Magnesium: 1.9 mg/dL (ref 1.5–2.5)

## 2015-03-31 LAB — CALCIUM: Calcium: 8.3 mg/dL — ABNORMAL LOW (ref 8.4–10.5)

## 2015-04-14 ENCOUNTER — Other Ambulatory Visit: Payer: Self-pay | Admitting: Internal Medicine

## 2015-04-14 DIAGNOSIS — E890 Postprocedural hypothyroidism: Secondary | ICD-10-CM

## 2015-04-17 ENCOUNTER — Other Ambulatory Visit (INDEPENDENT_AMBULATORY_CARE_PROVIDER_SITE_OTHER): Payer: No Typology Code available for payment source

## 2015-04-17 ENCOUNTER — Encounter: Payer: Self-pay | Admitting: Internal Medicine

## 2015-04-17 ENCOUNTER — Other Ambulatory Visit: Payer: Self-pay | Admitting: *Deleted

## 2015-04-17 ENCOUNTER — Other Ambulatory Visit: Payer: Self-pay | Admitting: Internal Medicine

## 2015-04-17 DIAGNOSIS — E890 Postprocedural hypothyroidism: Secondary | ICD-10-CM | POA: Diagnosis not present

## 2015-04-17 LAB — TSH: TSH: 3.67 u[IU]/mL (ref 0.35–4.50)

## 2015-04-17 LAB — CALCIUM: Calcium: 8.7 mg/dL (ref 8.4–10.5)

## 2015-04-17 LAB — MAGNESIUM: Magnesium: 1.9 mg/dL (ref 1.5–2.5)

## 2015-04-17 LAB — T4, FREE: Free T4: 0.94 ng/dL (ref 0.60–1.60)

## 2015-04-17 MED ORDER — SYNTHROID 112 MCG PO TABS: 112.0000 ug | ORAL_TABLET | Freq: Every day | ORAL | Status: DC

## 2015-04-17 NOTE — Addendum Note (Signed)
Addended by: Philemon Kingdom on: 04/17/2015 11:54 AM   Modules accepted: Orders, Medications

## 2015-05-28 ENCOUNTER — Ambulatory Visit (INDEPENDENT_AMBULATORY_CARE_PROVIDER_SITE_OTHER): Payer: No Typology Code available for payment source

## 2015-05-28 DIAGNOSIS — E890 Postprocedural hypothyroidism: Secondary | ICD-10-CM

## 2015-05-28 LAB — MAGNESIUM: Magnesium: 1.9 mg/dL (ref 1.5–2.5)

## 2015-05-28 LAB — TSH: TSH: 1.87 u[IU]/mL (ref 0.35–4.50)

## 2015-05-28 LAB — T4, FREE: Free T4: 0.86 ng/dL (ref 0.60–1.60)

## 2015-05-29 ENCOUNTER — Ambulatory Visit: Payer: No Typology Code available for payment source

## 2015-05-29 ENCOUNTER — Other Ambulatory Visit: Payer: Self-pay | Admitting: *Deleted

## 2015-05-29 LAB — CALCIUM, IONIZED: Calcium, Ion: 1.13 mmol/L (ref 1.12–1.32)

## 2015-05-29 MED ORDER — LEVOTHYROXINE SODIUM 125 MCG PO TABS: 125.0000 ug | ORAL_TABLET | Freq: Every day | ORAL | Status: DC

## 2015-06-23 ENCOUNTER — Encounter: Payer: Self-pay | Admitting: Internal Medicine

## 2015-07-03 ENCOUNTER — Ambulatory Visit: Payer: No Typology Code available for payment source | Admitting: Internal Medicine

## 2015-07-30 ENCOUNTER — Other Ambulatory Visit: Payer: Self-pay | Admitting: *Deleted

## 2015-07-30 ENCOUNTER — Other Ambulatory Visit (INDEPENDENT_AMBULATORY_CARE_PROVIDER_SITE_OTHER): Payer: No Typology Code available for payment source

## 2015-07-30 DIAGNOSIS — E890 Postprocedural hypothyroidism: Secondary | ICD-10-CM | POA: Diagnosis not present

## 2015-07-30 LAB — TSH: TSH: 0.48 u[IU]/mL (ref 0.35–4.50)

## 2015-07-30 LAB — T4, FREE: Free T4: 1.24 ng/dL (ref 0.60–1.60)

## 2015-07-31 ENCOUNTER — Other Ambulatory Visit: Payer: Self-pay | Admitting: *Deleted

## 2015-07-31 ENCOUNTER — Telehealth: Payer: Self-pay | Admitting: Internal Medicine

## 2015-07-31 NOTE — Telephone Encounter (Signed)
The two addons-magnesium and and ionized calcium which we can't add on we will have to call pt in for these.

## 2015-08-03 ENCOUNTER — Other Ambulatory Visit: Payer: No Typology Code available for payment source

## 2015-08-03 ENCOUNTER — Encounter: Payer: Self-pay | Admitting: Internal Medicine

## 2015-08-25 ENCOUNTER — Other Ambulatory Visit: Payer: Self-pay | Admitting: Internal Medicine

## 2015-09-18 ENCOUNTER — Ambulatory Visit (INDEPENDENT_AMBULATORY_CARE_PROVIDER_SITE_OTHER): Payer: BLUE CROSS/BLUE SHIELD | Admitting: Internal Medicine

## 2015-09-18 ENCOUNTER — Encounter: Payer: Self-pay | Admitting: Internal Medicine

## 2015-09-18 VITALS — BP 114/62 | HR 61 | Temp 98.0°F | Resp 12 | Wt 212.0 lb

## 2015-09-18 DIAGNOSIS — E890 Postprocedural hypothyroidism: Secondary | ICD-10-CM

## 2015-09-18 DIAGNOSIS — C73 Malignant neoplasm of thyroid gland: Secondary | ICD-10-CM | POA: Diagnosis not present

## 2015-09-18 LAB — TSH: TSH: 1.6 u[IU]/mL (ref 0.35–4.50)

## 2015-09-18 LAB — MAGNESIUM: Magnesium: 1.9 mg/dL (ref 1.5–2.5)

## 2015-09-18 LAB — T4, FREE: Free T4: 0.91 ng/dL (ref 0.60–1.60)

## 2015-09-18 NOTE — Patient Instructions (Signed)
Please stop at the lab.  Please stop calcium for now.  Continue Magnesium 500 mg daily.  Please come back for a follow-up appointment in 6 months.

## 2015-09-18 NOTE — Progress Notes (Signed)
[name] ID: Pamela [name], female   DOB: 1971-07-31, 44 y.o.   MRN: [id number]   HPI  Pamela [name] is a 44 y.o.-year-old female, initially referred by Dr. Dellis [name], returning for follow-up for h/o papillary thyroid cancer, now with postsurgical hypothyroidism and also postsurgical hypocalcemia and hypomagnesemia. Last visit 6 months ago.  PTC history: She had an exam with ObGyn on 12/15/2014 >> Dr. Dellis [name] felt a lump in neck.  Thyroid U/S (02/23/2015): Hypoechoic, solid right-sided isthmus nodule measures approximately 1.4 x 0.6 x 1.0 cm. There are a few scattered areas of peripheral calcification in this nodule.    FNA (02/25/2015): FINDINGS CONSISTENT WITH PAPILLARY CARCINOMA (Pamela [name]).  Thyroidectomy (03/12/2015): 1. Thyroid, thyroidectomy, total thyroid sutures right lobe - MULTIFOCAL PAPILLARY THYROID CARCINOMA, TWO FOCI MEASURING 1.1 CM AND 0.6 CM IN GREATEST DIMENSION. - MARGINS ARE NEGATIVE. - ONE BENIGN LYMPH NODE WITH NO TUMOR SEEN (0/1). - SEE ONCOLOGY TEMPLATE. ADDITIONAL FINDINGS: - INCIDENTAL BENIGN PARATHYROID TISSUE (0.3 CM). 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 2. Lymph nodes, regional resection, central compartment - THREE BENIGN LYMPH NODES WITH NO TUMOR SEEN (0/3). - INCIDENTAL BENIGN THYMIC TISSUE.  1. THYROID Specimen: Total thyroid with central compartment lymph nodes. Procedure: Total thyroidectomy with central compartment lymph node dissection. Specimen Integrity (intact/fragmented): Intact. Tumor focality: Multifocal, two foci.  Dominant tumor: Maximum tumor size (cm): 1.1 cm Tumor laterality: Isthmus (central). Histologic type (including subtype and/or unique features as applicable): Papillary thyroid carcinoma, conventional type. Tumor capsule: No tumor capsule identified. Extrathyroidal extension: No. Margins: Negative. Lymph - Vascular invasion: Not identified. Capsular invasion with degree of invasion if present: Not  applicable.  Second tumor: Tumor size(s): 0.6 cm Tumor laterality: Right superior. Histologic type (including subtype and/or unique features as applicable): Papillary thyroid carcinoma, conventional type. Tumor capsule: No tumor capsule identified. Extrathyroidal extension: No. Margins: Negative. Lymph - Vascular invasion: Not identified. Capsular invasion with degree of invasion if present: Not applicable. Lymph nodes: # examined 4; # positive; 0.  I reviewed pt's thyroid tests - at goal: Lab Results  Component Value Date   TSH 0.48 07/30/2015   TSH 1.87 05/28/2015   TSH 3.67 04/17/2015   TSH 1.54 03/19/2015   FREET4 1.24 07/30/2015   FREET4 0.86 05/28/2015   FREET4 0.94 04/17/2015   FREET4 0.91 03/19/2015   She is taking LT4 125 mcg: - daily - in am - with water - separated by b'fast in am - Used to take it along with calcium, now calcium stopped  Pt describes: - No weight gain - + fatigue - + heat and cold intolerance - no depression - + diarrhea/constipation - no dry skin - + hair loss  She had low calcium after Pamela surgery >> At last visit, I advised Pamela [name] to start magnesium 500 mg daily. Also, increase  calcium to 1000 mg twice a day, with lunch and dinner. Subsequent calcium and magnesium levels have been normal. She tells me in Pamela last 2 weeks, she took Calcium 3x a week.  Component     Latest Ref Rng 03/20/2015 03/31/2015 04/17/2015 05/28/2015  Magnesium     1.5 - 2.5 mg/dL 1.4 (L) 1.9 1.9 1.9  Calcium     8.4 - 10.5 mg/dL 8.0 (L) 8.3 (L) 8.7   Calcium Ionized     1.12 - 1.32 mmol/L    1.13  03/16/2015: 10.0 - she stopped 1000 mg 3x a day.  She  also has a history of HTN during pregnancy >>  son born 10/2014.   ROS: Constitutional: no weight gain/loss, no fatigue, no subjective hyperthermia/hypothermia Eyes: no blurry vision, no xerophthalmia ENT: + sore throat, no nodules palpated in throat, no dysphagia/odynophagia, + hoarseness Cardiovascular:  no CP/SOB/palpitations/leg swelling Respiratory: + cough/no SOB Gastrointestinal: + no N/V/+ D/+ C Musculoskeletal: no muscle/joint aches Skin: no rashes Neurological: no tremors/numbness/tingling/dizziness, + HA  I reviewed pt's medications, allergies, PMH, social hx, family hx, and changes were documented in Pamela history of present illness. Otherwise, unchanged from my initial visit note.  Past Medical History  Diagnosis Date  . Genital warts     removed  . Termination of pregnancy (fetus)     x 1  . Newborn product of in vitro fertilization (IVF) pregnancy   . Dysrhythmia     resolved- "grew out of it" - no problem since teenager  . Hypertension 2016    [place] with 2016 pregnancy / no BP meds since birth of child  . Dermatitis   . Thyroid cancer    Past Surgical History  Procedure Laterality Date  . Bladder surgery  1992    Widened Pamela urethra  . Tubal ligation    . Myomectomy  2008  . Myomectomy  2014    Removed tubes bilaterally  . Wisdom tooth extraction    . Cesarean section N/A 11/04/2014    Procedure: CESAREAN SECTION;  Surgeon: Pamela Bruins, MD;  Location: [facility];  Service: Obstetrics;  Laterality: N/A;  EDD: 11/23/14  . Myomectomy N/A 11/04/2014    Procedure: MYOMECTOMY;  Surgeon: Pamela Bruins, MD;  Location: [facility];  Service: Obstetrics;  Laterality: N/A;  . Thyroidectomy N/A 03/12/2015    Procedure: TOTAL THYROIDECTOMY;  Surgeon: Pamela Gemma, MD;  Location: [facility];  Service: General;  Laterality: N/A;  . Lymph node dissection  03/12/2015    Procedure: LYMPH NODE DISSECTION;  Surgeon: Pamela Gemma, MD;  Location: [facility];  Service: General;;   History   Social History  . Marital Status: Married    Spouse Name: N/A  . Number of Children: 1   Occupational History  .  accountant    Social History Main Topics  . Smoking status: Former Smoker -- 1.00 packs/day for 20 years    Types: Cigarettes    Quit date: 08/11/2007  . Smokeless tobacco: Never Used  .  Alcohol Use: Yes     Comment: Beer/liquor 1 drink once a week   . Drug Use: Yes    Special: Marijuana, Cocaine     Comment: 25 years ago "Acid"  , last use cocaine/marijuana 25 yrs ago   Current Outpatient Prescriptions on File Prior to Visit  Medication Sig Dispense Refill  . calcium carbonate (OS-CAL - DOSED IN MG OF ELEMENTAL CALCIUM) 1250 (500 CA) MG tablet Take 2 tablets (1,000 mg of elemental calcium total) by mouth 3 (three) times daily with meals. 60 tablet 2  . hydrALAZINE (APRESOLINE) 10 MG tablet Take 0.5 tablets (5 mg total) by mouth daily. 30 tablet 0  . hydrochlorothiazide (MICROZIDE) 12.5 MG capsule Take 1 capsule (12.5 mg total) by mouth daily. 5 capsule 0  . ibuprofen (ADVIL,MOTRIN) 200 MG tablet Take 400 mg by mouth every 6 (six) hours as needed for mild pain.    Marland Kitchen ibuprofen (ADVIL,MOTRIN) 600 MG tablet Take 1 tablet (600 mg total) by mouth every 6 (six) hours. 30 tablet 0  . levothyroxine (SYNTHROID, LEVOTHROID) 125 MCG tablet TAKE 1 TABLET(125 MCG) BY MOUTH DAILY BEFORE BREAKFAST 45 tablet 2  .  oxyCODONE (OXY IR/ROXICODONE) 5 MG immediate release tablet Take 1-2 tablets (5-10 mg total) by mouth every 4 (four) hours as needed for moderate pain. 20 tablet 0  . oxyCODONE-acetaminophen (PERCOCET/ROXICET) 5-325 MG per tablet Take 2 tablets by mouth every 4 (four) hours as needed for severe pain. 15 tablet 0  . tacrolimus (PROTOPIC) 0.1 % ointment Apply 1 application topically daily as needed (dermatitis).     No current facility-administered medications on file prior to visit.   Allergies  Allergen Reactions  . Augmentin [Amoxicillin-Pot Clavulanate] Nausea And Vomiting  . Erythromycin Nausea And Vomiting   No family history available. [name] is adopted.  PE: BP 114/62 mmHg  Pulse 61  Temp(Src) 98 F (36.7 C) (Oral)  Resp 12  Wt 212 lb (96.163 kg)  SpO2 97% Body mass index is 32.24 kg/(m^2). Wt Readings from Last 3 Encounters:  09/18/15 212 lb (96.163 kg)    03/29/15 206 lb (93.441 kg)  03/19/15 207 lb 12.8 oz (94.257 kg)   Constitutional: overweight, in NAD Eyes: PERRLA, EOMI, no exophthalmos ENT: moist mucous membranes, thyroidectomy scar healing, no swelling around Pamela scar, no masses palpated in neck Cardiovascular: RRR, No MRG Respiratory: CTA B Gastrointestinal: abdomen soft, NT, ND, BS+ Musculoskeletal: no deformities, strength intact in all 4 Skin: moist, warm, no rashes Neurological: no tremor with outstretched hands, DTR normal in all 4  ASSESSMENT: 1. Bifocal papillary thyroid cancer  2. Postsurgical hypothyroidism  3. Postsurgical hypocalcemia   PLAN: 1. Papillary thyroid cancer - no cervical  scar swelling, no pain or erythema - reviewed papillary thyroid cancer features:  reviewed Pamela pathology  multifocal, but Pamela second focus is micro-PTC  not encapsulated  did not spread to Pamela lymph vessels  Margins are free of disease, as are lymph vessels  prognosis: Very good, she is stage I  treatment with RAI is not necessary for such a small cancer, despite bifocality  TSH goals (low normal range) - we discussed about follow-up, which would be by ultrasound, with Pamela first ultrasound approximately a year after her surgery (03/2016), but will also check Tg + ATA today - Return in about 6 months (around 03/18/2016).  2. Postsurgical hypothyroidism - [name] is on LT4 125 g daily - I advised her to take Pamela thyroid hormone every day, with water, >30 minutes before breakfast, separated by >4 hours from acid reflux medications, calcium, iron, multivitamins. - We'll check TSH, free T4 today   3. Postsurgical hypocalcemia  - She was on 1000 mg of calcium 2 times a day, but only took this 3x in last 2 weeks >> no tingling or other sxs - I advised her to stop calcium for now, but continue Mg - Will check a calcium and a magnesium level today   Component     Latest Ref Rng 09/18/2015  Free T4     0.60 - 1.60 ng/dL  0.91  TSH     0.35 - 4.50 uIU/mL 1.60  Magnesium     1.5 - 2.5 mg/dL 1.9  Calcium Ionized     1.12 - 1.32 mmol/L 1.15  Thyroglobulin     2.8 - 40.9 ng/mL 0.2 (L)  Thyroglobulin Ab     <2 IU/mL <1   Low risk ThyCA >> will continue current LT4 dose. Continue to stay off calcium.

## 2015-09-19 LAB — CALCIUM, IONIZED: Calcium, Ion: 1.15 mmol/L (ref 1.12–1.32)

## 2015-09-19 LAB — THYROGLOBULIN LEVEL: Thyroglobulin: 0.2 ng/mL — ABNORMAL LOW (ref 2.8–40.9)

## 2015-09-19 LAB — THYROGLOBULIN ANTIBODY: Thyroglobulin Ab: <1 IU/mL (ref <2)

## 2016-01-06 ENCOUNTER — Other Ambulatory Visit: Payer: Self-pay | Admitting: *Deleted

## 2016-01-06 ENCOUNTER — Encounter: Payer: Self-pay | Admitting: Internal Medicine

## 2016-01-06 MED ORDER — SYNTHROID 125 MCG PO TABS: 125.0000 ug | ORAL_TABLET | Freq: Every day | ORAL | Status: DC

## 2016-01-27 IMAGING — US US OB COMP LESS 14 WK
1 series · 13 of 28 positions shown · non-contrast
Comparison: None.

CLINICAL DATA: 43-year-old pregnant female with bleeding. IVF with
estimated gestational age of 5 weeks 6 days. Beta HCG of [DATE].

EXAM:
OBSTETRIC <14 WK US AND TRANSVAGINAL OB US
TECHNIQUE: Both transabdominal and transvaginal ultrasound examinations were
performed for complete evaluation of the gestation as well as the
maternal uterus, adnexal regions, and pelvic cul-de-sac.
Transvaginal technique was performed to assess early pregnancy.

[Series 1: us ob comp less 14 wks · 13 of 57 slices shown]
[im 3/57]
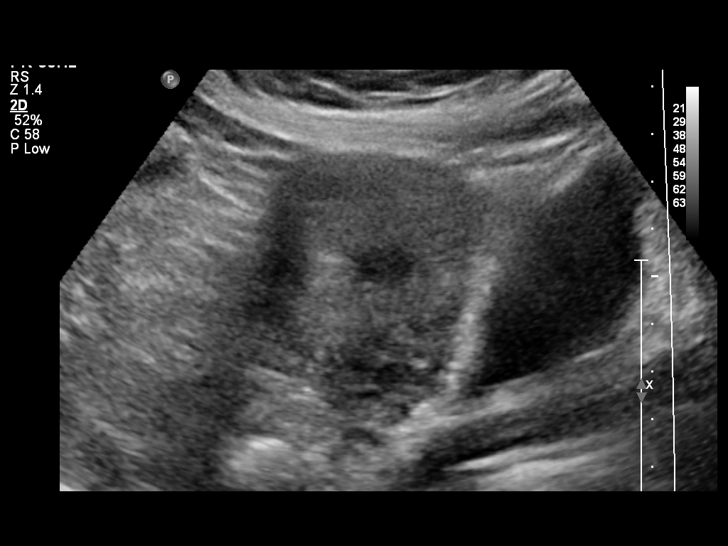
[im 7/57]
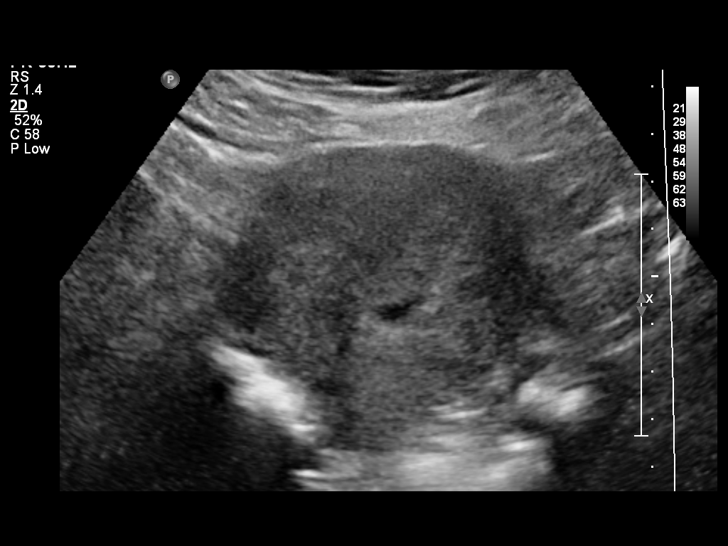
[im 11/57]
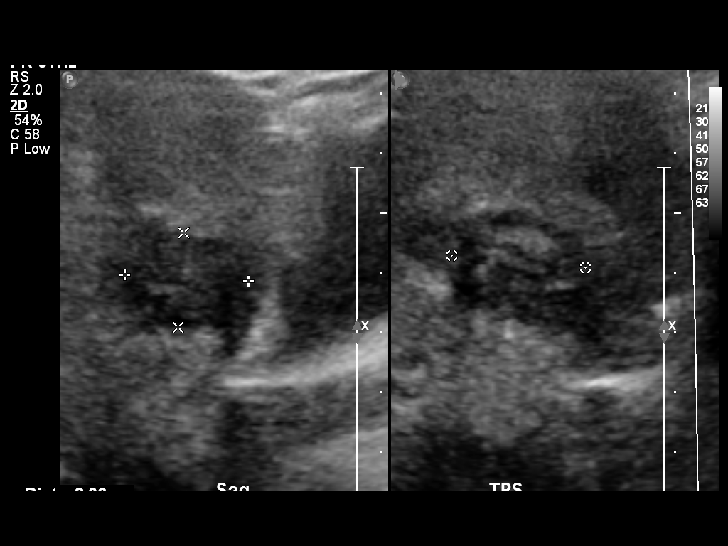
[im 15/57]
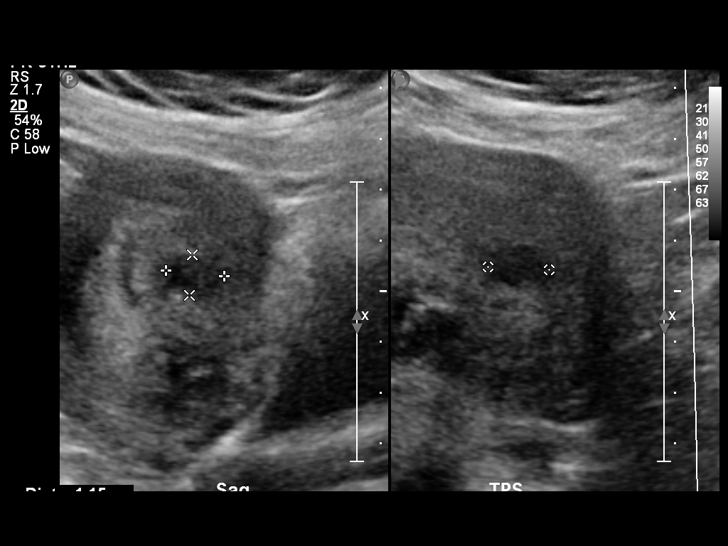
[im 19/57]
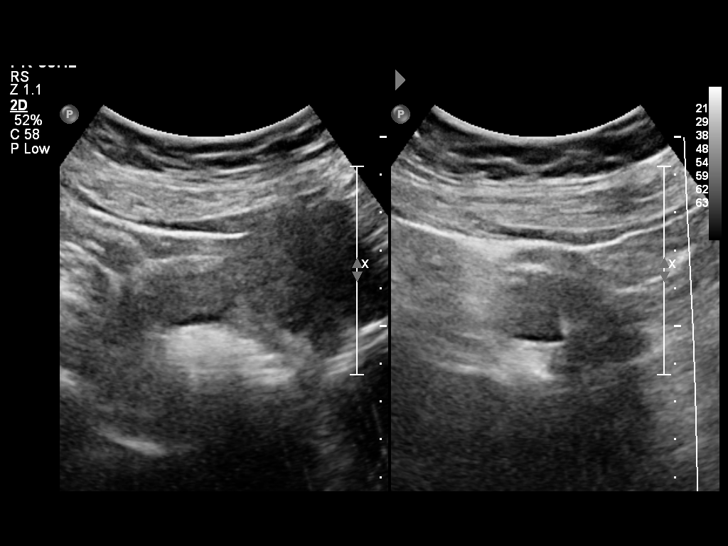
[im 23/57]
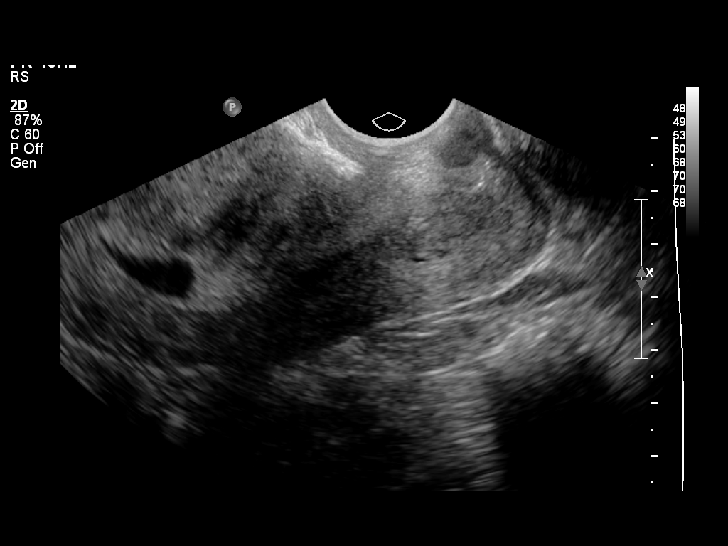
[im 30/57]
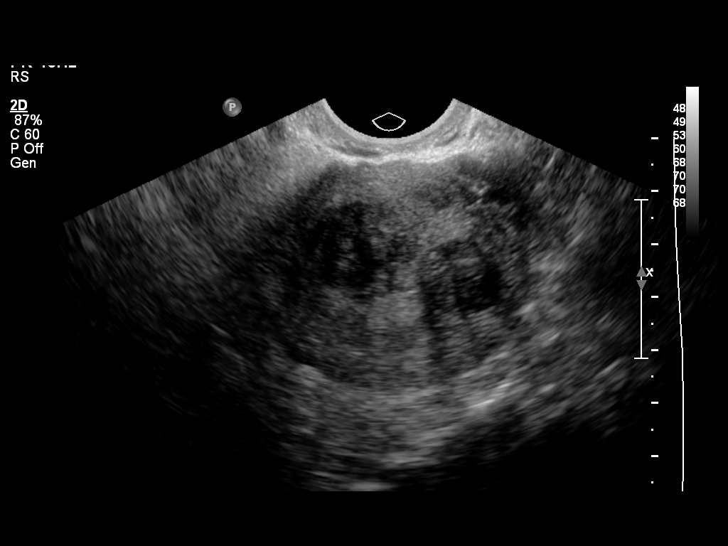
[im 34/57]
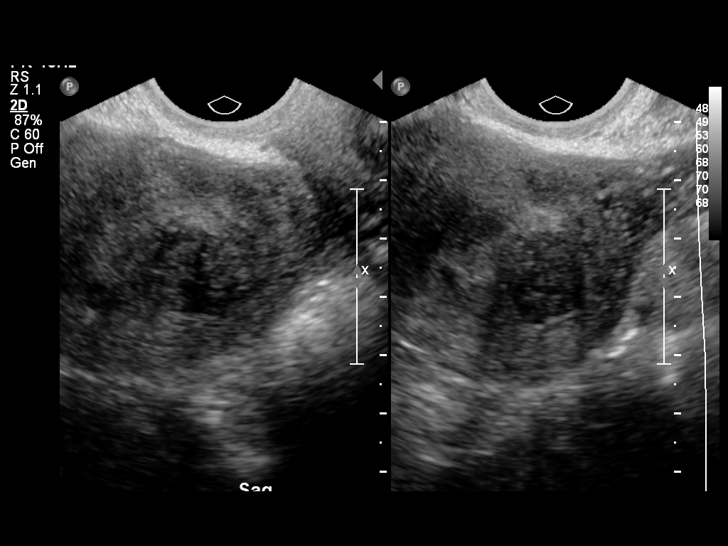
[im 38/57]
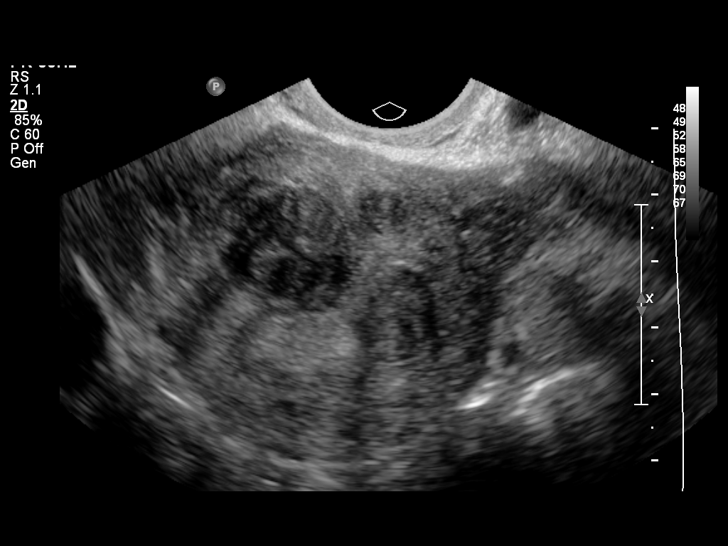
[im 42/57]
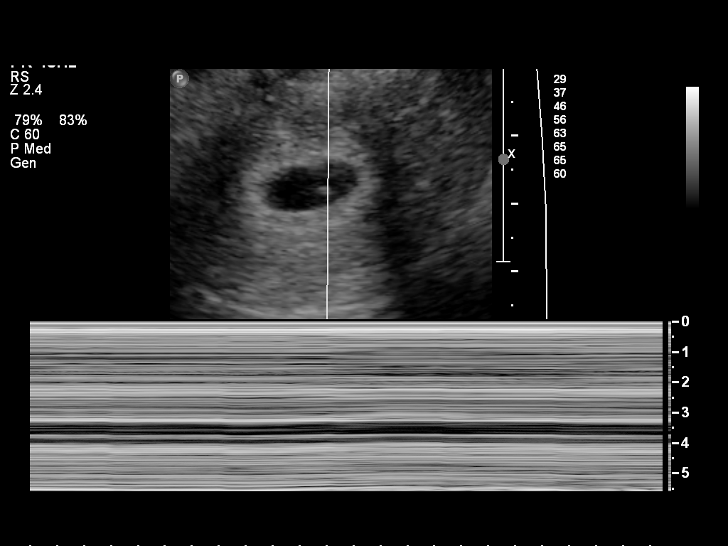
[im 46/57]
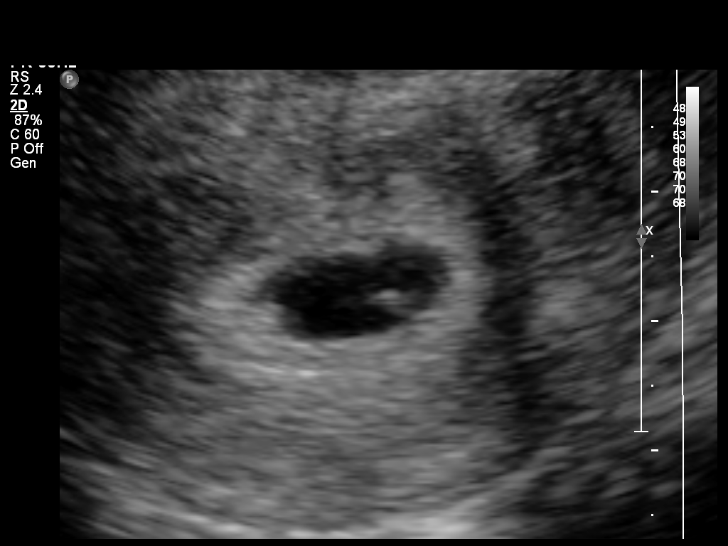
[im 50/57]
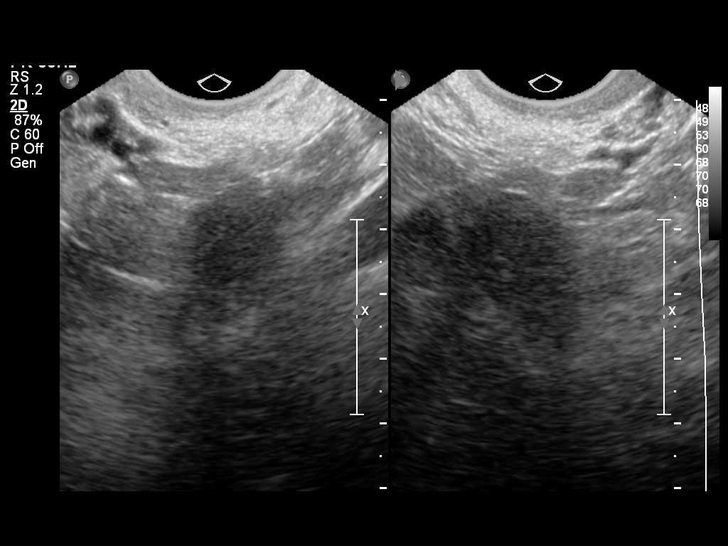
[im 54/57]
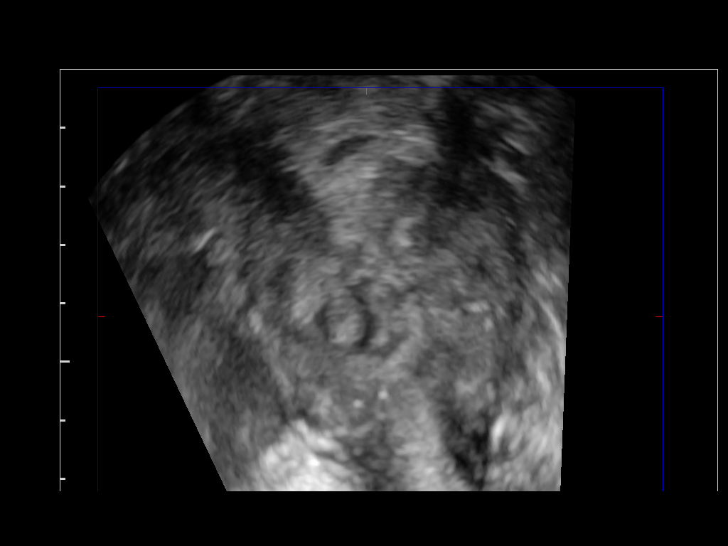

[13 of 28 positions shown; findings below may reference images not displayed]

FINDINGS: Intrauterine gestational sac: Visualized/normal in shape.

Yolk sac:  Visualized

Embryo:  Visualized

Cardiac Activity: Equivocal with suggestion of fetal heart flicker
at real-time but untraceable M-Mode.

CRL:   2.2  mm   5 w 6 d                  US EDC: 11/23/2014

Maternal uterus/adnexae: There is no evidence of subchorionic
hemorrhage.

Ovaries bilaterally are unremarkable.

Multiple uterine fibroids are identified with the largest as
follows:

1.1 x 1.1 x 2.1 cm anterior right lower uterine segment fibroid
adjacent to the gestational sac.

1 x 1.1 x 1.1 cm left anterior fundal fibroid adjacent to the
gestational sac.

1.6 x 1.5 x 1.4 cm posterior left intramural uterine body fibroid.

No free fluid or adnexal mass identified.
IMPRESSION: Single intrauterine gestation with equivocal cardiac activity as
described. Estimated gestational age and gestational age by this
ultrasound are 5 weeks 6 days.

Uterine fibroids as described, two which lie adjacent to the
intrauterine gestational sac.

No evidence of subchorionic hemorrhage.

## 2016-02-11 DIAGNOSIS — Z01419 Encounter for gynecological examination (general) (routine) without abnormal findings: Secondary | ICD-10-CM | POA: Diagnosis not present

## 2016-02-11 DIAGNOSIS — L900 Lichen sclerosus et atrophicus: Secondary | ICD-10-CM | POA: Diagnosis not present

## 2016-02-11 DIAGNOSIS — Z6831 Body mass index (BMI) 31.0-31.9, adult: Secondary | ICD-10-CM | POA: Diagnosis not present

## 2016-02-23 ENCOUNTER — Ambulatory Visit: Payer: No Typology Code available for payment source | Admitting: Internal Medicine

## 2016-03-17 ENCOUNTER — Encounter: Payer: Self-pay | Admitting: Internal Medicine

## 2016-03-17 ENCOUNTER — Ambulatory Visit (INDEPENDENT_AMBULATORY_CARE_PROVIDER_SITE_OTHER): Payer: BLUE CROSS/BLUE SHIELD | Admitting: Internal Medicine

## 2016-03-17 VITALS — BP 126/80 | HR 57 | Temp 98.2°F | Ht 67.5 in | Wt 205.8 lb

## 2016-03-17 DIAGNOSIS — E890 Postprocedural hypothyroidism: Secondary | ICD-10-CM

## 2016-03-17 DIAGNOSIS — C73 Malignant neoplasm of thyroid gland: Secondary | ICD-10-CM

## 2016-03-17 LAB — TSH: TSH: 0.9 u[IU]/mL (ref 0.35–4.50)

## 2016-03-17 LAB — T4, FREE: Free T4: 1.04 ng/dL (ref 0.60–1.60)

## 2016-03-17 NOTE — Progress Notes (Signed)
Pre visit review using our clinic tool,if applicable. No additional management support is needed unless otherwise documented below in the visit note.  

## 2016-03-17 NOTE — Progress Notes (Signed)
Patient ID: Pamela [name], female   DOB: 12-31-1970, 45 y.o.   MRN: [id number]   HPI  Pamela [name] is a 45 y.o.-year-old female, initially referred by Dr. Dellis [name], returning for follow-up for h/o papillary thyroid cancer, now with postsurgical hypothyroidism and also postsurgical hypocalcemia and hypomagnesemia. Last visit 6 months ago.  PTC history: She had an exam with ObGyn on 12/15/2014 >> Dr. Dellis [name] felt a lump in neck.  Thyroid U/S (02/23/2015): Hypoechoic, solid right-sided isthmus nodule measures approximately 1.4 x 0.6 x 1.0 cm. There are a few scattered areas of peripheral calcification in this nodule.    FNA (02/25/2015): FINDINGS CONSISTENT WITH PAPILLARY CARCINOMA (Pamela [name] CATEGORY VI).  Thyroidectomy (03/12/2015): 1. Thyroid, thyroidectomy, total thyroid sutures right lobe - MULTIFOCAL PAPILLARY THYROID CARCINOMA, TWO FOCI MEASURING 1.1 CM AND 0.6 CM IN GREATEST DIMENSION. - MARGINS ARE NEGATIVE. - ONE BENIGN LYMPH NODE WITH NO TUMOR SEEN (0/1). - SEE ONCOLOGY TEMPLATE. ADDITIONAL FINDINGS: - INCIDENTAL BENIGN PARATHYROID TISSUE (0.3 CM). 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 2. Lymph nodes, regional resection, central compartment - THREE BENIGN LYMPH NODES WITH NO TUMOR SEEN (0/3). - INCIDENTAL BENIGN THYMIC TISSUE.  1. THYROID Specimen: Total thyroid with central compartment lymph nodes. Procedure: Total thyroidectomy with central compartment lymph node dissection. Specimen Integrity (intact/fragmented): Intact. Tumor focality: Multifocal, two foci.  Dominant tumor: Maximum tumor size (cm): 1.1 cm Tumor laterality: Isthmus (central). Histologic type (including subtype and/or unique features as applicable): Papillary thyroid carcinoma, conventional type. Tumor capsule: No tumor capsule identified. Extrathyroidal extension: No. Margins: Negative. Lymph - Vascular invasion: Not identified. Capsular invasion with degree of invasion if present: Not  applicable.  Second tumor: Tumor size(s): 0.6 cm Tumor laterality: Right superior. Histologic type (including subtype and/or unique features as applicable): Papillary thyroid carcinoma, conventional type. Tumor capsule: No tumor capsule identified. Extrathyroidal extension: No. Margins: Negative. Lymph - Vascular invasion: Not identified. Capsular invasion with degree of invasion if present: Not applicable. Lymph nodes: # examined 4; # positive; 0.  Component     Latest Ref Rng 09/18/2015  Thyroglobulin     2.8 - 40.9 ng/mL 0.2 (L)  Thyroglobulin Ab     <2 IU/mL <1   I reviewed pt's thyroid tests - at goal: Lab Results  Component Value Date   TSH 1.60 09/18/2015   TSH 0.48 07/30/2015   TSH 1.87 05/28/2015   TSH 3.67 04/17/2015   TSH 1.54 03/19/2015   FREET4 0.91 09/18/2015   FREET4 1.24 07/30/2015   FREET4 0.86 05/28/2015   FREET4 0.94 04/17/2015   FREET4 0.91 03/19/2015   She is taking Synthroid DAW 125 mcg: - daily - in am - with water - separated by b'fast in am by 30 min - MVI at night - off calcium - no iron  Pt describes: - + weight loss - + fatigue - + heat and cold intolerance - no depression - no diarrhea/constipation - no dry skin - no hair loss  She had low calcium after the surgery >> resolved.  She  also has a history of HTN during pregnancy >> son born 10/2014.   She was dx'ed with lichen sclerosus >> on Clobetasol.  ROS: Constitutional: + see HPI Eyes: no blurry vision, no xerophthalmia ENT: no sore throat, no nodules palpated in throat, no dysphagia/odynophagia, no hoarseness Cardiovascular: no CP/SOB/palpitations/leg swelling Respiratory: no cough/no SOB Gastrointestinal: no N/V/D/C Musculoskeletal: no muscle/ joint aches Skin: no rashes Neurological: no tremors/numbness/tingling/dizziness, + HA  I reviewed pt's medications, allergies, PMH, social hx,  family hx, and changes were documented in the history of present illness.  Otherwise, unchanged from my initial visit note.  Past Medical History  Diagnosis Date  . Genital warts     removed  . Termination of pregnancy (fetus)     x 1  . Newborn product of in vitro fertilization (IVF) pregnancy   . Dysrhythmia     resolved- "grew out of it" - no problem since teenager  . Hypertension 2016    [place] with 2016 pregnancy / no BP meds since birth of child  . Dermatitis   . Thyroid cancer [facility] [place])    Past Surgical History  Procedure Laterality Date  . Bladder surgery  1992    Widened the urethra  . Tubal ligation    . Myomectomy  2008  . Myomectomy  2014    Removed tubes bilaterally  . Wisdom tooth extraction    . Cesarean section N/A 11/04/2014    Procedure: CESAREAN SECTION;  Surgeon: Pamela Bruins, MD;  Location: [facility];  Service: Obstetrics;  Laterality: N/A;  EDD: 11/23/14  . Myomectomy N/A 11/04/2014    Procedure: MYOMECTOMY;  Surgeon: Pamela Bruins, MD;  Location: [facility];  Service: Obstetrics;  Laterality: N/A;  . Thyroidectomy N/A 03/12/2015    Procedure: TOTAL THYROIDECTOMY;  Surgeon: Pamela Gemma, MD;  Location: [facility];  Service: General;  Laterality: N/A;  . Lymph node dissection  03/12/2015    Procedure: LYMPH NODE DISSECTION;  Surgeon: Pamela Gemma, MD;  Location: [facility];  Service: General;;   History   Social History  . Marital Status: Married    Spouse Name: N/A  . Number of Children: 1   Occupational History  .  accountant    Social History Main Topics  . Smoking status: Former Smoker -- 1.00 packs/day for 20 years    Types: Cigarettes    Quit date: 08/11/2007  . Smokeless tobacco: Never Used  . Alcohol Use: Yes     Comment: Beer/liquor 1 drink once a week   . Drug Use: Yes    Special: Marijuana, Cocaine     Comment: 25 years ago "Acid"  , last use cocaine/marijuana 25 yrs ago   Current Outpatient Prescriptions on File Prior to Visit  Medication Sig Dispense Refill  . calcium carbonate (OS-CAL - DOSED IN MG OF ELEMENTAL CALCIUM)  1250 (500 CA) MG tablet Take 2 tablets (1,000 mg of elemental calcium total) by mouth 3 (three) times daily with meals. 60 tablet 2  . SYNTHROID 125 MCG tablet Take 1 tablet (125 mcg total) by mouth daily before breakfast. 30 tablet 2  . tacrolimus (PROTOPIC) 0.1 % ointment Apply 1 application topically daily as needed (dermatitis).     No current facility-administered medications on file prior to visit.   Allergies  Allergen Reactions  . Augmentin [Amoxicillin-Pot Clavulanate] Nausea And Vomiting  . Erythromycin Nausea And Vomiting   No family history available. Patient is adopted.  PE: BP 126/80 mmHg  Pulse 57  Temp(Src) 98.2 F (36.8 C) (Oral)  Ht 5' 7.5" (1.715 m)  Wt 205 lb 12.8 oz (93.35 kg)  BMI 31.74 kg/m2  SpO2 97%  LMP 03/04/2016  Breastfeeding? Yes Body mass index is 31.74 kg/(m^2). Wt Readings from Last 3 Encounters:  03/17/16 205 lb 12.8 oz (93.35 kg)  09/18/15 212 lb (96.163 kg)  03/29/15 206 lb (93.441 kg)   Constitutional: overweight, in NAD Eyes: PERRLA, EOMI, no exophthalmos ENT: moist mucous membranes, thyroidectomy scar healed, no masses palpated  in neck Cardiovascular: RRR, No MRG Respiratory: CTA B Gastrointestinal: abdomen soft, NT, ND, BS+ Musculoskeletal: no deformities, strength intact in all 4 Skin: moist, warm, no rashes Neurological: no tremor with outstretched hands, DTR normal in all 4  ASSESSMENT: 1. Bifocal papillary thyroid cancer  2. Postsurgical hypothyroidism  PLAN: 1. Papillary thyroid cancer - no cervical  scar swelling, no pain or erythema - reviewed papillary thyroid cancer features:  reviewed the pathology  multifocal, but the second focus is micro-PTC  not encapsulated  did not spread to the lymph vessels  Margins are free of disease, as are lymph vessels  prognosis: Very good, she is stage I  treatment with RAI is not necessary for such a small cancer, despite bifocality  TSH goals (low normal range)  - we  discussed about follow-up, which would be by ultrasound, with the first ultrasound approximately a year after her surgery (03/2016) >> will order now, but will also repeat Tg + ATA today - Return in about 1 year (around 03/17/2017).  2. Postsurgical hypothyroidism - Patient is on LT4 125 g daily - I advised her to take the thyroid hormone every day, with water, >30 minutes before breakfast, separated by >4 hours from acid reflux medications, calcium, iron, multivitamins. - We'll check TSH, free T4 today  Orders Placed This Encounter  Procedures  . US Soft Tissue Head/Neck  . T4, free  . TSH  . Thyroglobulin Level  . Thyroglobulin antibody    Office Visit on 03/17/2016  Component Date Value Ref Range Status  . Free T4 03/17/2016 1.04  0.60 - 1.60 ng/dL Final  . TSH 03/17/2016 0.90  0.35 - 4.50 uIU/mL Final  . Thyroglobulin 03/17/2016 0.1* 2.8 - 40.9 ng/mL Final   Comment: Thyroglobulin antibodies (TGAb) interfere with Thyroglobulin (TG) assays; therefore, Thyroglobulin antibody (TGAb) assay should always be performed in conjunction with a Thyroglobulin (TG) assay.   This test was performed using the Beckman Coulter chemiluminescent method.  Values obtained from different assay methods cannot be used interchangeably.  Thyroglobulin levels, regardless of value, should not be interpreted as absolute evidence of the presence or absence of disease.   . Thyroglobulin Ab 03/17/2016 <1  <2 IU/mL Final   Normal TFTs.Thyroglobulin is even better than before and her thyroglobulin antibodies are negative!  Thyroid ultrasound pending.

## 2016-03-17 NOTE — Patient Instructions (Signed)
Please stop at the lab.  Continue Synthroid 125 mcg daily.  Take the thyroid hormone every day, with water, at least 30 minutes before breakfast, separated by at least 4 hours from: - acid reflux medications - calcium - iron - multivitamins  We will schedule a neck U/S for you.  Please return in 1 year.

## 2016-03-18 LAB — THYROGLOBULIN ANTIBODY: Thyroglobulin Ab: <1 IU/mL (ref <2)

## 2016-03-18 LAB — THYROGLOBULIN LEVEL: Thyroglobulin: 0.1 ng/mL — ABNORMAL LOW (ref 2.8–40.9)

## 2016-03-28 ENCOUNTER — Other Ambulatory Visit: Payer: BLUE CROSS/BLUE SHIELD

## 2016-04-01 ENCOUNTER — Other Ambulatory Visit: Payer: Self-pay | Admitting: Internal Medicine

## 2016-04-07 DIAGNOSIS — Z139 Encounter for screening, unspecified: Secondary | ICD-10-CM | POA: Diagnosis not present

## 2016-04-07 DIAGNOSIS — Z87898 Personal history of other specified conditions: Secondary | ICD-10-CM | POA: Diagnosis not present

## 2016-04-07 DIAGNOSIS — L930 Discoid lupus erythematosus: Secondary | ICD-10-CM | POA: Diagnosis not present

## 2016-04-07 DIAGNOSIS — M2550 Pain in unspecified joint: Secondary | ICD-10-CM | POA: Diagnosis not present

## 2016-04-11 ENCOUNTER — Ambulatory Visit
Admission: RE | Admit: 2016-04-11 | Discharge: 2016-04-11 | Disposition: A | Discharge status: Home / Self Care | Payer: BLUE CROSS/BLUE SHIELD | Source: Ambulatory Visit | Attending: Internal Medicine | Admitting: Internal Medicine

## 2016-04-11 DIAGNOSIS — E890 Postprocedural hypothyroidism: Secondary | ICD-10-CM | POA: Diagnosis not present

## 2016-06-28 DIAGNOSIS — L292 Pruritus vulvae: Secondary | ICD-10-CM | POA: Diagnosis not present

## 2016-06-28 DIAGNOSIS — R87610 Atypical squamous cells of undetermined significance on cytologic smear of cervix (ASC-US): Secondary | ICD-10-CM | POA: Diagnosis not present

## 2016-06-28 DIAGNOSIS — L900 Lichen sclerosus et atrophicus: Secondary | ICD-10-CM | POA: Diagnosis not present

## 2016-06-28 DIAGNOSIS — Z124 Encounter for screening for malignant neoplasm of cervix: Secondary | ICD-10-CM | POA: Diagnosis not present

## 2016-06-28 DIAGNOSIS — Z1151 Encounter for screening for human papillomavirus (HPV): Secondary | ICD-10-CM | POA: Diagnosis not present

## 2016-07-12 DIAGNOSIS — H524 Presbyopia: Secondary | ICD-10-CM | POA: Diagnosis not present

## 2016-07-29 DIAGNOSIS — M150 Primary generalized (osteo)arthritis: Secondary | ICD-10-CM | POA: Diagnosis not present

## 2016-07-29 DIAGNOSIS — Z23 Encounter for immunization: Secondary | ICD-10-CM | POA: Diagnosis not present

## 2016-07-29 DIAGNOSIS — L720 Epidermal cyst: Secondary | ICD-10-CM | POA: Diagnosis not present

## 2016-07-29 DIAGNOSIS — M151 Heberden's nodes (with arthropathy): Secondary | ICD-10-CM | POA: Diagnosis not present

## 2016-07-29 DIAGNOSIS — R635 Abnormal weight gain: Secondary | ICD-10-CM | POA: Diagnosis not present

## 2016-08-02 ENCOUNTER — Other Ambulatory Visit: Payer: Self-pay | Admitting: Internal Medicine

## 2016-08-02 DIAGNOSIS — Z1231 Encounter for screening mammogram for malignant neoplasm of breast: Secondary | ICD-10-CM | POA: Diagnosis not present

## 2016-08-02 DIAGNOSIS — M150 Primary generalized (osteo)arthritis: Secondary | ICD-10-CM | POA: Diagnosis not present

## 2016-08-02 DIAGNOSIS — M151 Heberden's nodes (with arthropathy): Secondary | ICD-10-CM | POA: Diagnosis not present

## 2016-08-02 DIAGNOSIS — M79641 Pain in right hand: Secondary | ICD-10-CM | POA: Diagnosis not present

## 2016-08-16 DIAGNOSIS — Z6830 Body mass index (BMI) 30.0-30.9, adult: Secondary | ICD-10-CM | POA: Diagnosis not present

## 2016-08-16 DIAGNOSIS — L720 Epidermal cyst: Secondary | ICD-10-CM | POA: Diagnosis not present

## 2016-08-16 DIAGNOSIS — M19041 Primary osteoarthritis, right hand: Secondary | ICD-10-CM | POA: Diagnosis not present

## 2016-08-16 DIAGNOSIS — M19042 Primary osteoarthritis, left hand: Secondary | ICD-10-CM | POA: Diagnosis not present

## 2016-08-27 ENCOUNTER — Other Ambulatory Visit: Payer: Self-pay | Admitting: Internal Medicine

## 2016-09-23 ENCOUNTER — Encounter: Payer: Self-pay | Admitting: Internal Medicine

## 2016-09-23 ENCOUNTER — Other Ambulatory Visit: Payer: Self-pay | Admitting: Internal Medicine

## 2016-09-27 ENCOUNTER — Other Ambulatory Visit: Payer: Self-pay

## 2016-09-27 DIAGNOSIS — J209 Acute bronchitis, unspecified: Secondary | ICD-10-CM | POA: Diagnosis not present

## 2016-09-27 DIAGNOSIS — J0140 Acute pansinusitis, unspecified: Secondary | ICD-10-CM | POA: Diagnosis not present

## 2016-09-27 DIAGNOSIS — Z6830 Body mass index (BMI) 30.0-30.9, adult: Secondary | ICD-10-CM | POA: Diagnosis not present

## 2016-09-27 MED ORDER — SYNTHROID 125 MCG PO TABS: ORAL_TABLET | ORAL | 5 refills | Status: DC

## 2016-12-05 DIAGNOSIS — Z3141 Encounter for fertility testing: Secondary | ICD-10-CM | POA: Diagnosis not present

## 2016-12-05 DIAGNOSIS — N8500 Endometrial hyperplasia, unspecified: Secondary | ICD-10-CM | POA: Diagnosis not present

## 2016-12-05 DIAGNOSIS — D251 Intramural leiomyoma of uterus: Secondary | ICD-10-CM | POA: Diagnosis not present

## 2016-12-23 IMAGING — US US SOFT TISSUE HEAD/NECK
1 series · 13 of 25 positions shown · non-contrast
Comparison: None.

CLINICAL DATA: Palpable thyroid nodule to the right of midline.

EXAM:
THYROID ULTRASOUND
TECHNIQUE: Ultrasound examination of the thyroid gland and adjacent soft
tissues was performed.

[Series 1: us soft tissue head/neck · 0.05mm/px · 13 of 54 slices shown]
[im 1/54]
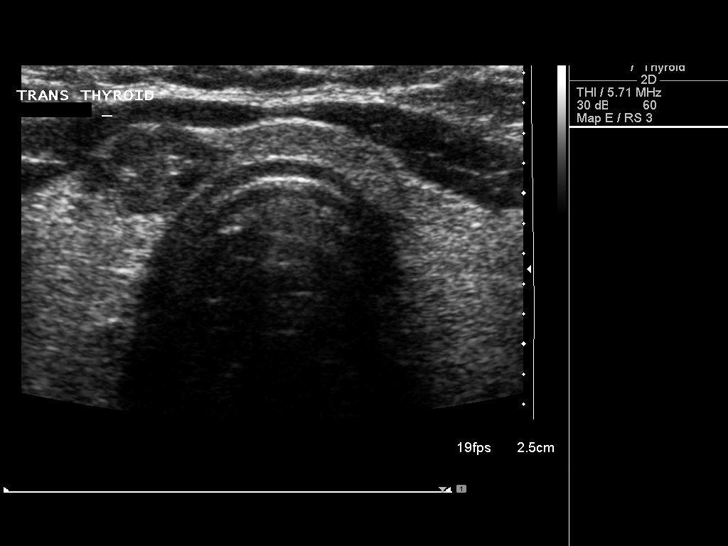
[im 5/54]
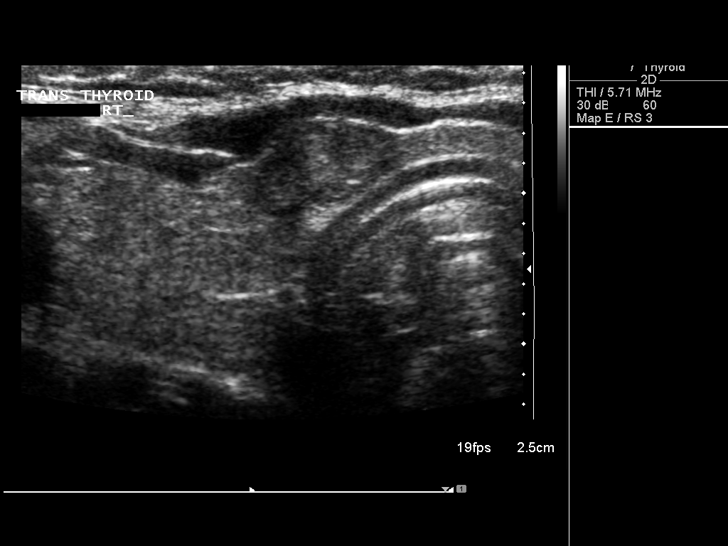
[im 9/54]
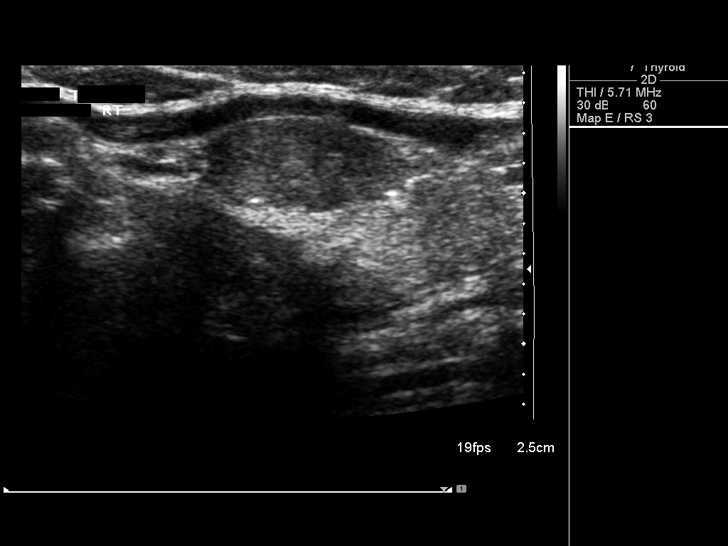
[im 14/54]
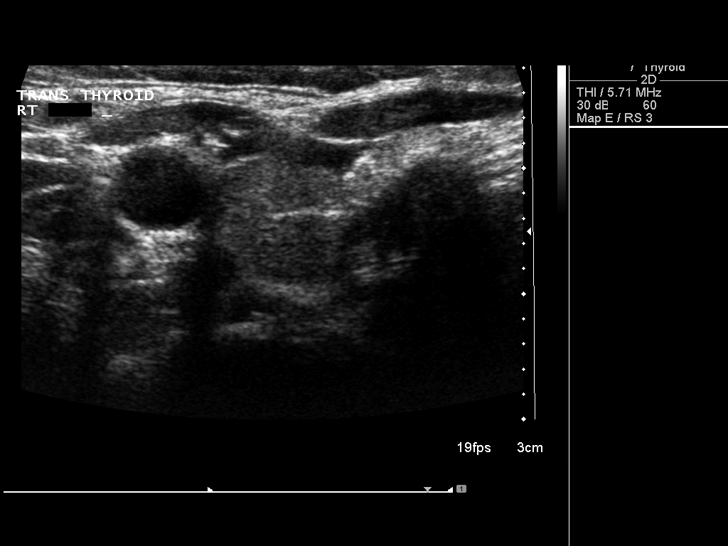
[im 18/54]
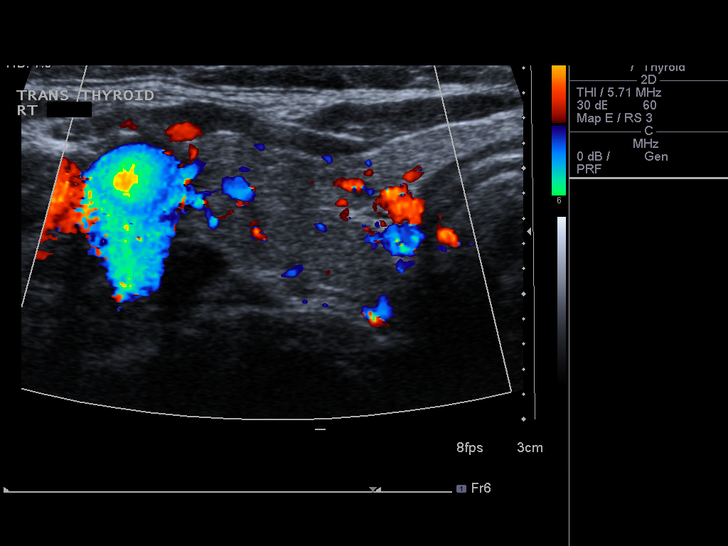
[im 23/54]
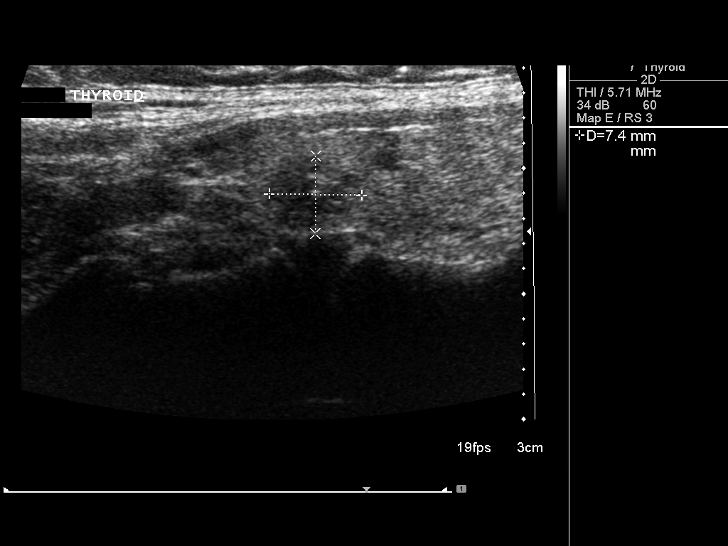
[im 27/54]
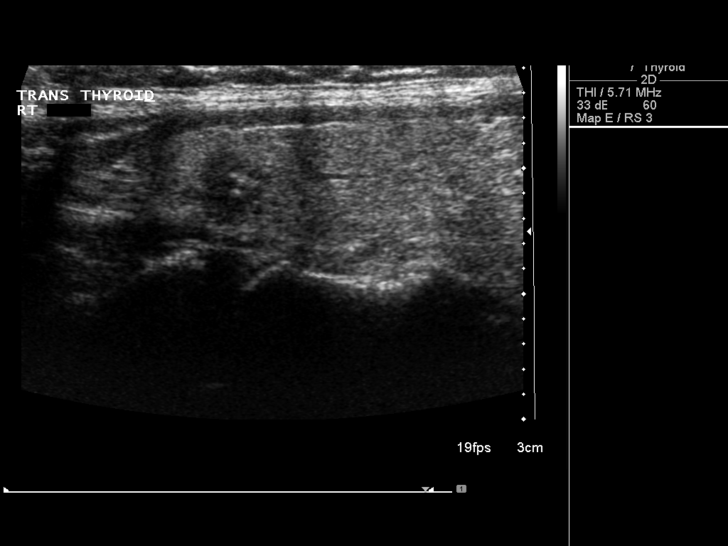
[im 31/54]
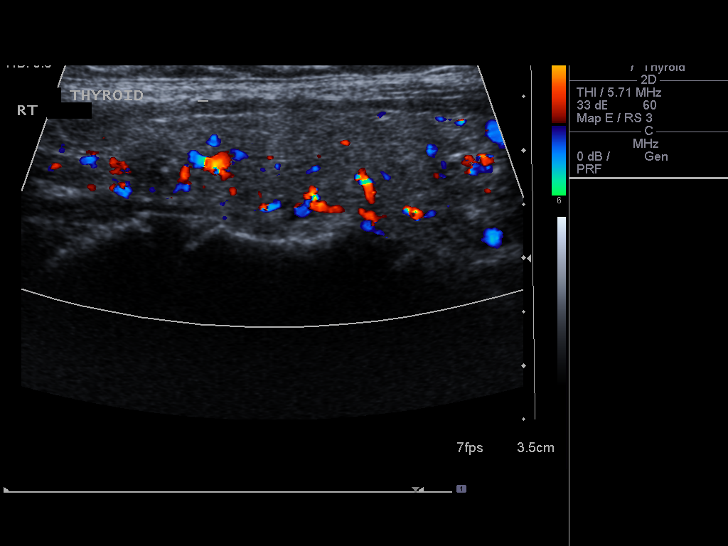
[im 36/54]
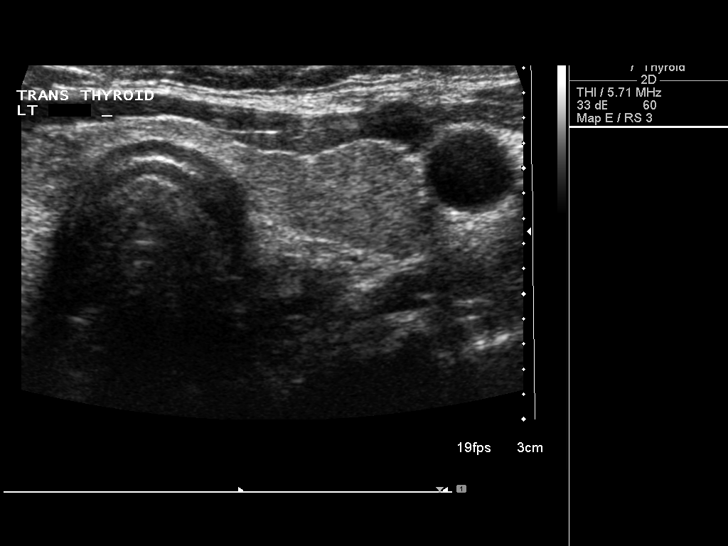
[im 40/54]
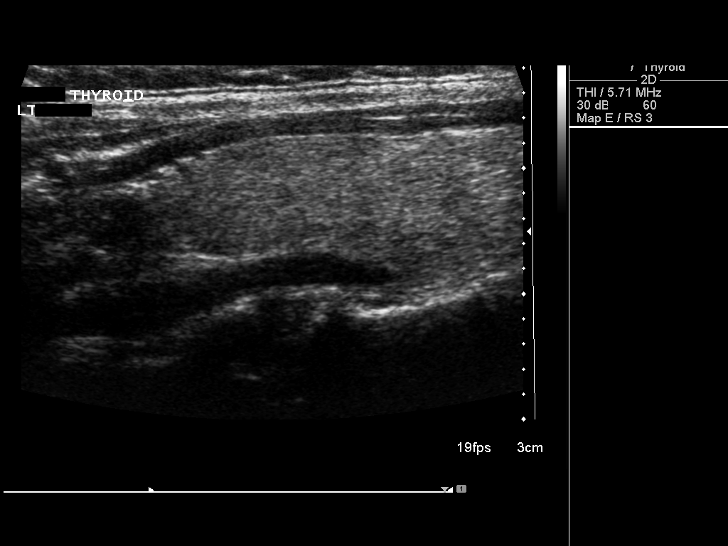
[im 45/54]
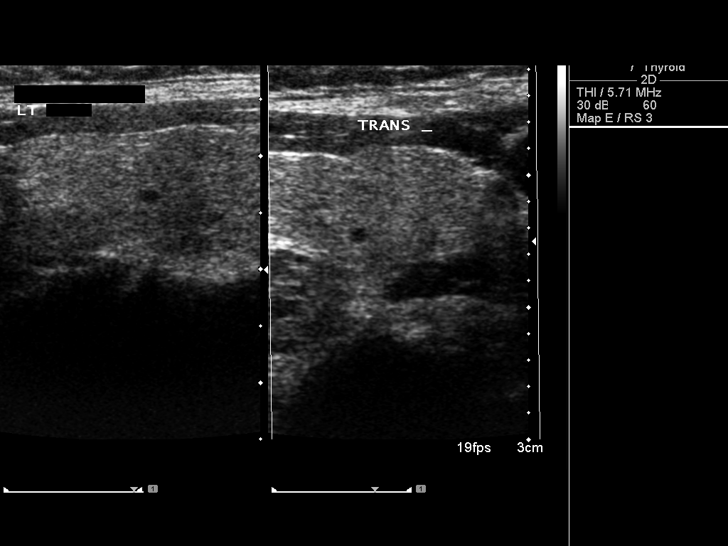
[im 49/54]
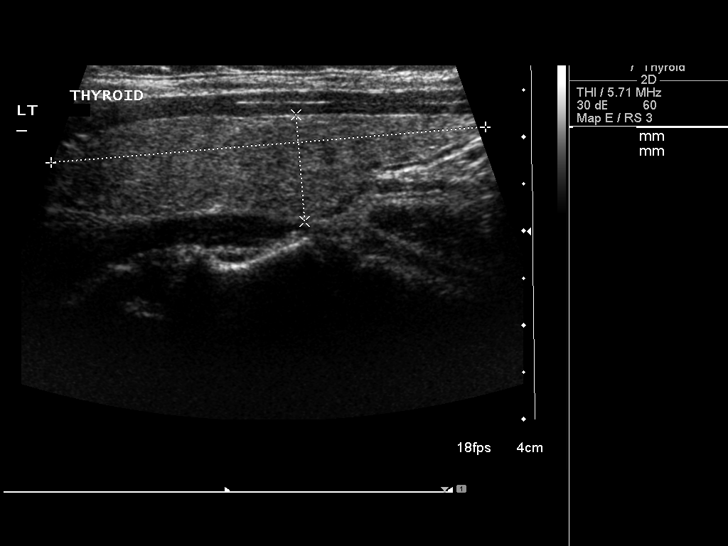
[im 54/54]
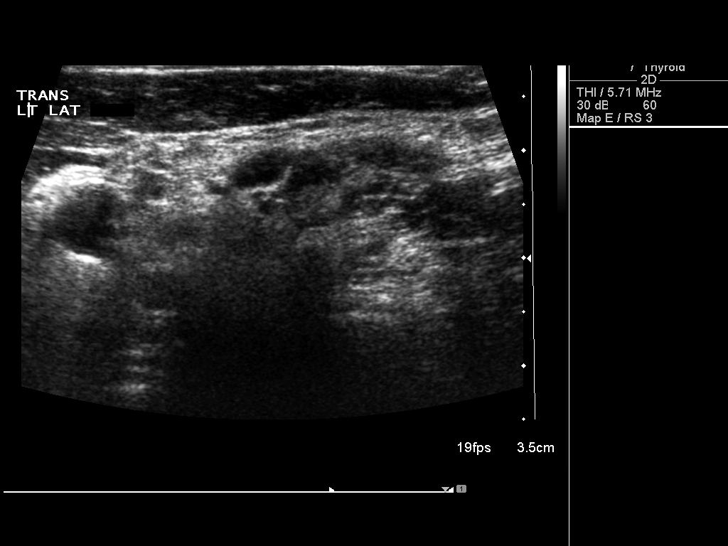

[13 of 25 positions shown; findings below may reference images not displayed]

FINDINGS: Right thyroid lobe

Measurements: 5.3 x 1.4 x 1.9 cm. Small solid area of nodularity in
the superior right lobe measures approximately 0.7 x 0.6 x 0.5 cm.
This small nodule may contain some subtle areas of calcification
internally.

Left thyroid lobe

Measurements: 4.6 x 1.1 x 1.7 cm. Tiny benign cyst in the mid left
lobe measures less than 2 mm in diameter.

Isthmus

Thickness: 0.2 cm in the midline. Hypoechoic, solid right-sided
isthmus nodule measures approximately 1.4 x 0.6 x 1.0 cm. There are
a few scattered areas of peripheral calcification in this nodule.

Lymphadenopathy

None visualized.
IMPRESSION: The palpable abnormality corresponds to a solid right-sided isthmus
nodule measuring approximately 1.4 x 0.6 x 1.0 cm. Given suggestion
of some areas of internal microcalcification by ultrasound, biopsy
of this nodule should be considered. A small subcentimeter right
thyroid nodule does not meet size criteria for biopsy.

Findings meet consensus criteria for biopsy. Ultrasound-guided fine
needle aspiration of the right isthmus nodule should be considered,
as per the consensus statement: Management of Thyroid Nodules
Detected at US: Society of Radiologists in Ultrasound Consensus

## 2017-02-14 ENCOUNTER — Ambulatory Visit (INDEPENDENT_AMBULATORY_CARE_PROVIDER_SITE_OTHER): Payer: BLUE CROSS/BLUE SHIELD | Admitting: Internal Medicine

## 2017-02-14 ENCOUNTER — Encounter: Payer: Self-pay | Admitting: Internal Medicine

## 2017-02-14 VITALS — BP 124/84 | HR 63 | Wt 208.0 lb

## 2017-02-14 DIAGNOSIS — C73 Malignant neoplasm of thyroid gland: Secondary | ICD-10-CM | POA: Diagnosis not present

## 2017-02-14 DIAGNOSIS — E890 Postprocedural hypothyroidism: Secondary | ICD-10-CM | POA: Diagnosis not present

## 2017-02-14 LAB — T4, FREE: Free T4: 1.07 ng/dL (ref 0.60–1.60)

## 2017-02-14 LAB — TSH: TSH: 1.06 u[IU]/mL (ref 0.35–4.50)

## 2017-02-14 NOTE — Patient Instructions (Addendum)
Please stop at the lab.   Continue Synthroid 125 mcg daily.  We may need to increase Synthroid by 2 tablets a week, after the IVF.  Take the thyroid hormone every day, with water, at least 30 minutes before breakfast, separated by at least 4 hours from: - acid reflux medications - calcium - iron - multivitamins  Please return in 1 year.

## 2017-02-14 NOTE — Progress Notes (Addendum)
Patient ID: Pamela [name], female   DOB: [birth year], 46 y.o.   MRN: [id number]   HPI  Pamela [name] is a 46 y.o.-year-old female, initially referred by Dr. Dellis Filbert, returning for follow-up for h/o papillary thyroid cancer, now with postsurgical hypothyroidism and also postsurgical hypocalcemia and hypomagnesemia. Last visit 11 months ago.  She is preparing for an IVF round >> started folic acid.   Reviewed and addended PTC history: She had an exam with ObGyn on 12/15/2014 >> Dr. Dellis Filbert felt a lump in neck.  Thyroid U/S (02/23/2015): Hypoechoic, solid right-sided isthmus nodule measures approximately 1.4 x 0.6 x 1.0 cm. There are a few scattered areas of peripheral calcification in this nodule.    FNA (02/25/2015): FINDINGS CONSISTENT WITH PAPILLARY CARCINOMA ([place]ES[place] CATEGORY VI).  Thyroidectomy (03/12/2015): 1. Thyroid, thyroidectomy, total thyroid sutures right lobe - MULTIFOCAL PAPILLARY THYROID CARCINOMA, TWO FOCI MEASURING 1.1 CM AND 0.6 CM IN GREATEST DIMENSION. - MARGINS ARE NEGATIVE. - ONE BENIGN LYMPH NODE WITH NO TUMOR SEEN (0/1). - SEE ONCOLOGY TEMPLATE. ADDITIONAL FINDINGS: - INCIDENTAL BENIGN PARATHYROID TISSUE (0.3 CM). 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 2. Lymph nodes, regional resection, central compartment - THREE BENIGN LYMPH NODES WITH NO TUMOR SEEN (0/3). - INCIDENTAL BENIGN THYMIC TISSUE.  1. THYROID Specimen: Total thyroid with central compartment lymph nodes. Procedure: Total thyroidectomy with central compartment lymph node dissection. Specimen Integrity (intact/fragmented): Intact. Tumor focality: Multifocal, two foci.  Dominant tumor: Maximum tumor size (cm): 1.1 cm Tumor laterality: Isthmus (central). Histologic type (including subtype and/or unique features as applicable): Papillary thyroid carcinoma, conventional type. Tumor capsule: No tumor capsule identified. Extrathyroidal extension: No. Margins: Negative. Lymph - Vascular invasion:  Not identified. Capsular invasion with degree of invasion if present: Not applicable.  Second tumor: Tumor size(s): 0.6 cm Tumor laterality: Right superior. Histologic type (including subtype and/or unique features as applicable): Papillary thyroid carcinoma, conventional type. Tumor capsule: No tumor capsule identified. Extrathyroidal extension: No. Margins: Negative. Lymph - Vascular invasion: Not identified. Capsular invasion with degree of invasion if present: Not applicable. Lymph nodes: # examined 4; # positive; 0.  No RAI tx.  Component     Latest Ref Rng & Units 09/18/2015 03/17/2016  Thyroglobulin     2.8 - 40.9 ng/mL 0.2 (L) 0.1 (L)  Thyroglobulin Ab     <2 IU/mL <1 <1   04/11/2016: Neck U/S: No evidence of tissue in the right or left thyroid beds to suggest recurrence or residual thyroid tissue. No evidence of abnormal adenopathy.  I reviewed pt's thyroid tests - at goal: Lab Results  Component Value Date   TSH 0.90 03/17/2016   TSH 1.60 09/18/2015   TSH 0.48 07/30/2015   TSH 1.87 05/28/2015   TSH 3.67 04/17/2015   TSH 1.54 03/19/2015   FREET4 1.04 03/17/2016   FREET4 0.91 09/18/2015   FREET4 1.24 07/30/2015   FREET4 0.86 05/28/2015   FREET4 0.94 04/17/2015   FREET4 0.91 03/19/2015   Pt is on Synthroid 125 mcg daily, taken: - in am - fasting - at least 30 min from b'fast - no Ca, Fe, PPIs - + MVI at night - not on Biotin  She had low calcium after the surgery >> resolved.  She  also has a history of HTN during pregnancy >> son born 10/2014.   She will go to [place] at the end of this month.  ROS: Constitutional: no weight gain/no weight loss, no fatigue, no subjective hyperthermia, no subjective hypothermia Eyes: no blurry vision, no xerophthalmia ENT:  no sore throat, no nodules palpated in throat, no dysphagia, no odynophagia, no hoarseness Cardiovascular: no CP/no SOB/no palpitations/no leg swelling Respiratory: no cough/no SOB/no  wheezing Gastrointestinal: no N/no V/no D/no C/no acid reflux Musculoskeletal: no muscle aches/no joint aches Skin: no rashes, no hair loss Neurological: no tremors/no numbness/no tingling/no dizziness  I reviewed pt's medications, allergies, PMH, social hx, family hx, and changes were documented in the history of present illness. Otherwise, unchanged from my initial visit note.  Past Medical History:  Diagnosis Date  . Dermatitis   . Dysrhythmia    resolved- "grew out of it" - no problem since teenager  . Genital warts    removed  . Hypertension 2016   [place] with 2016 pregnancy / no BP meds since birth of child  . Newborn product of in vitro fertilization (IVF) pregnancy   . Termination of pregnancy (fetus)    x 1  . Thyroid cancer [facility])    Past Surgical History:  Procedure Laterality Date  . BLADDER SURGERY  1992   Widened the urethra  . CESAREAN SECTION N/A 11/04/2014   Procedure: CESAREAN SECTION;  Surgeon: [name], MD;  Location: [facility];  Service: Obstetrics;  Laterality: N/A;  EDD: 11/23/14  . LYMPH NODE DISSECTION  03/12/2015   Procedure: LYMPH NODE DISSECTION;  Surgeon: Armandina Gemma, MD;  Location: [facility];  Service: General;;  . myomectomy  2008  . MYOMECTOMY  2014   Removed tubes bilaterally  . MYOMECTOMY N/A 11/04/2014   Procedure: MYOMECTOMY;  Surgeon: [name], MD;  Location: [facility];  Service: Obstetrics;  Laterality: N/A;  . THYROIDECTOMY N/A 03/12/2015   Procedure: TOTAL THYROIDECTOMY;  Surgeon: Armandina Gemma, MD;  Location: [facility];  Service: General;  Laterality: N/A;  . TUBAL LIGATION    . WISDOM TOOTH EXTRACTION     History   Social History  . Marital Status: Married    Spouse Name: N/A  . Number of Children: 1   Occupational History  .  accountant    Social History Main Topics  . Smoking status: Former Smoker -- 1.00 packs/day for 20 years    Types: Cigarettes    Quit date: 08/11/2007  . Smokeless tobacco: Never Used  . Alcohol Use: Yes      Comment: Beer/liquor 1 drink once a week   . Drug Use: Yes    Special: Marijuana, Cocaine     Comment: 25 years ago "Acid"  , last use cocaine/marijuana 25 yrs ago   Current Outpatient Prescriptions on File Prior to Visit  Medication Sig Dispense Refill  . clobetasol (TEMOVATE) 0.05 % GEL Apply topically 2 (two) times a week.    Marland Kitchen SYNTHROID 125 MCG tablet TAKE 1 TABLET(125 MCG) BY MOUTH DAILY BEFORE BREAKFAST 30 tablet 5  . tacrolimus (PROTOPIC) 0.1 % ointment Apply 1 application topically daily as needed (dermatitis).     No current facility-administered medications on file prior to visit.    Allergies  Allergen Reactions  . Augmentin [Amoxicillin-Pot Clavulanate] Nausea And Vomiting  . Erythromycin Nausea And Vomiting   No family history available. Patient is adopted.  She has lichen sclerosus >> on Clobetasol.  PE: BP 124/84 (BP Location: Left Arm, Patient Position: Sitting)   Pulse 63   Wt 208 lb (94.3 kg)   LMP 01/26/2017   SpO2 98%   BMI 32.10 kg/m  Body mass index is 32.1 kg/m. Wt Readings from Last 3 Encounters:  02/14/17 208 lb (94.3 kg)  03/17/16 205 lb  12.8 oz (93.4 kg)  09/18/15 212 lb (96.2 kg)   Constitutional: overweight, in NAD Eyes: PERRLA, EOMI, no exophthalmos ENT: moist mucous membranes, thyroidectomy scar healed, no masses palpated in neck Cardiovascular: RRR, No MRG Respiratory: CTA B Gastrointestinal: abdomen soft, NT, ND, BS+ Musculoskeletal: no deformities, strength intact in all 4 Skin: moist, warm, no rashes Neurological: no tremor with outstretched hands, DTR normal in all 4  ASSESSMENT: 1. Bifocal papillary thyroid cancer  2. Postsurgical hypothyroidism  PLAN: 1. Papillary thyroid cancer - thyroidectomy scar perfectly healed >> no discomfort - reviewed papillary thyroid cancer features: Not encapsulated Multifocal, but second focus was a micro-PTC Nod extending to lymph vessels Margins free of ds. Reviewed report of latest  U/S >> no recurrence, no metastases in neck Overall >> stage 1 PTC >> very good prognosis >> RAI was not necessary - will repeat Tg + ATA today >> discused that this hopefully remains very low or undetectable - Return in about 1 year (around 02/14/2018).  2. Postsurgical hypothyroidism - latest thyroid labs reviewed with pt >> normal  - she continues on LT4 mcg daily - pt feels good on this dose. - we discussed about taking the thyroid hormone every day, with water, >30 minutes before breakfast, separated by >4 hours from acid reflux medications, calcium, iron, multivitamins. Pt. is taking it correctly. - she plans to have IVF >> we discussed to increase Synthroid by 2 tabs a week after the IVF (ave. calculated dose >> 160 mcg daily) - will check thyroid tests today: TSH and fT4  Component     Latest Ref Rng & Units 02/14/2017  T4,Free(Direct)     0.60 - 1.60 ng/dL 1.07  TSH     0.35 - 4.50 uIU/mL 1.06  Perfect tests. Will advise pt to increase LT4 dose to 9 tabs a week after the IVF and come back for labs in 4 weeks afterwards.  Component     Latest Ref Rng & Units 02/14/2017  Thyroglobulin     ng/mL 0.2 (L)  Thyroglobulin Ab     <2 IU/mL <1   Tg is low, ATA negative.  Philemon Kingdom, MD PhD [facility]

## 2017-02-15 LAB — THYROGLOBULIN LEVEL: Thyroglobulin: 0.2 ng/mL — ABNORMAL LOW

## 2017-02-15 LAB — THYROGLOBULIN ANTIBODY: Thyroglobulin Ab: <1 IU/mL (ref <2)

## 2017-02-21 DIAGNOSIS — R05 Cough: Secondary | ICD-10-CM | POA: Diagnosis not present

## 2017-02-21 DIAGNOSIS — Z6831 Body mass index (BMI) 31.0-31.9, adult: Secondary | ICD-10-CM | POA: Diagnosis not present

## 2017-02-21 DIAGNOSIS — J329 Chronic sinusitis, unspecified: Secondary | ICD-10-CM | POA: Diagnosis not present

## 2017-03-02 ENCOUNTER — Encounter: Payer: Self-pay | Admitting: Obstetrics & Gynecology

## 2017-03-20 ENCOUNTER — Other Ambulatory Visit: Payer: Self-pay | Admitting: Internal Medicine

## 2017-03-31 ENCOUNTER — Encounter: Payer: Self-pay | Admitting: Internal Medicine

## 2017-04-05 DIAGNOSIS — Z3200 Encounter for pregnancy test, result unknown: Secondary | ICD-10-CM | POA: Diagnosis not present

## 2017-04-07 DIAGNOSIS — Z3201 Encounter for pregnancy test, result positive: Secondary | ICD-10-CM | POA: Diagnosis not present

## 2017-04-19 ENCOUNTER — Other Ambulatory Visit: Payer: Self-pay | Admitting: Internal Medicine

## 2017-04-19 DIAGNOSIS — Z3200 Encounter for pregnancy test, result unknown: Secondary | ICD-10-CM | POA: Diagnosis not present

## 2017-04-27 DIAGNOSIS — O00101 Right tubal pregnancy without intrauterine pregnancy: Secondary | ICD-10-CM | POA: Diagnosis not present

## 2017-05-04 DIAGNOSIS — O008 Other ectopic pregnancy without intrauterine pregnancy: Secondary | ICD-10-CM | POA: Diagnosis not present

## 2017-05-19 DIAGNOSIS — O008 Other ectopic pregnancy without intrauterine pregnancy: Secondary | ICD-10-CM | POA: Diagnosis not present

## 2017-05-20 ENCOUNTER — Other Ambulatory Visit: Payer: Self-pay | Admitting: Internal Medicine

## 2017-05-22 ENCOUNTER — Other Ambulatory Visit: Payer: Self-pay

## 2017-05-22 ENCOUNTER — Telehealth: Payer: Self-pay | Admitting: Internal Medicine

## 2017-05-22 MED ORDER — SYNTHROID 125 MCG PO TABS: ORAL_TABLET | ORAL | 1 refills | Status: DC

## 2017-05-22 NOTE — Telephone Encounter (Signed)
TSH and free t4

## 2017-05-22 NOTE — Telephone Encounter (Signed)
Patient called in reference to wanting lab orders sent to [facility] at [place], [place], [place] [address] (phone number [phone]). Patient has been doubling up on SYNTHROID 125 MCG tablet Saturday and Sunday. Patient stated Renne Crigler wanted labs done to see how this is working. Please call patient and advise when orders are put in. OK to leave message.

## 2017-05-22 NOTE — Telephone Encounter (Signed)
Yes, if she is pregnant.

## 2017-05-22 NOTE — Telephone Encounter (Signed)
MEDICATION: SYNTHROID 125 MCG tablet  PHARMACY:  [facility] [phone] - [place], [place] - [address] AT [address] [phone] (Phone) [phone] (Fax)       IS THIS A 90 DAY SUPPLY : Y  IS PATIENT OUT OF MEDICTAION: Y  IF NOT; HOW MUCH IS LEFT:   LAST APPOINTMENT DATE: 02/14/17  NEXT APPOINTMENT DATE: 02/14/18  OTHER COMMENTS:  Patient has been doubling up on Saturday's and Sunday's so needed more than 30 day supply  **Let patient know to contact pharmacy at the end of the day to make sure medication is ready. **  ** Please notify patient to allow 48-72 hours to process**  **Encourage patient to contact the pharmacy for refills or they can request refills through [facility]**

## 2017-05-22 NOTE — Telephone Encounter (Signed)
Okay to put in labs for this patient as well?? Thanks!

## 2017-05-22 NOTE — Telephone Encounter (Signed)
Okay to submit.   Patient is taking double on Saturday and Sundays?   Thank you.

## 2017-05-22 NOTE — Telephone Encounter (Signed)
What lab orders can we place for this patient?

## 2017-05-23 ENCOUNTER — Other Ambulatory Visit: Payer: Self-pay | Admitting: Internal Medicine

## 2017-05-23 ENCOUNTER — Other Ambulatory Visit: Payer: Self-pay

## 2017-05-23 ENCOUNTER — Telehealth: Payer: Self-pay

## 2017-05-23 DIAGNOSIS — E890 Postprocedural hypothyroidism: Secondary | ICD-10-CM

## 2017-05-23 NOTE — Telephone Encounter (Signed)
Called and notified patient of lab orders and refill on medication. Patient understood and had no questions.

## 2017-05-24 ENCOUNTER — Other Ambulatory Visit: Payer: Self-pay | Admitting: Internal Medicine

## 2017-05-24 DIAGNOSIS — E890 Postprocedural hypothyroidism: Secondary | ICD-10-CM

## 2017-05-24 LAB — TSH: TSH: 0.127 u[IU]/mL — ABNORMAL LOW (ref 0.450–4.500)

## 2017-05-24 LAB — T4, FREE: Free T4: 1.97 ng/dL — ABNORMAL HIGH (ref 0.82–1.77)

## 2017-05-29 DIAGNOSIS — O008 Other ectopic pregnancy without intrauterine pregnancy: Secondary | ICD-10-CM | POA: Diagnosis not present

## 2017-06-02 DIAGNOSIS — Z3491 Encounter for supervision of normal pregnancy, unspecified, first trimester: Secondary | ICD-10-CM | POA: Diagnosis not present

## 2017-06-02 DIAGNOSIS — O09521 Supervision of elderly multigravida, first trimester: Secondary | ICD-10-CM | POA: Diagnosis not present

## 2017-06-02 DIAGNOSIS — Z3689 Encounter for other specified antenatal screening: Secondary | ICD-10-CM | POA: Diagnosis not present

## 2017-06-02 DIAGNOSIS — Z36 Encounter for antenatal screening for chromosomal anomalies: Secondary | ICD-10-CM | POA: Diagnosis not present

## 2017-06-02 DIAGNOSIS — Z3682 Encounter for antenatal screening for nuchal translucency: Secondary | ICD-10-CM | POA: Diagnosis not present

## 2017-06-02 LAB — OB RESULTS CONSOLE HEPATITIS B SURFACE ANTIGEN: Hepatitis B Surface Ag: NEGATIVE

## 2017-06-02 LAB — OB RESULTS CONSOLE HIV ANTIBODY (ROUTINE TESTING): HIV: NONREACTIVE

## 2017-06-02 LAB — OB RESULTS CONSOLE ABO/RH: RH Type: POSITIVE

## 2017-06-02 LAB — OB RESULTS CONSOLE RUBELLA ANTIBODY, IGM: Rubella: IMMUNE

## 2017-06-02 LAB — OB RESULTS CONSOLE RPR: RPR: NONREACTIVE

## 2017-06-02 LAB — OB RESULTS CONSOLE ANTIBODY SCREEN: Antibody Screen: NEGATIVE

## 2017-06-07 DIAGNOSIS — Z3689 Encounter for other specified antenatal screening: Secondary | ICD-10-CM | POA: Diagnosis not present

## 2017-06-07 DIAGNOSIS — Z3A12 12 weeks gestation of pregnancy: Secondary | ICD-10-CM | POA: Diagnosis not present

## 2017-06-07 DIAGNOSIS — O09521 Supervision of elderly multigravida, first trimester: Secondary | ICD-10-CM | POA: Diagnosis not present

## 2017-06-09 DIAGNOSIS — O09521 Supervision of elderly multigravida, first trimester: Secondary | ICD-10-CM | POA: Diagnosis not present

## 2017-06-09 DIAGNOSIS — Z3682 Encounter for antenatal screening for nuchal translucency: Secondary | ICD-10-CM | POA: Diagnosis not present

## 2017-06-09 DIAGNOSIS — Z3A12 12 weeks gestation of pregnancy: Secondary | ICD-10-CM | POA: Diagnosis not present

## 2017-06-20 ENCOUNTER — Encounter (HOSPITAL_COMMUNITY): Payer: Self-pay

## 2017-06-20 DIAGNOSIS — Z3A14 14 weeks gestation of pregnancy: Secondary | ICD-10-CM | POA: Diagnosis not present

## 2017-06-20 DIAGNOSIS — O09522 Supervision of elderly multigravida, second trimester: Secondary | ICD-10-CM | POA: Diagnosis not present

## 2017-06-22 ENCOUNTER — Other Ambulatory Visit (HOSPITAL_COMMUNITY): Payer: Self-pay | Admitting: Obstetrics and Gynecology

## 2017-06-22 DIAGNOSIS — Z3A16 16 weeks gestation of pregnancy: Secondary | ICD-10-CM

## 2017-06-22 DIAGNOSIS — O09522 Supervision of elderly multigravida, second trimester: Secondary | ICD-10-CM

## 2017-06-23 ENCOUNTER — Encounter (HOSPITAL_COMMUNITY): Payer: Self-pay | Admitting: *Deleted

## 2017-06-23 ENCOUNTER — Ambulatory Visit (HOSPITAL_COMMUNITY)
Admission: RE | Admit: 2017-06-23 | Discharge: 2017-06-23 | Disposition: A | Discharge status: Home / Self Care | Payer: BLUE CROSS/BLUE SHIELD | Source: Ambulatory Visit | Attending: Obstetrics and Gynecology | Admitting: Obstetrics and Gynecology

## 2017-06-23 ENCOUNTER — Telehealth: Payer: Self-pay | Admitting: Internal Medicine

## 2017-06-23 ENCOUNTER — Other Ambulatory Visit (HOSPITAL_COMMUNITY): Payer: Self-pay | Admitting: Obstetrics and Gynecology

## 2017-06-23 ENCOUNTER — Other Ambulatory Visit (HOSPITAL_COMMUNITY): Payer: Self-pay | Admitting: *Deleted

## 2017-06-23 ENCOUNTER — Other Ambulatory Visit: Payer: Self-pay | Admitting: Internal Medicine

## 2017-06-23 DIAGNOSIS — O4402 Placenta previa specified as without hemorrhage, second trimester: Secondary | ICD-10-CM

## 2017-06-23 DIAGNOSIS — O09522 Supervision of elderly multigravida, second trimester: Secondary | ICD-10-CM | POA: Diagnosis not present

## 2017-06-23 DIAGNOSIS — E890 Postprocedural hypothyroidism: Secondary | ICD-10-CM | POA: Diagnosis not present

## 2017-06-23 DIAGNOSIS — O34219 Maternal care for unspecified type scar from previous cesarean delivery: Secondary | ICD-10-CM

## 2017-06-23 DIAGNOSIS — O09299 Supervision of pregnancy with other poor reproductive or obstetric history, unspecified trimester: Secondary | ICD-10-CM | POA: Insufficient documentation

## 2017-06-23 DIAGNOSIS — Z3A15 15 weeks gestation of pregnancy: Secondary | ICD-10-CM

## 2017-06-23 DIAGNOSIS — O09819 Supervision of pregnancy resulting from assisted reproductive technology, unspecified trimester: Secondary | ICD-10-CM | POA: Diagnosis not present

## 2017-06-23 DIAGNOSIS — Z85850 Personal history of malignant neoplasm of thyroid: Secondary | ICD-10-CM | POA: Diagnosis not present

## 2017-06-23 DIAGNOSIS — Z3183 Encounter for assisted reproductive fertility procedure cycle: Secondary | ICD-10-CM

## 2017-06-23 DIAGNOSIS — O99212 Obesity complicating pregnancy, second trimester: Secondary | ICD-10-CM

## 2017-06-23 DIAGNOSIS — O09292 Supervision of pregnancy with other poor reproductive or obstetric history, second trimester: Secondary | ICD-10-CM

## 2017-06-23 DIAGNOSIS — O09812 Supervision of pregnancy resulting from assisted reproductive technology, second trimester: Secondary | ICD-10-CM

## 2017-06-23 DIAGNOSIS — Z3A16 16 weeks gestation of pregnancy: Secondary | ICD-10-CM

## 2017-06-23 DIAGNOSIS — Z363 Encounter for antenatal screening for malformations: Secondary | ICD-10-CM | POA: Insufficient documentation

## 2017-06-23 DIAGNOSIS — O3429 Maternal care due to uterine scar from other previous surgery: Secondary | ICD-10-CM

## 2017-06-23 NOTE — Telephone Encounter (Signed)
Faxed over orders, and called patient to advise they had been sent,.

## 2017-06-23 NOTE — Addendum Note (Signed)
Encounter addended by: Marko Stai, RDMS on: 06/23/2017 12:11 PM<BR>    Actions taken: Imaging Exam ended

## 2017-06-23 NOTE — Telephone Encounter (Signed)
Patient called in reference to wanting lab orders sent to [facility] at [place], [place], [place] [address] (phone number [phone]).  She wants them as soon as possible.   Please call back to confirm with her when the labs orders have been sent  Ty,  -LL

## 2017-06-23 NOTE — Progress Notes (Signed)
Maternal Fetal Medicine Consultation  Requesting Provider(s): [facility]  Primary OB: Taavon Reason for consultation: AMA, history of myomectomy, IVF pregnancy, history of thyroid cancer  HPI: 46 yo P1011 at 15+1 weeks, here for evaluation. She has been told that she may have a cesarean scar pregnancy. She has postsurgical hypothyoidism after total thyroidectomy for papillary thyroid carcinoma. She has AMA, but the IVF was with donor eggs from a 48 year old, and preimplantation genetic testing was done. She has had several myomectomies, the last being full thickness. She had a cesarean delivery at 37 weeks of a growth restricted baby 2 years ago OB History: OB History    Gravida Para Term Preterm AB Living   3 1 1   1 1    SAB TAB Ectopic Multiple Live Births     1   0 1      PMH:  Past Medical History:  Diagnosis Date  . Dermatitis   . Dysrhythmia    resolved- "grew out of it" - no problem since teenager  . Genital warts    removed  . Hypertension 2016   [place] with 2016 pregnancy / no BP meds since birth of child  . Newborn product of in vitro fertilization (IVF) pregnancy   . Termination of pregnancy (fetus)    x 1  . Thyroid cancer ([place])     PSH:  Past Surgical History:  Procedure Laterality Date  . BLADDER SURGERY  1992   Widened the urethra  . CESAREAN SECTION N/A 11/04/2014   Procedure: CESAREAN SECTION;  Surgeon: [name], MD;  Location: [facility];  Service: Obstetrics;  Laterality: N/A;  EDD: 11/23/14  . LYMPH NODE DISSECTION  03/12/2015   Procedure: LYMPH NODE DISSECTION;  Surgeon: Armandina Gemma, MD;  Location: [facility];  Service: General;;  . myomectomy  2008  . MYOMECTOMY  2014   Removed tubes bilaterally  . MYOMECTOMY N/A 11/04/2014   Procedure: MYOMECTOMY;  Surgeon: [name], MD;  Location: [facility];  Service: Obstetrics;  Laterality: N/A;  . THYROIDECTOMY N/A 03/12/2015   Procedure: TOTAL THYROIDECTOMY;  Surgeon: Armandina Gemma, MD;  Location: [facility];   Service: General;  Laterality: N/A;  . TUBAL LIGATION    . WISDOM TOOTH EXTRACTION     Meds: See EPIC section Allergies: Augmentin and Erythromycin FH: See EPIC section Soc: See EPIC section  Review of Systems: no vaginal bleeding or cramping/contractions, no LOF, no nausea/vomiting. All other systems reviewed and are negative.  PE:  VS: See EPIC section GEN: well-appearing female ABD: gravid, NT  Please see separate document for fetal ultrasound report.  A/P: 1. Advanced maternal age: the genetic risks are minimal due to the use of donor eggs at age 23 and preimplantation testing. Her age >15 does place her at higher risk for adverse perinatal outcomes. These include late stillbirth, gestational diabetes, PIH and IUGR. To this end, I have recommended she restart 81mg  ASA today. She should undergo a HgbA1C ASAP (she is getting MSAFP next week, would draw it at that time. Assuming it is normal would do 1h GTT at 26-28 weeks. She should have q4 week evaluations of growth by [place] in your office. I would initiate antepartum testing of your choice at 32 weeks. 2. Postsurgical hypothyroidism: her endocrinologist is following her and has recently decreased her levothyroxine dose due to suppressed TSH. This may explain her lack of weight gain. We will of course defer to her medical endocrinologist for management 3. Previous myomectomy/cesarean: The  patient has been told she has a cesarean scar pregnancy. I have reassured her that this is not the case. She has been on the internet extensively and has become quite worried. I DID let her know that she was at risk for abnormal placental invasion, such as accreta and percreta. The myometrium in the lower uterine segment overlying the bladder is thin and bears close followup I have made an appointment for her to return in 6 weeks when she is at 22 weeks. We will reassess the placental/myometrial interface at that time as well as complete her anatomic survey. A  second [place] will be planned here for 28 weeks for the same purpose. Delivery should be planned by repeat cesarean between 36-37 weeks due to the increased risk of uterine rupture associated with previous myomectomies  Thank you for the opportunity to be a part of the care of Dwaine Deter. Please contact our office if we can be of further assistance.   I spent approximately 30 minutes with this patient with over 50% of time spent in face-to-face counseling.

## 2017-06-24 LAB — TSH: TSH: 0.736 u[IU]/mL (ref 0.450–4.500)

## 2017-06-24 LAB — T4, FREE: Free T4: 1.42 ng/dL (ref 0.82–1.77)

## 2017-06-30 ENCOUNTER — Other Ambulatory Visit (HOSPITAL_COMMUNITY): Payer: BLUE CROSS/BLUE SHIELD

## 2017-06-30 ENCOUNTER — Encounter (HOSPITAL_COMMUNITY): Payer: BLUE CROSS/BLUE SHIELD

## 2017-07-05 DIAGNOSIS — Z3A16 16 weeks gestation of pregnancy: Secondary | ICD-10-CM | POA: Diagnosis not present

## 2017-07-05 DIAGNOSIS — Z361 Encounter for antenatal screening for raised alphafetoprotein level: Secondary | ICD-10-CM | POA: Diagnosis not present

## 2017-07-05 DIAGNOSIS — O09522 Supervision of elderly multigravida, second trimester: Secondary | ICD-10-CM | POA: Diagnosis not present

## 2017-07-13 ENCOUNTER — Encounter (HOSPITAL_COMMUNITY): Payer: BLUE CROSS/BLUE SHIELD

## 2017-07-13 ENCOUNTER — Other Ambulatory Visit (HOSPITAL_COMMUNITY): Payer: BLUE CROSS/BLUE SHIELD

## 2017-07-14 ENCOUNTER — Other Ambulatory Visit (HOSPITAL_COMMUNITY): Payer: BLUE CROSS/BLUE SHIELD

## 2017-07-14 ENCOUNTER — Encounter (HOSPITAL_COMMUNITY): Payer: BLUE CROSS/BLUE SHIELD

## 2017-07-14 ENCOUNTER — Ambulatory Visit (HOSPITAL_COMMUNITY): Payer: BLUE CROSS/BLUE SHIELD

## 2017-07-20 ENCOUNTER — Other Ambulatory Visit: Payer: Self-pay | Admitting: Internal Medicine

## 2017-07-20 DIAGNOSIS — O09522 Supervision of elderly multigravida, second trimester: Secondary | ICD-10-CM | POA: Diagnosis not present

## 2017-07-20 DIAGNOSIS — Z23 Encounter for immunization: Secondary | ICD-10-CM | POA: Diagnosis not present

## 2017-07-20 DIAGNOSIS — Z3A19 19 weeks gestation of pregnancy: Secondary | ICD-10-CM | POA: Diagnosis not present

## 2017-07-25 ENCOUNTER — Other Ambulatory Visit: Payer: Self-pay

## 2017-07-25 ENCOUNTER — Telehealth: Payer: Self-pay | Admitting: Internal Medicine

## 2017-07-25 ENCOUNTER — Telehealth: Payer: Self-pay

## 2017-07-25 DIAGNOSIS — E890 Postprocedural hypothyroidism: Secondary | ICD-10-CM

## 2017-07-25 NOTE — Telephone Encounter (Signed)
Patient is returning your call.  

## 2017-07-25 NOTE — Telephone Encounter (Signed)
Called patient, advised of message. Patient requested that we send over the last labs to [facility] and advise of Dr.Gherghe's message. Faxed over.

## 2017-07-25 NOTE — Telephone Encounter (Signed)
Patient said she needs to come in for lab work every 4 weeks due to pregnancy. She said it has been four weeks since her last lab work. Please call patient at [phone]) [phone] to advise.

## 2017-07-25 NOTE — Telephone Encounter (Signed)
Actually, once we got the dose right, she does not need labs that often. We can wait 1 more month if OK with her: can you please order TSH, fT4 for then?

## 2017-07-25 NOTE — Telephone Encounter (Signed)
Called patient, advised of MD note. Advised patient to call back to schedule lab appointment in one month. Will order labs.

## 2017-07-25 NOTE — Telephone Encounter (Signed)
Please advise, okay to schedule?

## 2017-07-31 ENCOUNTER — Other Ambulatory Visit (HOSPITAL_COMMUNITY): Payer: Self-pay

## 2017-08-03 ENCOUNTER — Encounter (HOSPITAL_COMMUNITY): Payer: Self-pay

## 2017-08-03 ENCOUNTER — Other Ambulatory Visit (HOSPITAL_COMMUNITY): Payer: Self-pay | Admitting: Obstetrics and Gynecology

## 2017-08-03 ENCOUNTER — Ambulatory Visit (HOSPITAL_COMMUNITY)
Admission: RE | Admit: 2017-08-03 | Discharge: 2017-08-03 | Disposition: A | Discharge status: Home / Self Care | Payer: BLUE CROSS/BLUE SHIELD | Source: Ambulatory Visit | Attending: Obstetrics and Gynecology | Admitting: Obstetrics and Gynecology

## 2017-08-03 DIAGNOSIS — O09812 Supervision of pregnancy resulting from assisted reproductive technology, second trimester: Secondary | ICD-10-CM | POA: Diagnosis not present

## 2017-08-03 DIAGNOSIS — Z0489 Encounter for examination and observation for other specified reasons: Secondary | ICD-10-CM

## 2017-08-03 DIAGNOSIS — IMO0002 Reserved for concepts with insufficient information to code with codable children: Secondary | ICD-10-CM

## 2017-08-03 DIAGNOSIS — O3412 Maternal care for benign tumor of corpus uteri, second trimester: Secondary | ICD-10-CM

## 2017-08-03 DIAGNOSIS — O3429 Maternal care due to uterine scar from other previous surgery: Secondary | ICD-10-CM

## 2017-08-03 DIAGNOSIS — Z3A21 21 weeks gestation of pregnancy: Secondary | ICD-10-CM

## 2017-08-03 DIAGNOSIS — O34219 Maternal care for unspecified type scar from previous cesarean delivery: Secondary | ICD-10-CM | POA: Diagnosis not present

## 2017-08-03 DIAGNOSIS — O99212 Obesity complicating pregnancy, second trimester: Secondary | ICD-10-CM

## 2017-08-03 DIAGNOSIS — D259 Leiomyoma of uterus, unspecified: Secondary | ICD-10-CM

## 2017-08-03 DIAGNOSIS — O09292 Supervision of pregnancy with other poor reproductive or obstetric history, second trimester: Secondary | ICD-10-CM | POA: Diagnosis not present

## 2017-08-03 DIAGNOSIS — O09299 Supervision of pregnancy with other poor reproductive or obstetric history, unspecified trimester: Secondary | ICD-10-CM

## 2017-08-03 DIAGNOSIS — O09522 Supervision of elderly multigravida, second trimester: Secondary | ICD-10-CM | POA: Diagnosis not present

## 2017-08-04 ENCOUNTER — Other Ambulatory Visit (HOSPITAL_COMMUNITY): Payer: Self-pay | Admitting: *Deleted

## 2017-08-04 DIAGNOSIS — O09523 Supervision of elderly multigravida, third trimester: Secondary | ICD-10-CM

## 2017-08-16 DIAGNOSIS — O09812 Supervision of pregnancy resulting from assisted reproductive technology, second trimester: Secondary | ICD-10-CM | POA: Diagnosis not present

## 2017-08-16 DIAGNOSIS — Z3A22 22 weeks gestation of pregnancy: Secondary | ICD-10-CM | POA: Diagnosis not present

## 2017-08-24 DIAGNOSIS — O09812 Supervision of pregnancy resulting from assisted reproductive technology, second trimester: Secondary | ICD-10-CM | POA: Diagnosis not present

## 2017-08-24 DIAGNOSIS — O09522 Supervision of elderly multigravida, second trimester: Secondary | ICD-10-CM | POA: Diagnosis not present

## 2017-08-24 DIAGNOSIS — Z3A23 23 weeks gestation of pregnancy: Secondary | ICD-10-CM | POA: Diagnosis not present

## 2017-08-28 ENCOUNTER — Other Ambulatory Visit (HOSPITAL_COMMUNITY): Payer: Self-pay

## 2017-08-30 DIAGNOSIS — E041 Nontoxic single thyroid nodule: Secondary | ICD-10-CM | POA: Diagnosis not present

## 2017-08-30 DIAGNOSIS — Z3A24 24 weeks gestation of pregnancy: Secondary | ICD-10-CM | POA: Diagnosis not present

## 2017-08-30 DIAGNOSIS — O09812 Supervision of pregnancy resulting from assisted reproductive technology, second trimester: Secondary | ICD-10-CM | POA: Diagnosis not present

## 2017-09-13 DIAGNOSIS — Z3689 Encounter for other specified antenatal screening: Secondary | ICD-10-CM | POA: Diagnosis not present

## 2017-09-13 DIAGNOSIS — Z3A26 26 weeks gestation of pregnancy: Secondary | ICD-10-CM | POA: Diagnosis not present

## 2017-09-13 DIAGNOSIS — O09812 Supervision of pregnancy resulting from assisted reproductive technology, second trimester: Secondary | ICD-10-CM | POA: Diagnosis not present

## 2017-09-20 DIAGNOSIS — Z3A27 27 weeks gestation of pregnancy: Secondary | ICD-10-CM | POA: Diagnosis not present

## 2017-09-20 DIAGNOSIS — O99810 Abnormal glucose complicating pregnancy: Secondary | ICD-10-CM | POA: Diagnosis not present

## 2017-09-26 ENCOUNTER — Encounter: Payer: Self-pay | Admitting: Internal Medicine

## 2017-09-26 NOTE — Progress Notes (Signed)
Received labs from Dr. Ronita Hipps, from 11/21/208: - TSH 0.58, free T4 1.42, both at goal

## 2017-09-28 ENCOUNTER — Other Ambulatory Visit: Payer: Self-pay | Admitting: Internal Medicine

## 2017-09-28 ENCOUNTER — Encounter (HOSPITAL_COMMUNITY): Payer: Self-pay

## 2017-09-28 ENCOUNTER — Other Ambulatory Visit (HOSPITAL_COMMUNITY): Payer: Self-pay | Admitting: Maternal & Fetal Medicine

## 2017-09-28 ENCOUNTER — Ambulatory Visit (HOSPITAL_COMMUNITY)
Admission: RE | Admit: 2017-09-28 | Discharge: 2017-09-28 | Disposition: A | Discharge status: Home / Self Care | Payer: BLUE CROSS/BLUE SHIELD | Source: Ambulatory Visit | Attending: Family Medicine | Admitting: Family Medicine

## 2017-09-28 DIAGNOSIS — D259 Leiomyoma of uterus, unspecified: Secondary | ICD-10-CM

## 2017-09-28 DIAGNOSIS — O09299 Supervision of pregnancy with other poor reproductive or obstetric history, unspecified trimester: Secondary | ICD-10-CM

## 2017-09-28 DIAGNOSIS — O09523 Supervision of elderly multigravida, third trimester: Secondary | ICD-10-CM

## 2017-09-28 DIAGNOSIS — O3413 Maternal care for benign tumor of corpus uteri, third trimester: Secondary | ICD-10-CM | POA: Diagnosis not present

## 2017-09-28 DIAGNOSIS — Z3A29 29 weeks gestation of pregnancy: Secondary | ICD-10-CM

## 2017-09-28 DIAGNOSIS — O09813 Supervision of pregnancy resulting from assisted reproductive technology, third trimester: Secondary | ICD-10-CM

## 2017-09-28 DIAGNOSIS — O34219 Maternal care for unspecified type scar from previous cesarean delivery: Secondary | ICD-10-CM | POA: Diagnosis not present

## 2017-09-28 DIAGNOSIS — O99213 Obesity complicating pregnancy, third trimester: Secondary | ICD-10-CM

## 2017-09-28 DIAGNOSIS — O3429 Maternal care due to uterine scar from other previous surgery: Secondary | ICD-10-CM | POA: Diagnosis not present

## 2017-09-29 DIAGNOSIS — Z3A29 29 weeks gestation of pregnancy: Secondary | ICD-10-CM | POA: Diagnosis not present

## 2017-09-29 DIAGNOSIS — O99810 Abnormal glucose complicating pregnancy: Secondary | ICD-10-CM | POA: Diagnosis not present

## 2017-09-29 DIAGNOSIS — Z3689 Encounter for other specified antenatal screening: Secondary | ICD-10-CM | POA: Diagnosis not present

## 2017-09-29 DIAGNOSIS — Z23 Encounter for immunization: Secondary | ICD-10-CM | POA: Diagnosis not present

## 2017-10-04 ENCOUNTER — Ambulatory Visit: Payer: BLUE CROSS/BLUE SHIELD

## 2017-10-06 DIAGNOSIS — Z3A30 30 weeks gestation of pregnancy: Secondary | ICD-10-CM | POA: Diagnosis not present

## 2017-10-06 DIAGNOSIS — O99810 Abnormal glucose complicating pregnancy: Secondary | ICD-10-CM | POA: Diagnosis not present

## 2017-10-11 ENCOUNTER — Encounter: Payer: Self-pay | Admitting: Registered"

## 2017-10-11 ENCOUNTER — Encounter: Payer: BLUE CROSS/BLUE SHIELD | Attending: Obstetrics and Gynecology | Admitting: Registered"

## 2017-10-11 DIAGNOSIS — R7309 Other abnormal glucose: Secondary | ICD-10-CM | POA: Insufficient documentation

## 2017-10-11 DIAGNOSIS — O99810 Abnormal glucose complicating pregnancy: Secondary | ICD-10-CM | POA: Diagnosis not present

## 2017-10-11 DIAGNOSIS — Z3A00 Weeks of gestation of pregnancy not specified: Secondary | ICD-10-CM | POA: Diagnosis not present

## 2017-10-11 NOTE — Progress Notes (Signed)
Patient was seen on 10/11/2017 for Gestational Diabetes self-management class at the [facility]. The following learning objectives were met by the patient during this course:   States the definition of Gestational Diabetes  States why dietary management is important in controlling blood glucose  Describes the effects each nutrient has on blood glucose levels  Demonstrates ability to create a balanced meal plan  Demonstrates carbohydrate counting   States when to check blood glucose levels  Demonstrates proper blood glucose monitoring techniques  States the effect of stress and exercise on blood glucose levels  States the importance of limiting caffeine and abstaining from alcohol and smoking  Blood glucose monitor given: none (pt owns & using meter) Lot # n/a Exp: n/a Blood glucose reading: n/a  Patient instructed to monitor glucose levels: FBS: 60 - <95 1 hour: <140 2 hour: <120  Patient received handouts:  Nutrition Diabetes and Pregnancy  Carbohydrate Counting List  Patient will be seen for follow-up as needed.

## 2017-10-12 DIAGNOSIS — O99810 Abnormal glucose complicating pregnancy: Secondary | ICD-10-CM | POA: Diagnosis not present

## 2017-10-12 DIAGNOSIS — Z3A31 31 weeks gestation of pregnancy: Secondary | ICD-10-CM | POA: Diagnosis not present

## 2017-10-18 ENCOUNTER — Encounter: Payer: Self-pay | Admitting: Internal Medicine

## 2017-10-18 DIAGNOSIS — O99810 Abnormal glucose complicating pregnancy: Secondary | ICD-10-CM | POA: Diagnosis not present

## 2017-10-18 DIAGNOSIS — Z85850 Personal history of malignant neoplasm of thyroid: Secondary | ICD-10-CM | POA: Diagnosis not present

## 2017-10-18 DIAGNOSIS — Z3A31 31 weeks gestation of pregnancy: Secondary | ICD-10-CM | POA: Diagnosis not present

## 2017-10-20 ENCOUNTER — Encounter: Payer: Self-pay | Admitting: Internal Medicine

## 2017-10-26 DIAGNOSIS — O09813 Supervision of pregnancy resulting from assisted reproductive technology, third trimester: Secondary | ICD-10-CM | POA: Diagnosis not present

## 2017-10-26 DIAGNOSIS — Z3A33 33 weeks gestation of pregnancy: Secondary | ICD-10-CM | POA: Diagnosis not present

## 2017-10-26 DIAGNOSIS — O24414 Gestational diabetes mellitus in pregnancy, insulin controlled: Secondary | ICD-10-CM | POA: Diagnosis not present

## 2017-10-29 ENCOUNTER — Other Ambulatory Visit: Payer: Self-pay | Admitting: Internal Medicine

## 2017-10-31 DIAGNOSIS — R109 Unspecified abdominal pain: Secondary | ICD-10-CM | POA: Diagnosis not present

## 2017-10-31 DIAGNOSIS — O133 Gestational [pregnancy-induced] hypertension without significant proteinuria, third trimester: Secondary | ICD-10-CM | POA: Diagnosis not present

## 2017-10-31 DIAGNOSIS — O09813 Supervision of pregnancy resulting from assisted reproductive technology, third trimester: Secondary | ICD-10-CM | POA: Diagnosis not present

## 2017-10-31 DIAGNOSIS — O24414 Gestational diabetes mellitus in pregnancy, insulin controlled: Secondary | ICD-10-CM | POA: Diagnosis not present

## 2017-10-31 DIAGNOSIS — Z3A33 33 weeks gestation of pregnancy: Secondary | ICD-10-CM | POA: Diagnosis not present

## 2017-11-03 ENCOUNTER — Other Ambulatory Visit: Payer: Self-pay | Admitting: Obstetrics and Gynecology

## 2017-11-03 DIAGNOSIS — Z3A34 34 weeks gestation of pregnancy: Secondary | ICD-10-CM | POA: Diagnosis not present

## 2017-11-03 DIAGNOSIS — O133 Gestational [pregnancy-induced] hypertension without significant proteinuria, third trimester: Secondary | ICD-10-CM | POA: Diagnosis not present

## 2017-11-07 DIAGNOSIS — O133 Gestational [pregnancy-induced] hypertension without significant proteinuria, third trimester: Secondary | ICD-10-CM | POA: Diagnosis not present

## 2017-11-07 DIAGNOSIS — Z3A34 34 weeks gestation of pregnancy: Secondary | ICD-10-CM | POA: Diagnosis not present

## 2017-11-07 DIAGNOSIS — O24414 Gestational diabetes mellitus in pregnancy, insulin controlled: Secondary | ICD-10-CM | POA: Diagnosis not present

## 2017-11-07 DIAGNOSIS — O09813 Supervision of pregnancy resulting from assisted reproductive technology, third trimester: Secondary | ICD-10-CM | POA: Diagnosis not present

## 2017-11-09 ENCOUNTER — Encounter (HOSPITAL_COMMUNITY): Payer: Self-pay

## 2017-11-10 DIAGNOSIS — Z3A35 35 weeks gestation of pregnancy: Secondary | ICD-10-CM | POA: Diagnosis not present

## 2017-11-10 DIAGNOSIS — Z3685 Encounter for antenatal screening for Streptococcus B: Secondary | ICD-10-CM | POA: Diagnosis not present

## 2017-11-10 DIAGNOSIS — O09813 Supervision of pregnancy resulting from assisted reproductive technology, third trimester: Secondary | ICD-10-CM | POA: Diagnosis not present

## 2017-11-10 DIAGNOSIS — O24414 Gestational diabetes mellitus in pregnancy, insulin controlled: Secondary | ICD-10-CM | POA: Diagnosis not present

## 2017-11-10 DIAGNOSIS — Z3689 Encounter for other specified antenatal screening: Secondary | ICD-10-CM | POA: Diagnosis not present

## 2017-11-10 DIAGNOSIS — O133 Gestational [pregnancy-induced] hypertension without significant proteinuria, third trimester: Secondary | ICD-10-CM | POA: Diagnosis not present

## 2017-11-14 DIAGNOSIS — O24414 Gestational diabetes mellitus in pregnancy, insulin controlled: Secondary | ICD-10-CM | POA: Diagnosis not present

## 2017-11-14 DIAGNOSIS — Z3A35 35 weeks gestation of pregnancy: Secondary | ICD-10-CM | POA: Diagnosis not present

## 2017-11-14 DIAGNOSIS — O133 Gestational [pregnancy-induced] hypertension without significant proteinuria, third trimester: Secondary | ICD-10-CM | POA: Diagnosis not present

## 2017-11-14 DIAGNOSIS — O09813 Supervision of pregnancy resulting from assisted reproductive technology, third trimester: Secondary | ICD-10-CM | POA: Diagnosis not present

## 2017-11-17 DIAGNOSIS — Z3A36 36 weeks gestation of pregnancy: Secondary | ICD-10-CM | POA: Diagnosis not present

## 2017-11-17 DIAGNOSIS — O133 Gestational [pregnancy-induced] hypertension without significant proteinuria, third trimester: Secondary | ICD-10-CM | POA: Diagnosis not present

## 2017-11-21 DIAGNOSIS — O133 Gestational [pregnancy-induced] hypertension without significant proteinuria, third trimester: Secondary | ICD-10-CM | POA: Diagnosis not present

## 2017-11-21 DIAGNOSIS — O24414 Gestational diabetes mellitus in pregnancy, insulin controlled: Secondary | ICD-10-CM | POA: Diagnosis not present

## 2017-11-21 DIAGNOSIS — Z3A36 36 weeks gestation of pregnancy: Secondary | ICD-10-CM | POA: Diagnosis not present

## 2017-11-21 DIAGNOSIS — O09813 Supervision of pregnancy resulting from assisted reproductive technology, third trimester: Secondary | ICD-10-CM | POA: Diagnosis not present

## 2017-11-22 NOTE — Patient Instructions (Signed)
Pamela [name]  11/22/2017   Your procedure is scheduled on:  11/24/2017  Enter through the Main Entrance of [facility] at [place] up the phone at the desk and dial [phone]  Call this number if you have problems the morning of surgery:[phone]  Remember:   Do not eat food:(After Midnight) Desps de medianoche.  Do not drink clear liquids: (6 Hours before arrival) 6 horas ante llegada.  Take these medicines the morning of surgery with A SIP OF WATER: take your labetalol and synthroid as prescribed.  Do not take your metformin after your morning dose on 2/14 and do not take it on the 15th.  You may take zantac if you wish to.   Do not wear jewelry, make-up or nail polish.  Do not wear lotions, powders, or perfumes. Do not wear deodorant.  Do not shave 48 hours prior to surgery.  Do not bring valuables to the hospital.  [facility] is not   responsible for any belongings or valuables brought to the hospital.  Contacts, dentures or bridgework may not be worn into surgery.  Leave suitcase in the car. After surgery it may be brought to your room.  For patients admitted to the hospital, checkout time is 11:00 AM the day of              discharge.    N/A   Please read over the following fact sheets that you were given:   Surgical Site Infection Prevention

## 2017-11-23 ENCOUNTER — Encounter (HOSPITAL_COMMUNITY)
Admission: RE | Admit: 2017-11-23 | Discharge: 2017-11-23 | Disposition: A | Discharge status: Home / Self Care | Payer: BLUE CROSS/BLUE SHIELD | Source: Ambulatory Visit | Attending: Obstetrics and Gynecology | Admitting: Obstetrics and Gynecology

## 2017-11-23 DIAGNOSIS — Z3A00 Weeks of gestation of pregnancy not specified: Secondary | ICD-10-CM | POA: Diagnosis not present

## 2017-11-23 DIAGNOSIS — O99284 Endocrine, nutritional and metabolic diseases complicating childbirth: Secondary | ICD-10-CM | POA: Diagnosis not present

## 2017-11-23 DIAGNOSIS — O34219 Maternal care for unspecified type scar from previous cesarean delivery: Secondary | ICD-10-CM | POA: Diagnosis not present

## 2017-11-23 DIAGNOSIS — D649 Anemia, unspecified: Secondary | ICD-10-CM | POA: Diagnosis not present

## 2017-11-23 DIAGNOSIS — O34211 Maternal care for low transverse scar from previous cesarean delivery: Secondary | ICD-10-CM | POA: Diagnosis not present

## 2017-11-23 DIAGNOSIS — Z23 Encounter for immunization: Secondary | ICD-10-CM | POA: Diagnosis not present

## 2017-11-23 DIAGNOSIS — O321XX0 Maternal care for breech presentation, not applicable or unspecified: Secondary | ICD-10-CM | POA: Diagnosis not present

## 2017-11-23 DIAGNOSIS — E039 Hypothyroidism, unspecified: Secondary | ICD-10-CM | POA: Diagnosis not present

## 2017-11-23 DIAGNOSIS — O134 Gestational [pregnancy-induced] hypertension without significant proteinuria, complicating childbirth: Secondary | ICD-10-CM | POA: Diagnosis not present

## 2017-11-23 DIAGNOSIS — Z3A37 37 weeks gestation of pregnancy: Secondary | ICD-10-CM | POA: Diagnosis not present

## 2017-11-23 DIAGNOSIS — O9902 Anemia complicating childbirth: Secondary | ICD-10-CM | POA: Diagnosis not present

## 2017-11-23 DIAGNOSIS — O3413 Maternal care for benign tumor of corpus uteri, third trimester: Secondary | ICD-10-CM | POA: Diagnosis not present

## 2017-11-23 DIAGNOSIS — O24425 Gestational diabetes mellitus in childbirth, controlled by oral hypoglycemic drugs: Secondary | ICD-10-CM | POA: Diagnosis not present

## 2017-11-23 DIAGNOSIS — D251 Intramural leiomyoma of uterus: Secondary | ICD-10-CM | POA: Diagnosis not present

## 2017-11-23 DIAGNOSIS — Z87891 Personal history of nicotine dependence: Secondary | ICD-10-CM | POA: Diagnosis not present

## 2017-11-23 HISTORY — DX: Lichen sclerosus et atrophicus: L90.0

## 2017-11-23 HISTORY — DX: Type 2 diabetes mellitus without complications: E11.9

## 2017-11-23 HISTORY — DX: Gestational (pregnancy-induced) hypertension without significant proteinuria, unspecified trimester: O13.9

## 2017-11-23 HISTORY — DX: Adverse effect of unspecified anesthetic, initial encounter: T41.45XA

## 2017-11-23 HISTORY — DX: Other complications of anesthesia, initial encounter: T88.59XA

## 2017-11-23 HISTORY — DX: Gestational diabetes mellitus in pregnancy, unspecified control: O24.419

## 2017-11-23 LAB — CBC
HCT: 38.7 % (ref 36.0–46.0)
Hemoglobin: 13.3 g/dL (ref 12.0–15.0)
MCH: 30.1 pg (ref 26.0–34.0)
MCHC: 34.4 g/dL (ref 30.0–36.0)
MCV: 87.6 fL (ref 78.0–100.0)
Platelets: 214 10*3/uL (ref 150–400)
RBC: 4.42 MIL/uL (ref 3.87–5.11)
RDW: 13.7 % (ref 11.5–15.5)
WBC: 10.8 10*3/uL — ABNORMAL HIGH (ref 4.0–10.5)

## 2017-11-23 NOTE — H&P (Signed)
NAMETERRIA, DESCHEPPER             ACCOUNT NO.:  [id number]  MEDICAL RECORD NO.:  [id number]  LOCATION:                                 FACILITY:  PHYSICIAN:  Lovenia Kim, M.D.DATE OF BIRTH:  1971/05/28  DATE OF ADMISSION: DATE OF DISCHARGE:                             HISTORY & PHYSICAL   CHIEF COMPLAINT:  History of myomectomy, for repeat C-section at 21 weeks.  HISTORY OF PRESENT ILLNESS:  She is a 47 year old white female, G3, P1, with a history of prior C-section, prior myomectomy, for repeat C- section at 37 weeks.  MEDICATIONS:  Include labetalol, Glucophage, prenatal vitamins, folic acid, Synthroid, and baby aspirin.  ALLERGIES:  She has allergies to erythromycin and Augmentin.  SOCIAL HISTORY:  She is a nonsmoker, nondrinker.  She denies domestic or physical violence.  FAMILY HISTORY:  Diabetes.  PERSONAL HISTORY:  Thyroidectomy, bladder surgery, genital wart removal, myomectomy in 2008 and 2014, and previous uncomplicated C-section.  Pregnancy is complicated by initial concerns of C-section scar implantation, which has been followed by subsequent normal evidence of anterior placentation.  No evidence of accreta by MFM ultrasound. Prenatal course otherwise complicated by gestational hypertension without evidence of preeclampsia.  Started on labetalol and betamethasone course was administered.  PHYSICAL EXAM:  GENERAL:  Well-developed, well-nourished white female, in no acute distress. HEENT:  Normal. NECK:  Supple.  Full range of motion. LUNGS:  Clear. HEART:  Regular rate and rhythm. ABDOMEN:  Soft, gravid, nontender. EXTREMITIES:  There are no cords. NEUROLOGIC:  Nonfocal. SKIN:  Intact.  IMPRESSION: 1. Thirty-seven week intrauterine pregnancy. BMZ completed. 2. Previous myomectomy. 3. Advanced maternal age. 4. IVF pregnancy. 5. History of questionable scar implantation with subsequent normal appearing placentation and negative evidence of  placenta accreta spectrum. 6. Gestational HTN on Labetalol 7. Breech presentation 8. Class A2 DM on Metformin  PLAN:  Proceed with repeat C-section.  Risks of anesthesia, infection, bleeding, injury to surrounding organs, and possible need for repair were discussed.  The possible risks for hysterectomy and transfusion complications discussed. Risks related to abnormal placentation discussed.  The patient acknowledges and wishes to proceed. Consent signed. Type and crossmatch 2U Discussed with Anesthesia IV Lysteda available - discussed with Pharmacy    Lovenia Kim, M.D.     RJT/MEDQ  D:  11/23/2017  T:  11/23/2017  Job:  [id number]

## 2017-11-24 ENCOUNTER — Inpatient Hospital Stay (HOSPITAL_COMMUNITY)
Admission: AD | Admit: 2017-11-24 | Payer: BLUE CROSS/BLUE SHIELD | Source: Ambulatory Visit | Admitting: Obstetrics and Gynecology

## 2017-11-24 ENCOUNTER — Inpatient Hospital Stay (HOSPITAL_COMMUNITY)
Admission: AD | Admit: 2017-11-24 | Discharge: 2017-11-26 | DRG: 788 | Disposition: A | Discharge status: Home / Self Care | Payer: BLUE CROSS/BLUE SHIELD | Source: Ambulatory Visit | Attending: Obstetrics and Gynecology | Admitting: Obstetrics and Gynecology

## 2017-11-24 ENCOUNTER — Encounter (HOSPITAL_COMMUNITY)
Admission: AD | Disposition: A | Discharge status: Home / Self Care | Payer: Self-pay | Source: Ambulatory Visit | Attending: Obstetrics and Gynecology

## 2017-11-24 ENCOUNTER — Inpatient Hospital Stay (HOSPITAL_COMMUNITY): Payer: BLUE CROSS/BLUE SHIELD | Admitting: Certified Registered Nurse Anesthetist

## 2017-11-24 ENCOUNTER — Encounter (HOSPITAL_COMMUNITY): Payer: Self-pay | Admitting: Anesthesiology

## 2017-11-24 DIAGNOSIS — O24419 Gestational diabetes mellitus in pregnancy, unspecified control: Secondary | ICD-10-CM

## 2017-11-24 DIAGNOSIS — O321XX0 Maternal care for breech presentation, not applicable or unspecified: Secondary | ICD-10-CM | POA: Diagnosis present

## 2017-11-24 DIAGNOSIS — Z98891 History of uterine scar from previous surgery: Secondary | ICD-10-CM

## 2017-11-24 DIAGNOSIS — O24425 Gestational diabetes mellitus in childbirth, controlled by oral hypoglycemic drugs: Secondary | ICD-10-CM | POA: Diagnosis not present

## 2017-11-24 DIAGNOSIS — D649 Anemia, unspecified: Secondary | ICD-10-CM | POA: Diagnosis present

## 2017-11-24 DIAGNOSIS — O34211 Maternal care for low transverse scar from previous cesarean delivery: Principal | ICD-10-CM | POA: Diagnosis present

## 2017-11-24 DIAGNOSIS — D251 Intramural leiomyoma of uterus: Secondary | ICD-10-CM | POA: Diagnosis present

## 2017-11-24 DIAGNOSIS — O139 Gestational [pregnancy-induced] hypertension without significant proteinuria, unspecified trimester: Secondary | ICD-10-CM | POA: Diagnosis present

## 2017-11-24 DIAGNOSIS — Z3A37 37 weeks gestation of pregnancy: Secondary | ICD-10-CM

## 2017-11-24 DIAGNOSIS — Z87891 Personal history of nicotine dependence: Secondary | ICD-10-CM | POA: Diagnosis not present

## 2017-11-24 DIAGNOSIS — Z3A00 Weeks of gestation of pregnancy not specified: Secondary | ICD-10-CM | POA: Diagnosis not present

## 2017-11-24 DIAGNOSIS — O134 Gestational [pregnancy-induced] hypertension without significant proteinuria, complicating childbirth: Secondary | ICD-10-CM | POA: Diagnosis present

## 2017-11-24 DIAGNOSIS — O99284 Endocrine, nutritional and metabolic diseases complicating childbirth: Secondary | ICD-10-CM | POA: Diagnosis present

## 2017-11-24 DIAGNOSIS — O9902 Anemia complicating childbirth: Secondary | ICD-10-CM | POA: Diagnosis present

## 2017-11-24 DIAGNOSIS — O3413 Maternal care for benign tumor of corpus uteri, third trimester: Secondary | ICD-10-CM | POA: Diagnosis present

## 2017-11-24 DIAGNOSIS — O34219 Maternal care for unspecified type scar from previous cesarean delivery: Secondary | ICD-10-CM | POA: Diagnosis not present

## 2017-11-24 DIAGNOSIS — E039 Hypothyroidism, unspecified: Secondary | ICD-10-CM | POA: Diagnosis present

## 2017-11-24 LAB — RPR: RPR Ser Ql: NONREACTIVE

## 2017-11-24 LAB — COMPREHENSIVE METABOLIC PANEL
ALT: 27 U/L (ref 14–54)
AST: 29 U/L (ref 15–41)
Albumin: 3 g/dL — ABNORMAL LOW (ref 3.5–5.0)
Alkaline Phosphatase: 178 U/L — ABNORMAL HIGH (ref 38–126)
Anion gap: 11 (ref 5–15)
BUN: 10 mg/dL (ref 6–20)
CO2: 19 mmol/L — ABNORMAL LOW (ref 22–32)
Calcium: 7.8 mg/dL — ABNORMAL LOW (ref 8.9–10.3)
Chloride: 105 mmol/L (ref 101–111)
Creatinine, Ser: 0.62 mg/dL (ref 0.44–1.00)
GFR calc Af Amer: >60 mL/min (ref >60)
GFR calc non Af Amer: >60 mL/min (ref >60)
Glucose, Bld: 83 mg/dL (ref 65–99)
Potassium: 3.9 mmol/L (ref 3.5–5.1)
Sodium: 135 mmol/L (ref 135–145)
Total Bilirubin: 1.1 mg/dL (ref 0.3–1.2)
Total Protein: 6.7 g/dL (ref 6.5–8.1)

## 2017-11-24 LAB — GLUCOSE, CAPILLARY
Glucose-Capillary: 82 mg/dL (ref 65–99)
Glucose-Capillary: 99 mg/dL (ref 65–99)

## 2017-11-24 LAB — PREPARE RBC (CROSSMATCH)

## 2017-11-24 SURGERY — Surgical Case
Anesthesia: Spinal

## 2017-11-24 MED ORDER — BUPIVACAINE HCL (PF) 0.25 % IJ SOLN
INTRAMUSCULAR | Status: DC | PRN
  Administered 2017-11-24: 20 mL

## 2017-11-24 MED ORDER — TRANEXAMIC ACID 1000 MG/10ML IV SOLN
1000.0000 mg | Freq: Once | INTRAVENOUS | Status: AC
  Administered 2017-11-24: 1000 mg via INTRAVENOUS
  Filled 2017-11-24: qty 10

## 2017-11-24 MED ORDER — LEVOTHYROXINE SODIUM 125 MCG PO TABS
125.0000 ug | ORAL_TABLET | Freq: Every day | ORAL | Status: DC
  Administered 2017-11-25 – 2017-11-26 (×2): 125 ug via ORAL
  Filled 2017-11-24 (×3): qty 1

## 2017-11-24 MED ORDER — SOD CITRATE-CITRIC ACID 500-334 MG/5ML PO SOLN
ORAL | Status: AC
  Filled 2017-11-24: qty 15

## 2017-11-24 MED ORDER — OXYTOCIN 10 UNIT/ML IJ SOLN
INTRAMUSCULAR | Status: AC
  Filled 2017-11-24: qty 1

## 2017-11-24 MED ORDER — KETOROLAC TROMETHAMINE 30 MG/ML IJ SOLN: 30.0000 mg | Freq: Four times a day (QID) | INTRAMUSCULAR | Status: DC | PRN

## 2017-11-24 MED ORDER — MORPHINE SULFATE (PF) 0.5 MG/ML IJ SOLN
INTRAMUSCULAR | Status: AC
  Filled 2017-11-24: qty 10

## 2017-11-24 MED ORDER — COCONUT OIL OIL: 1.0000 "application " | TOPICAL_OIL | Status: DC | PRN

## 2017-11-24 MED ORDER — BUPIVACAINE HCL (PF) 0.25 % IJ SOLN
INTRAMUSCULAR | Status: AC
  Filled 2017-11-24: qty 10

## 2017-11-24 MED ORDER — METOCLOPRAMIDE HCL 5 MG/ML IJ SOLN: 10.0000 mg | Freq: Once | INTRAMUSCULAR | Status: DC | PRN

## 2017-11-24 MED ORDER — LACTATED RINGERS IV SOLN
INTRAVENOUS | Status: DC
  Administered 2017-11-25: via INTRAVENOUS

## 2017-11-24 MED ORDER — LABETALOL HCL 100 MG PO TABS
100.0000 mg | ORAL_TABLET | Freq: Three times a day (TID) | ORAL | Status: DC
  Filled 2017-11-24: qty 1

## 2017-11-24 MED ORDER — MORPHINE SULFATE (PF) 0.5 MG/ML IJ SOLN
INTRAMUSCULAR | Status: DC | PRN
  Administered 2017-11-24: .2 mg via INTRATHECAL

## 2017-11-24 MED ORDER — PRENATAL MULTIVITAMIN CH
1.0000 | ORAL_TABLET | Freq: Every day | ORAL | Status: DC
  Administered 2017-11-25 – 2017-11-26 (×2): 1 via ORAL
  Filled 2017-11-24 (×2): qty 1

## 2017-11-24 MED ORDER — PHENYLEPHRINE 8 MG IN D5W 100 ML (0.08MG/ML) PREMIX OPTIME
INJECTION | INTRAVENOUS | Status: AC
  Filled 2017-11-24: qty 100

## 2017-11-24 MED ORDER — MEPERIDINE HCL 25 MG/ML IJ SOLN: 6.2500 mg | INTRAMUSCULAR | Status: DC | PRN

## 2017-11-24 MED ORDER — ONDANSETRON HCL 4 MG/2ML IJ SOLN
INTRAMUSCULAR | Status: DC | PRN
  Administered 2017-11-24: 4 mg via INTRAVENOUS

## 2017-11-24 MED ORDER — EPHEDRINE 5 MG/ML INJ
INTRAVENOUS | Status: AC
  Filled 2017-11-24: qty 10

## 2017-11-24 MED ORDER — ZOLPIDEM TARTRATE 5 MG PO TABS: 5.0000 mg | ORAL_TABLET | Freq: Every evening | ORAL | Status: DC | PRN

## 2017-11-24 MED ORDER — ACETAMINOPHEN 325 MG PO TABS
650.0000 mg | ORAL_TABLET | ORAL | Status: DC | PRN
  Administered 2017-11-25 – 2017-11-26 (×2): 650 mg via ORAL
  Filled 2017-11-24 (×2): qty 2

## 2017-11-24 MED ORDER — MISOPROSTOL 200 MCG PO TABS
800.0000 ug | ORAL_TABLET | Freq: Once | ORAL | Status: AC
  Administered 2017-11-24: 800 ug via RECTAL
  Filled 2017-11-24: qty 4

## 2017-11-24 MED ORDER — METHYLERGONOVINE MALEATE 0.2 MG PO TABS: 0.2000 mg | ORAL_TABLET | ORAL | Status: DC | PRN

## 2017-11-24 MED ORDER — SODIUM CHLORIDE 0.9 % IR SOLN
Status: DC | PRN
  Administered 2017-11-24: 1

## 2017-11-24 MED ORDER — FENTANYL CITRATE (PF) 100 MCG/2ML IJ SOLN: 25.0000 ug | INTRAMUSCULAR | Status: DC | PRN

## 2017-11-24 MED ORDER — IBUPROFEN 600 MG PO TABS
600.0000 mg | ORAL_TABLET | Freq: Four times a day (QID) | ORAL | Status: DC
  Administered 2017-11-25 – 2017-11-26 (×7): 600 mg via ORAL
  Filled 2017-11-24 (×7): qty 1

## 2017-11-24 MED ORDER — METHYLERGONOVINE MALEATE 0.2 MG/ML IJ SOLN: 0.2000 mg | INTRAMUSCULAR | Status: DC | PRN

## 2017-11-24 MED ORDER — FENTANYL CITRATE (PF) 100 MCG/2ML IJ SOLN
INTRAMUSCULAR | Status: AC
  Filled 2017-11-24: qty 2

## 2017-11-24 MED ORDER — DEXAMETHASONE SODIUM PHOSPHATE 4 MG/ML IJ SOLN
INTRAMUSCULAR | Status: AC
  Filled 2017-11-24: qty 1

## 2017-11-24 MED ORDER — FENTANYL CITRATE (PF) 100 MCG/2ML IJ SOLN
INTRAMUSCULAR | Status: DC | PRN
  Administered 2017-11-24: 20 ug via INTRATHECAL

## 2017-11-24 MED ORDER — DIPHENHYDRAMINE HCL 25 MG PO CAPS: 25.0000 mg | ORAL_CAPSULE | Freq: Four times a day (QID) | ORAL | Status: DC | PRN

## 2017-11-24 MED ORDER — MENTHOL 3 MG MT LOZG: 1.0000 | LOZENGE | OROMUCOSAL | Status: DC | PRN

## 2017-11-24 MED ORDER — OXYTOCIN 40 UNITS IN LACTATED RINGERS INFUSION - SIMPLE MED: 2.5000 [IU]/h | INTRAVENOUS | Status: DC

## 2017-11-24 MED ORDER — BUPIVACAINE IN DEXTROSE 0.75-8.25 % IT SOLN
INTRATHECAL | Status: AC
  Filled 2017-11-24: qty 2

## 2017-11-24 MED ORDER — LACTATED RINGERS IV SOLN
INTRAVENOUS | Status: DC
  Administered 2017-11-24: 13:00:00 via INTRAVENOUS

## 2017-11-24 MED ORDER — WITCH HAZEL-GLYCERIN EX PADS: 1.0000 "application " | MEDICATED_PAD | CUTANEOUS | Status: DC | PRN

## 2017-11-24 MED ORDER — SIMETHICONE 80 MG PO CHEW
80.0000 mg | CHEWABLE_TABLET | Freq: Three times a day (TID) | ORAL | Status: DC
  Administered 2017-11-24 – 2017-11-26 (×5): 80 mg via ORAL
  Filled 2017-11-24 (×4): qty 1

## 2017-11-24 MED ORDER — DIBUCAINE 1 % RE OINT: 1.0000 "application " | TOPICAL_OINTMENT | RECTAL | Status: DC | PRN

## 2017-11-24 MED ORDER — LACTATED RINGERS IV SOLN
INTRAVENOUS | Status: DC
  Administered 2017-11-24 (×3): via INTRAVENOUS

## 2017-11-24 MED ORDER — KETOROLAC TROMETHAMINE 30 MG/ML IJ SOLN
30.0000 mg | Freq: Four times a day (QID) | INTRAMUSCULAR | Status: DC | PRN
  Administered 2017-11-24: 30 mg via INTRAMUSCULAR

## 2017-11-24 MED ORDER — OXYCODONE-ACETAMINOPHEN 5-325 MG PO TABS: 1.0000 | ORAL_TABLET | ORAL | Status: DC | PRN

## 2017-11-24 MED ORDER — PHENYLEPHRINE 8 MG IN D5W 100 ML (0.08MG/ML) PREMIX OPTIME
INJECTION | INTRAVENOUS | Status: DC | PRN
  Administered 2017-11-24: 50 ug/min via INTRAVENOUS
  Administered 2017-11-24: 20 ug/min via INTRAVENOUS

## 2017-11-24 MED ORDER — SOD CITRATE-CITRIC ACID 500-334 MG/5ML PO SOLN
30.0000 mL | Freq: Once | ORAL | Status: AC
  Administered 2017-11-24: 30 mL via ORAL

## 2017-11-24 MED ORDER — BUPIVACAINE IN DEXTROSE 0.75-8.25 % IT SOLN
INTRATHECAL | Status: DC | PRN
  Administered 2017-11-24: 1.8 mL via INTRATHECAL

## 2017-11-24 MED ORDER — SIMETHICONE 80 MG PO CHEW
80.0000 mg | CHEWABLE_TABLET | ORAL | Status: DC
  Administered 2017-11-25 (×2): 80 mg via ORAL
  Filled 2017-11-24 (×2): qty 1

## 2017-11-24 MED ORDER — CEFAZOLIN SODIUM-DEXTROSE 2-4 GM/100ML-% IV SOLN
2.0000 g | INTRAVENOUS | Status: AC
  Administered 2017-11-24: 2 g via INTRAVENOUS
  Filled 2017-11-24: qty 100

## 2017-11-24 MED ORDER — OXYTOCIN 10 UNIT/ML IJ SOLN
INTRAVENOUS | Status: DC | PRN
  Administered 2017-11-24: 40 [IU] via INTRAVENOUS

## 2017-11-24 MED ORDER — ONDANSETRON HCL 4 MG/2ML IJ SOLN
INTRAMUSCULAR | Status: AC
  Filled 2017-11-24: qty 2

## 2017-11-24 MED ORDER — EPHEDRINE SULFATE 50 MG/ML IJ SOLN
INTRAMUSCULAR | Status: DC | PRN
  Administered 2017-11-24 (×3): 10 mg via INTRAVENOUS
  Administered 2017-11-24: 20 mg via INTRAVENOUS
  Administered 2017-11-24: 10 mg via INTRAVENOUS

## 2017-11-24 MED ORDER — KETOROLAC TROMETHAMINE 30 MG/ML IJ SOLN
INTRAMUSCULAR | Status: AC
  Filled 2017-11-24: qty 1

## 2017-11-24 MED ORDER — SODIUM CHLORIDE 0.9 % IV SOLN
Freq: Once | INTRAVENOUS | Status: AC
  Administered 2017-11-24: 19:00:00 via INTRAVENOUS

## 2017-11-24 MED ORDER — TETANUS-DIPHTH-ACELL PERTUSSIS 5-2.5-18.5 LF-MCG/0.5 IM SUSP: 0.5000 mL | Freq: Once | INTRAMUSCULAR | Status: DC

## 2017-11-24 MED ORDER — SCOPOLAMINE 1 MG/3DAYS TD PT72
1.0000 | MEDICATED_PATCH | Freq: Once | TRANSDERMAL | Status: DC
  Administered 2017-11-24: 1.5 mg via TRANSDERMAL

## 2017-11-24 MED ORDER — OXYCODONE-ACETAMINOPHEN 5-325 MG PO TABS: 2.0000 | ORAL_TABLET | ORAL | Status: DC | PRN

## 2017-11-24 MED ORDER — SIMETHICONE 80 MG PO CHEW: 80.0000 mg | CHEWABLE_TABLET | ORAL | Status: DC | PRN

## 2017-11-24 MED ORDER — SCOPOLAMINE 1 MG/3DAYS TD PT72
MEDICATED_PATCH | TRANSDERMAL | Status: AC
  Filled 2017-11-24: qty 1

## 2017-11-24 MED ORDER — SODIUM CHLORIDE 0.9 % IJ SOLN
INTRAMUSCULAR | Status: AC
  Filled 2017-11-24: qty 20

## 2017-11-24 MED ORDER — SENNOSIDES-DOCUSATE SODIUM 8.6-50 MG PO TABS
2.0000 | ORAL_TABLET | ORAL | Status: DC
  Administered 2017-11-25 (×2): 2 via ORAL
  Filled 2017-11-24 (×2): qty 2

## 2017-11-24 SURGICAL SUPPLY — 35 items
CHLORAPREP W/TINT 26ML (MISCELLANEOUS) ×2 IMPLANT
CLAMP CORD UMBIL (MISCELLANEOUS) IMPLANT
CLOTH BEACON ORANGE TIMEOUT ST (SAFETY) ×2 IMPLANT
DERMABOND ADVANCED (GAUZE/BANDAGES/DRESSINGS) ×2
DERMABOND ADVANCED .7 DNX12 (GAUZE/BANDAGES/DRESSINGS) ×2 IMPLANT
DRSG OPSITE POSTOP 4X10 (GAUZE/BANDAGES/DRESSINGS) ×2 IMPLANT
ELECT REM PT RETURN 9FT ADLT (ELECTROSURGICAL) ×2
ELECTRODE REM PT RTRN 9FT ADLT (ELECTROSURGICAL) ×1 IMPLANT
EXTRACTOR VACUUM M CUP 4 TUBE (SUCTIONS) IMPLANT
GLOVE BIO SURGEON STRL SZ7.5 (GLOVE) ×2 IMPLANT
GLOVE BIOGEL PI IND STRL 7.0 (GLOVE) ×1 IMPLANT
GLOVE BIOGEL PI INDICATOR 7.0 (GLOVE) ×1
GOWN STRL REUS W/TWL LRG LVL3 (GOWN DISPOSABLE) ×4 IMPLANT
KIT ABG SYR 3ML LUER SLIP (SYRINGE) IMPLANT
NEEDLE HYPO 22GX1.5 SAFETY (NEEDLE) ×2 IMPLANT
NEEDLE HYPO 25X5/8 SAFETYGLIDE (NEEDLE) IMPLANT
NEEDLE SPNL 20GX3.5 QUINCKE YW (NEEDLE) IMPLANT
NEEDLE SPNL 22GX3.5 QUINCKE BK (NEEDLE) ×2 IMPLANT
NS IRRIG 1000ML POUR BTL (IV SOLUTION) ×2 IMPLANT
PACK C SECTION WH (CUSTOM PROCEDURE TRAY) ×2 IMPLANT
PENCIL SMOKE EVAC W/HOLSTER (ELECTROSURGICAL) ×2 IMPLANT
SUT MNCRL 0 VIOLET CTX 36 (SUTURE) ×2 IMPLANT
SUT MNCRL AB 0 CT1 27 (SUTURE) ×2 IMPLANT
SUT MNCRL AB 3-0 PS2 27 (SUTURE) ×2 IMPLANT
SUT MON AB 2-0 CT1 27 (SUTURE) ×2 IMPLANT
SUT MON AB-0 CT1 36 (SUTURE) ×6 IMPLANT
SUT MONOCRYL 0 CTX 36 (SUTURE) ×2
SUT PLAIN 0 NONE (SUTURE) IMPLANT
SUT PLAIN 2 0 (SUTURE)
SUT PLAIN 2 0 XLH (SUTURE) ×2 IMPLANT
SUT PLAIN ABS 2-0 CT1 27XMFL (SUTURE) IMPLANT
SYR 20CC LL (SYRINGE) IMPLANT
SYR CONTROL 10ML LL (SYRINGE) ×4 IMPLANT
TOWEL OR 17X24 6PK STRL BLUE (TOWEL DISPOSABLE) ×2 IMPLANT
TRAY FOLEY BAG SILVER LF 14FR (SET/KITS/TRAYS/PACK) ×2 IMPLANT

## 2017-11-24 NOTE — Anesthesia Procedure Notes (Signed)
Spinal  Patient location during procedure: OR Start time: 11/24/2017 12:59 PM Staffing Anesthesiologist: Josephine Igo, MD Performed: anesthesiologist  Preanesthetic Checklist Completed: patient identified, site marked, surgical consent, pre-op evaluation, timeout performed, IV checked, risks and benefits discussed and monitors and equipment checked Spinal Block Patient position: sitting Prep: site prepped and draped and DuraPrep Patient monitoring: cardiac monitor, continuous pulse ox, blood pressure and heart rate Approach: midline Location: L3-4 Injection technique: catheter Needle Needle type: Tuohy and Spinocan  Needle gauge: 24 G Needle length: 12.7 cm Needle insertion depth: 7 cm Catheter type: closed end flexible Catheter size: 19 g Catheter at skin depth: 11 cm Assessment Sensory level: T4 Additional Notes Epidural performed as above. LOR with air at 7cm. SAB performed through Touhy needle with 25 Ga spinocan. CSF clear, free flow, transient paresthesia right leg which resolved, no heme. LA + Narcotics injected through spinal needle. Spinal needle withdrawn and epidural catheter threaded 5cm into the epidural space. Epidural needle withdrawn and sterile dressing applied. Patient tolerated procedure well. Adequate sensory level.

## 2017-11-24 NOTE — Anesthesia Postprocedure Evaluation (Signed)
Anesthesia Post Note  Patient: Pamela [name]  Procedure(s) Performed: Repeat CESAREAN SECTION (N/A )     Patient location during evaluation: PACU Anesthesia Type: Combined Spinal/Epidural Level of consciousness: oriented and awake and alert Pain management: pain level controlled Vital Signs Assessment: post-procedure vital signs reviewed and stable Respiratory status: spontaneous breathing, nonlabored ventilation and respiratory function stable Cardiovascular status: blood pressure returned to baseline and stable Postop Assessment: no headache, no backache, spinal receding, no apparent nausea or vomiting and patient able to bend at knees Anesthetic complications: no    Last Vitals:  Vitals:   11/24/17 1500 11/24/17 1515  BP: 109/73 116/83  Pulse: (!) 58 (!) 54  Resp: 19 14  Temp:    SpO2: 100% 100%    Last Pain:  Vitals:   11/24/17 1420  TempSrc: Oral   Pain Goal: Patients Stated Pain Goal: 4 (11/24/17 1128)               Bretta Fees A.

## 2017-11-24 NOTE — Progress Notes (Signed)
11/24/2017    1600  Epidural catheter removed-tip intact

## 2017-11-24 NOTE — Anesthesia Preprocedure Evaluation (Signed)
Anesthesia Evaluation  Patient identified by MRN, date of birth, ID band Patient awake    Reviewed: Allergy & Precautions, NPO status , Patient's Chart, lab work & pertinent test results  History of Anesthesia Complications (+) history of anesthetic complications  Airway Mallampati: II  TM Distance: >3 FB Neck ROM: Full    Dental no notable dental hx. (+) Teeth Intact   Pulmonary former smoker,    Pulmonary exam normal breath sounds clear to auscultation       Cardiovascular hypertension, Normal cardiovascular exam+ dysrhythmias  Rhythm:Regular Rate:Normal  Hx/o PIH with last pregnancy not currently on Rx   Neuro/Psych negative neurological ROS  negative psych ROS   GI/Hepatic   Endo/Other  diabetes, Well Controlled, Gestational, Oral Hypoglycemic AgentsHypothyroidism   Renal/GU   negative genitourinary   Musculoskeletal   Abdominal (+) + obese,   Peds  Hematology  (+) anemia ,   Anesthesia Other Findings   Reproductive/Obstetrics (+) Pregnancy Hx/o IVF this pregnancy AMA Previous C/Section x 1 Previous myomectomy ? Placental accreta early pregnancy- appears to have resloved                             Anesthesia Physical Anesthesia Plan  ASA: III  Anesthesia Plan: Combined Spinal and Epidural   Post-op Pain Management:    Induction:   PONV Risk Score and Plan: 3 and Ondansetron, Dexamethasone, Scopolamine patch - Pre-op and Treatment may vary due to age or medical condition  Airway Management Planned: Natural Airway  Additional Equipment:   Intra-op Plan:   Post-operative Plan:   Informed Consent: I have reviewed the patients History and Physical, chart, labs and discussed the procedure including the risks, benefits and alternatives for the proposed anesthesia with the patient or authorized representative who has indicated his/her understanding and acceptance.   Dental  advisory given  Plan Discussed with: CRNA, Anesthesiologist and Surgeon  Anesthesia Plan Comments:         Anesthesia Quick Evaluation

## 2017-11-24 NOTE — Transfer of Care (Signed)
Immediate Anesthesia Transfer of Care Note  Patient: Pamela [name]  Procedure(s) Performed: Repeat CESAREAN SECTION (N/A )  Patient Location: PACU  Anesthesia Type:Spinal  Level of Consciousness: awake, alert  and oriented  Airway & Oxygen Therapy: Patient Spontanous Breathing  Post-op Assessment: Report given to RN and Post -op Vital signs reviewed and stable  Post vital signs: Reviewed and stable  Last Vitals:  Vitals:   11/24/17 1128  BP: (!) 159/98  Pulse: (!) 52  Resp: 18  Temp: 36.8 C    Last Pain:  Vitals:   11/24/17 1128  TempSrc: Oral      Patients Stated Pain Goal: 4 (91/47/82 9562)  Complications: No apparent anesthesia complications

## 2017-11-24 NOTE — Op Note (Signed)
Cesarean Section Procedure Note  Indications: previous myomectomy and Gestational HTN  Pre-operative Diagnosis: 37 week 1 day pregnancy.  Post-operative Diagnosis: same  Surgeon: Lovenia Kim   Assistants: Garwin Brothers, MD  Anesthesia: Epidural anesthesia, Local anesthesia 0.25.% bupivacaine and Spinal anesthesia  ASA Class: 2  Procedure Details  The patient was seen in the Holding Room. The risks, benefits, complications, treatment options, and expected outcomes were discussed with the patient.  The patient concurred with the proposed plan, giving informed consent. The risks of anesthesia, infection, bleeding and possible injury to other organs discussed. Injury to bowel, bladder, or ureter with possible need for repair discussed. Possible need for transfusion with secondary risks of hepatitis or HIV acquisition discussed. Post operative complications to include but not limited to DVT, PE and Pneumonia noted. The site of surgery properly noted/marked. The patient was taken to Operating Room # 9, identified as Pamela [name] and the procedure verified as C-Section Delivery. A Time Out was held and the above information confirmed.  After induction of anesthesia, the patient was draped and prepped in the usual sterile manner. A Pfannenstiel incision was made and carried down through the subcutaneous tissue to the fascia. Fascial incision was made and extended transversely using Mayo scissors. The fascia was separated from the underlying rectus tissue superiorly and inferiorly. The peritoneum was identified and entered. Peritoneal incision was extended longitudinally. The utero-vesical peritoneal reflection was incised transversely and the bladder flap was bluntly freed from the lower uterine segment. A low transverse uterine incision(Kerr hysterotomy) was made. Delivered from frank breech presentation was a  female with Apgar scores of 8 at one minute and 9 at five minutes. Bulb suctioning gently  performed. Neonatal team in attendance.After the umbilical cord was clamped and cut cord blood was obtained for evaluation. The placenta was removed intact and appeared focally adherent in the mid  LUS. Dissected sharply with adherent endometrium and removed with placenta. The uterus was curetted with a dry lap pack. Good hemostasis was noted.The uterine outline had multiple intramural fibroids with both ovaries appeared normal but adherent to post uterine wall. The uterine incision was closed with running locked sutures of 0 Monocryl x 2 layers. Hemostasis was observed. The parietal peritoneum was closed with a running 2-0 Monocryl suture. The fascia was then reapproximated with running sutures of 0 Monocryl. The skin was reapproximated with 3--0 monocryl after [place] closure with 2-0 plain.  Instrument, sponge, and needle counts were correct prior the abdominal closure and at the conclusion of the case.   Findings: FLTF, anterior placenta, focal accreta, absent tubes, multiple small intramural fibroids. Nl ovaries.  Estimated Blood Loss:  600         Drains: foley                 Specimens: placenta                 Complications:  None; patient tolerated the procedure well.         Disposition: PACU - hemodynamically stable.         Condition: stable  Attending Attestation: I performed the procedure.

## 2017-11-24 NOTE — Lactation Note (Signed)
This note was copied from a baby's chart. Lactation Consultation Note  Patient Name: Pamela [name] GYJEH'U Date: 11/24/2017 Reason for consult: Initial assessment;Difficult latch;Early term 37-38.6wks;Maternal endocrine disorder Type of Endocrine Disorder?: Diabetes  Visited with P2 Mom in the PACU, baby 1 hr old.  Mom a GDM, on metformin, AMA, history of infertility.    Baby rooting and trying to latch.  "Fayne Mediate, requested assistance.  Baby in football hold, and lying against the breast.  Assisted with laid back position while sandwiching breast.  Nipples erect, small and short shafted. Baby opening slightly, not wide enough to latch deeply and sustain a latch.  Hand expression demonstrated and colostrum drops expressed.  Repeatedly attempts to assist baby.  Initiated a 20 mm nipple with instruction on how to apply.  With assistance, baby able to attain a fairly deep latch.  Mom assisted with sandwiching breast in a U hold.  Showed Mom how to assess lower lip and pull on chin.  Baby taken off the breast at one point, and nipple pulled well into shield, and colostrum noted inside.  Mom pleased with this.    Mom has a history of low milk supply with 1st baby.  She stated she ended up pumping and bottle feeding for 6 weeks.  Recommended to Mom that she ask her [facility] RN to set up a DEBP and assist her to pump both breasts 15-20 mins after breastfeeding.  Recommended breast massage and hand expression also.  To ask for assistance with spoon feeding colostrum back to baby.  Will recommend Lactation follow up after discharge.  Maternal Data Formula Feeding for Exclusion: No Has patient been taught Hand Expression?: Yes Does the patient have breastfeeding experience prior to this delivery?: Yes   LATCH Score Latch: Repeated attempts needed to sustain latch, nipple held in mouth throughout feeding, stimulation needed to elicit sucking reflex.  Audible Swallowing: A few with  stimulation  Type of Nipple: Everted at rest and after stimulation(small nipples with short nipple shafts)  Comfort (Breast/Nipple): Soft / non-tender  Hold (Positioning): Assistance needed to correctly position infant at breast and maintain latch.  LATCH Score: 7  Interventions Interventions: Assisted with latch;Skin to skin;Breast massage;Hand express;Breast compression;Adjust position;Support pillows;Position options;Expressed milk  Lactation Tools Discussed/Used Tools: Nipple Shields Nipple shield size: 20   Consult Status Consult Status: Follow-up Date: 11/25/17 Follow-up type: [place] 11/24/2017, 3:18 PM

## 2017-11-25 ENCOUNTER — Other Ambulatory Visit: Payer: Self-pay

## 2017-11-25 ENCOUNTER — Encounter (HOSPITAL_COMMUNITY): Payer: Self-pay | Admitting: Obstetrics and Gynecology

## 2017-11-25 DIAGNOSIS — O24419 Gestational diabetes mellitus in pregnancy, unspecified control: Secondary | ICD-10-CM

## 2017-11-25 DIAGNOSIS — O139 Gestational [pregnancy-induced] hypertension without significant proteinuria, unspecified trimester: Secondary | ICD-10-CM | POA: Diagnosis present

## 2017-11-25 DIAGNOSIS — O9902 Anemia complicating childbirth: Secondary | ICD-10-CM | POA: Diagnosis not present

## 2017-11-25 LAB — CBC
HCT: 26.9 % — ABNORMAL LOW (ref 36.0–46.0)
Hemoglobin: 9.4 g/dL — ABNORMAL LOW (ref 12.0–15.0)
MCH: 30.4 pg (ref 26.0–34.0)
MCHC: 34.9 g/dL (ref 30.0–36.0)
MCV: 87.1 fL (ref 78.0–100.0)
Platelets: 193 10*3/uL (ref 150–400)
RBC: 3.09 MIL/uL — ABNORMAL LOW (ref 3.87–5.11)
RDW: 13.8 % (ref 11.5–15.5)
WBC: 14.6 10*3/uL — ABNORMAL HIGH (ref 4.0–10.5)

## 2017-11-25 LAB — ABO/RH: ABO/RH(D): O POS

## 2017-11-25 MED ORDER — POLYSACCHARIDE IRON COMPLEX 150 MG PO CAPS
150.0000 mg | ORAL_CAPSULE | Freq: Every day | ORAL | Status: DC
  Administered 2017-11-25 – 2017-11-26 (×2): 150 mg via ORAL
  Filled 2017-11-25 (×2): qty 1

## 2017-11-25 MED ORDER — LABETALOL HCL 100 MG PO TABS: 100.0000 mg | ORAL_TABLET | Freq: Three times a day (TID) | ORAL | Status: DC

## 2017-11-25 MED ORDER — MAGNESIUM OXIDE 400 (241.3 MG) MG PO TABS
400.0000 mg | ORAL_TABLET | Freq: Every day | ORAL | Status: DC
  Administered 2017-11-25 – 2017-11-26 (×2): 400 mg via ORAL
  Filled 2017-11-25 (×3): qty 1

## 2017-11-25 NOTE — Progress Notes (Signed)
Subjective: POD# 1 Information for the patient's newborn:  Pamela, [place] Girl [name] [[id number]]  female   [name]  Reports feeling well. Feeding: breast and bottle Patient reports tolerating PO.  Breast symptoms: difficulty latching Pain controlled withibuprofen (OTC) Denies HA/SOB/C/P/N/V/dizziness. Flatus absent. She reports vaginal bleeding as normal, without clots.  She is ambulating, urinating without difficulty.     Objective:   VS:    Vitals:   11/25/17 0208 11/25/17 0420 11/25/17 0602 11/25/17 0900  BP:  (!) 101/52 (!) 98/51 105/63  Pulse: (!) 56 (!) 53 (!) 51 60  Resp:    19  Temp:  98.3 F (36.8 C)  98.5 F (36.9 C)  TempSrc:  Oral    SpO2: 96% 95% 97% 98%  Weight:      Height:          Intake/Output Summary (Last 24 hours) at 11/25/2017 1014 Last data filed at 11/25/2017 0800 Gross per 24 hour  Intake 4625.42 ml  Output 2394 ml  Net 2231.42 ml        Recent Labs    11/23/17 1035 11/25/17 0526  WBC 10.8* 14.6*  HGB 13.3 9.4*  HCT 38.7 26.9*  PLT 214 193     Blood type: --/--/O POS (02/14 1035)  Rubella: Immune (08/24 0000)     Physical Exam:  General: alert, cooperative and no distress CV: Regular rate and rhythm Resp: clear Abdomen: soft, nontender, normal bowel sounds Incision: clean, dry and intact Uterine Fundus: firm, below umbilicus, nontender Lochia: minimal Ext: no edema, redness or tenderness in the calves or thighs      Assessment/Plan: 47 y.o.   POD# 1. [id number]                  Principal Problem:   S/P cesarean section 2/15 Active Problems:   Postpartum care following cesarean delivery (2/15)   Maternal anemia, with delivery  - oral Fe and Mag ox started   Gestational HTN  - normotensive, no s/sx of PEC   GDM, class A2  - follow PP   Hypothyroidism  - stable on current regimen, continue   Doing well, stable.               Advance diet as tolerated, warm fluids for gut motility Encourage rest when baby  rests Breastfeeding support Encourage to ambulate Routine post-op care   Juliene Pina, CNM, MSN 11/25/2017, 10:14 AM

## 2017-11-25 NOTE — Clinical Social Work Note (Signed)
..  MOB was referred for history of anxiety.  Referral is screened out by Clinical Social Worker because none of the following criteria appear to apply and  there are no reports impacting the pregnancy or her transition to the postpartum period:    -History of anxiety/depression during this pregnancy, or of post-partum depression.  - Diagnosis of anxiety and/or depression within last 3 years.-   - History of depression due to pregnancy loss/loss of child  CSW does not deem it clinically necessary to further investigate at this time.    Please contact the Clinical Social Worker if needs arise or upon MOB request.

## 2017-11-25 NOTE — Addendum Note (Signed)
Addendum  created 11/25/17 1258 by Flossie Dibble, CRNA   Sign clinical note

## 2017-11-25 NOTE — Anesthesia Postprocedure Evaluation (Signed)
Anesthesia Post Note  Patient: Pamela [name]  Procedure(s) Performed: Repeat CESAREAN SECTION (N/A )     Patient location during evaluation: Mother Baby Anesthesia Type: Spinal Level of consciousness: awake and alert and oriented Pain management: satisfactory to patient Vital Signs Assessment: post-procedure vital signs reviewed and stable Respiratory status: respiratory function stable and spontaneous breathing Cardiovascular status: blood pressure returned to baseline Postop Assessment: no headache, no backache, spinal receding, patient able to bend at knees and adequate PO intake Anesthetic complications: no    Last Vitals:  Vitals:   11/25/17 0602 11/25/17 0900  BP: (!) 98/51 105/63  Pulse: (!) 51 60  Resp:  19  Temp:  36.9 C  SpO2: 97% 98%    Last Pain:  Vitals:   11/25/17 0900  TempSrc:   PainSc: 0-No pain   Pain Goal: Patients Stated Pain Goal: 4 (11/24/17 1128)               Javae Braaten

## 2017-11-25 NOTE — Progress Notes (Signed)
The following breastfeeding education and support was given to : 1. Putting baby skin to skin for more time than baby is swaddled;   2. Latching baby to breast using correct positioning (baby & mom) at the following times: 0800; 3. Hand expression at the following times: 0800; 4. Reinforced no use of artificial nipples or pacifiers until breastfeeding established and demonstrated hand expression into a spoon and how to feed baby formula using a curved tip syringe and finger); 5. Assisting baby to cue to breastfeed 8 or more times in 24 hours; 6. Intake, output, and weight change (recorded in flowsheets); 7. Education on breast milk substitutes included: 1. Lead to engorgement; 2. Lower your confidence in your ability to breastfeed; 3. Decrease your milk supply; 4. Increase risk of baby developing asthma and allergies. Ideas discussed on how we can support mom and baby in order to prepare for discharge included: Mom wanting to pump and breastfeed - and stated she may switch to only bottle upon discharge.  This RN set up DEBP and explained pump care and use, demonstrated syringe feeding, went through the handouts on formula feedings (including preparation of formula at home, amounts of supplementation at age ranges).

## 2017-11-25 NOTE — Progress Notes (Signed)
Patient asked for this RN to call CNM to verify it is OK to hold Labetalol if DBP<80 (verified by Derrell Lolling CNM) and that she will not be going home on Metformin (verified by Derrell Lolling CNM also).

## 2017-11-26 MED ORDER — MAGNESIUM OXIDE 400 (241.3 MG) MG PO TABS: 400.0000 mg | ORAL_TABLET | Freq: Every day | ORAL | Status: DC

## 2017-11-26 MED ORDER — OXYCODONE-ACETAMINOPHEN 5-325 MG PO TABS: 1.0000 | ORAL_TABLET | ORAL | 0 refills | Status: DC | PRN

## 2017-11-26 MED ORDER — SIMETHICONE 80 MG PO CHEW: 80.0000 mg | CHEWABLE_TABLET | Freq: Three times a day (TID) | ORAL | 0 refills | Status: DC

## 2017-11-26 MED ORDER — IBUPROFEN 600 MG PO TABS: 600.0000 mg | ORAL_TABLET | Freq: Four times a day (QID) | ORAL | 0 refills | Status: DC

## 2017-11-26 MED ORDER — SENNOSIDES-DOCUSATE SODIUM 8.6-50 MG PO TABS: 2.0000 | ORAL_TABLET | ORAL | Status: DC

## 2017-11-26 MED ORDER — COCONUT OIL OIL: 1.0000 "application " | TOPICAL_OIL | 0 refills | Status: DC | PRN

## 2017-11-26 MED ORDER — POLYSACCHARIDE IRON COMPLEX 150 MG PO CAPS: 150.0000 mg | ORAL_CAPSULE | Freq: Every day | ORAL | Status: DC

## 2017-11-26 NOTE — Discharge Summary (Signed)
OB Discharge Summary     Patient Name: Pamela [name] DOB: 02/07/1971 MRN: [id number]  Date of admission: 11/24/2017 Delivering MD: Brien Few   Date of discharge: 11/26/2017  Admitting diagnosis: Previous Cesarean Section, History of Myomectomy Intrauterine pregnancy: [redacted]w[redacted]d     Secondary diagnosis:  Principal Problem:   S/P cesarean section 2/15 Active Problems:   Postpartum care following cesarean delivery (2/15)   Maternal anemia, with delivery   Gestational HTN   GDM, class A2   Hypothyroidism      Discharge diagnosis: Term Pregnancy Delivered and Anemia                                    Complications: None  Hospital course:  Sceduled C/S   47 y.o. yo [id number] at [redacted]w[redacted]d was admitted to the hospital 11/24/2017 for scheduled cesarean section with the following indication:Elective Repeat.  Membrane Rupture Time/Date: 1:25 PM ,11/24/2017   Patient delivered a Viable infant.11/24/2017  Details of operation can be found in separate operative note.  Pateint had an uncomplicated postpartum course.  She is ambulating, tolerating a regular diet, passing flatus, and urinating well. Patient is discharged home in stable condition on  11/26/17         Physical exam  Vitals:   11/25/17 1330 11/25/17 1816 11/25/17 2200 11/26/17 0500  BP: 113/63 113/68 (!) 117/52 129/75  Pulse: 61 (!) 59 (!) 59 (!) 57  Resp: 18 17    Temp: 98.8 F (37.1 C) 98.5 F (36.9 C)  98.4 F (36.9 C)  TempSrc: Oral Oral  Oral  SpO2:      Weight:      Height:       General: alert, cooperative and no distress Lochia: appropriate Uterine Fundus: firm Incision: Dressing is clean, dry, and intact DVT Evaluation: No cords or calf tenderness. No significant calf/ankle edema. Labs: Lab Results  Component Value Date   WBC 14.6 (H) 11/25/2017   HGB 9.4 (L) 11/25/2017   HCT 26.9 (L) 11/25/2017   MCV 87.1 11/25/2017   PLT 193 11/25/2017   CMP Latest Ref Rng & Units 11/24/2017  Glucose 65 - 99 mg/dL  83  BUN 6 - 20 mg/dL 10  Creatinine 0.44 - 1.00 mg/dL 0.62  Sodium 135 - 145 mmol/L 135  Potassium 3.5 - 5.1 mmol/L 3.9  Chloride 101 - 111 mmol/L 105  CO2 22 - 32 mmol/L 19(L)  Calcium 8.9 - 10.3 mg/dL 7.8(L)  Total Protein 6.5 - 8.1 g/dL 6.7  Total Bilirubin 0.3 - 1.2 mg/dL 1.1  Alkaline Phos 38 - 126 U/L 178(H)  AST 15 - 41 U/L 29  ALT 14 - 54 U/L 27    Discharge instruction: per After Visit Summary and "Baby and Me Booklet".  After visit meds:  Allergies as of 11/26/2017      Reactions   Augmentin [amoxicillin-pot Clavulanate] Nausea And Vomiting   Erythromycin Nausea And Vomiting      Medication List    STOP taking these medications   aspirin EC 81 MG tablet   folic acid 299 MCG tablet Commonly known as:  FOLVITE   labetalol 100 MG tablet Commonly known as:  NORMODYNE   metFORMIN 500 MG tablet Commonly known as:  GLUCOPHAGE     TAKE these medications   acetaminophen 500 MG tablet Commonly known as:  TYLENOL Take 1,000 mg by mouth daily as needed for mild  pain or headache.   coconut oil Oil Apply 1 application topically as needed.   ibuprofen 600 MG tablet Commonly known as:  ADVIL,MOTRIN Take 1 tablet (600 mg total) by mouth every 6 (six) hours.   iron polysaccharides 150 MG capsule Commonly known as:  NIFEREX Take 1 capsule (150 mg total) by mouth daily.   magnesium oxide 400 (241.3 Mg) MG tablet Commonly known as:  MAG-OX Take 1 tablet (400 mg total) by mouth daily.   oxyCODONE-acetaminophen 5-325 MG tablet Commonly known as:  PERCOCET/ROXICET Take 1 tablet by mouth every 4 (four) hours as needed (pain scale 4-7).   PRENATAL + DHA PO Take 1 tablet by mouth daily.   ranitidine 150 MG tablet Commonly known as:  ZANTAC Take 150 mg by mouth daily as needed for heartburn.   senna-docusate 8.6-50 MG tablet Commonly known as:  Senokot-S Take 2 tablets by mouth daily. Start taking on:  11/27/2017   simethicone 80 MG chewable tablet Commonly  known as:  MYLICON Chew 1 tablet (80 mg total) by mouth 3 (three) times daily after meals.   SYNTHROID 125 MCG tablet Generic drug:  levothyroxine TAKE 1 TABLET BY MOUTH DAILY, BEFORE BREAKFAST, DOUBLE ON SATURDAY AND SUNDAY What changed:    See the new instructions.  Another medication with the same name was removed. Continue taking this medication, and follow the directions you see here.       Diet: routine diet  Activity: Advance as tolerated. Pelvic rest for 6 weeks.   Outpatient follow up:6 weeks Follow up Appt: Future Appointments  Date Time Provider [place]  02/14/2018 10:00 AM Philemon Kingdom, MD [facility] None   Follow up Visit:No Follow-up on file.  Postpartum contraception: Not Discussed  Newborn Data: Live born female Lorre Nick Birth Weight: 6 lb 7.5 oz (2935 g) APGAR: 30, 9  Newborn Delivery   Birth date/time:  11/24/2017 13:25:00 Delivery type:  C-Section, Low Transverse C-section categorization:  Repeat     Baby Feeding: Breast Disposition:home with mother   11/26/2017 Juliene Pina, CNM

## 2017-11-26 NOTE — Lactation Note (Signed)
This note was copied from a baby's chart. Lactation Consultation Note  Patient Name: Girl Morgan Keinath ZHGDJ'M Date: 11/26/2017 Reason for consult: Follow-up assessment;Early term 37-38.6wks;Other (Comment)(per mom my plan is to just pump and bottle feed )  Baby is 75 hours old.  LC reviewed doc flow sheets and noted the baby was switched from syringe feeding to bottle feeding at 1115 am .  LC reviewed supply and demand. Mom mentioned with her 1st baby she never made enough to get engorged.  Moms plans is to pump and bottle feed only.  LC reviewed engorgement prevention and tx if needed.  Per mom has a 2 DEBP at home.  Mother informed of post-discharge support and given phone number to the lactation department, including services for phone call assistance; out-patient appointments; and breastfeeding support group. List of other breastfeeding resources in the community given in the handout. Encouraged mother to call for problems or concerns related to breastfeeding.    Maternal Data    Feeding Feeding Type: Bottle Fed - Formula  LATCH Score                   Interventions Interventions: Breast feeding basics reviewed  Lactation Tools Discussed/Used     Consult Status Consult Status: Complete Date: 11/26/17 Follow-up type: In-patient    [place] 11/26/2017, 2:18 PM

## 2017-11-26 NOTE — Progress Notes (Signed)
Subjective: POD# 2 Information for the patient's newborn:  Dajae, [place] Girl Meleena [[id number]]  female  Baby name: Lorre Nick  Reports feeling well, ready for discharge home. Feeding: breast Patient reports tolerating PO.  Breast symptoms: difficulty latching, has been expressing and pumping. Pain controlled withibuprofen (OTC) Denies HA/SOB/C/P/N/V/dizziness. Flatus present. She reports vaginal bleeding as normal, without clots.  She is ambulating, urinating without difficulty.     Objective:   VS:    Vitals:   11/25/17 1330 11/25/17 1816 11/25/17 2200 11/26/17 0500  BP: 113/63 113/68 (!) 117/52 129/75  Pulse: 61 (!) 59 (!) 59 (!) 57  Resp: 18 17    Temp: 98.8 F (37.1 C) 98.5 F (36.9 C)  98.4 F (36.9 C)  TempSrc: Oral Oral  Oral  SpO2:      Weight:      Height:          Intake/Output Summary (Last 24 hours) at 11/26/2017 0932 Last data filed at 11/25/2017 1100 Gross per 24 hour  Intake 360 ml  Output 750 ml  Net -390 ml        Recent Labs    11/23/17 1035 11/25/17 0526  WBC 10.8* 14.6*  HGB 13.3 9.4*  HCT 38.7 26.9*  PLT 214 193     Blood type: --/--/O POS (02/16 0526)  Rubella: Immune (08/24 0000)     Physical Exam:  General: alert, cooperative and no distress Abdomen: soft, nontender, normal bowel sounds Incision: clean, dry and intact Uterine Fundus: firm, below umbilicus, nontender Lochia: minimal Ext: no edema, redness or tenderness in the calves or thighs      Assessment/Plan: 47 y.o.   POD# 2. [id number]                  Principal Problem:   S/P cesarean section 2/15 Active Problems:   Postpartum care following cesarean delivery (2/15)   Maternal anemia, with delivery             - oral Fe and Mag ox started   Gestational HTN             - normotensive, no s/sx of PEC       - check BP at home in 1 week and call with results    - PEC precautions.   GDM, class A2             - follow PP   Hypothyroidism             - stable on current  regimen, continue    Doing well, stable.    Routine post-op care             DC home today w/ instructions  F/U at [place] in 6 weeks and PRN    Juliene Pina, CNM, MSN 11/26/2017, 9:32 AM

## 2017-11-27 LAB — TYPE AND SCREEN
ABO/RH(D): O POS
Antibody Screen: NEGATIVE
Unit division: 0
Unit division: 0
Unit division: 0
Unit division: 0

## 2017-11-27 LAB — BPAM RBC
Blood Product Expiration Date: 201903112359
Blood Product Expiration Date: 201903112359
Blood Product Expiration Date: 201903112359
Blood Product Expiration Date: 201903122359
Unit Type and Rh: 5100
Unit Type and Rh: 5100
Unit Type and Rh: 5100
Unit Type and Rh: 5100

## 2017-11-29 ENCOUNTER — Telehealth: Payer: Self-pay | Admitting: Internal Medicine

## 2017-11-29 NOTE — Telephone Encounter (Signed)
Please advise on below  

## 2017-11-29 NOTE — Telephone Encounter (Signed)
She can return to the previous thyroid dose from before her pregnancy (without the extra levothyroxine per week) and return to have labs in 5-6 weeks.  Labs are in.

## 2017-11-29 NOTE — Telephone Encounter (Signed)
LMTCB

## 2017-11-29 NOTE — Telephone Encounter (Signed)
Pt stated that needs to find out what she needs to take  She was pregnant and was taking an extra tablet a day. She has now had the baby and would like to know what dosage she should take or if dr needs blood work done to check levels.      SYNTHROID 125 MCG tablet   Please advise

## 2017-11-29 NOTE — Telephone Encounter (Signed)
Pt is aware.  

## 2017-12-01 ENCOUNTER — Other Ambulatory Visit: Payer: Self-pay | Admitting: Internal Medicine

## 2017-12-04 ENCOUNTER — Other Ambulatory Visit: Payer: Self-pay

## 2017-12-08 ENCOUNTER — Telehealth: Payer: Self-pay

## 2017-12-08 NOTE — Telephone Encounter (Signed)
error 

## 2017-12-13 ENCOUNTER — Telehealth: Payer: Self-pay

## 2017-12-13 NOTE — Telephone Encounter (Signed)
Opened in error

## 2018-01-04 ENCOUNTER — Other Ambulatory Visit: Payer: Self-pay | Admitting: Internal Medicine

## 2018-01-04 ENCOUNTER — Other Ambulatory Visit (INDEPENDENT_AMBULATORY_CARE_PROVIDER_SITE_OTHER): Payer: BLUE CROSS/BLUE SHIELD

## 2018-01-04 DIAGNOSIS — E890 Postprocedural hypothyroidism: Secondary | ICD-10-CM | POA: Diagnosis not present

## 2018-01-04 LAB — TSH: TSH: 0.74 u[IU]/mL (ref 0.35–4.50)

## 2018-01-04 LAB — T4, FREE: Free T4: 1 ng/dL (ref 0.60–1.60)

## 2018-01-31 DIAGNOSIS — Z124 Encounter for screening for malignant neoplasm of cervix: Secondary | ICD-10-CM | POA: Diagnosis not present

## 2018-01-31 DIAGNOSIS — R87610 Atypical squamous cells of undetermined significance on cytologic smear of cervix (ASC-US): Secondary | ICD-10-CM | POA: Diagnosis not present

## 2018-02-05 ENCOUNTER — Other Ambulatory Visit: Payer: Self-pay | Admitting: Internal Medicine

## 2018-02-05 MED ORDER — SYNTHROID 125 MCG PO TABS
125.0000 ug | ORAL_TABLET | Freq: Every day | ORAL | 2 refills | Status: DC
Start: 2018-02-05 — End: 2018-02-15

## 2018-02-05 NOTE — Telephone Encounter (Signed)
SYNTHROID 125 MCG tablet  Patient would like a refill sent into the pharmacy She stated she would like 2 refills sent in .     [facility] [phone] - [place], [place] - [address] AT [place]

## 2018-02-05 NOTE — Addendum Note (Signed)
Addended by: Leia Alf on: 02/05/2018 03:50 PM   Modules accepted: Orders

## 2018-02-05 NOTE — Telephone Encounter (Signed)
Called pt and informed Rx sent.

## 2018-02-14 ENCOUNTER — Ambulatory Visit: Payer: BLUE CROSS/BLUE SHIELD | Admitting: Internal Medicine

## 2018-02-14 ENCOUNTER — Encounter: Payer: Self-pay | Admitting: Internal Medicine

## 2018-02-14 VITALS — BP 124/86 | HR 74 | Ht 68.0 in | Wt 195.6 lb

## 2018-02-14 DIAGNOSIS — R202 Paresthesia of skin: Secondary | ICD-10-CM

## 2018-02-14 DIAGNOSIS — C73 Malignant neoplasm of thyroid gland: Secondary | ICD-10-CM

## 2018-02-14 DIAGNOSIS — E559 Vitamin D deficiency, unspecified: Secondary | ICD-10-CM

## 2018-02-14 DIAGNOSIS — E890 Postprocedural hypothyroidism: Secondary | ICD-10-CM

## 2018-02-14 DIAGNOSIS — E892 Postprocedural hypoparathyroidism: Secondary | ICD-10-CM

## 2018-02-14 LAB — VITAMIN B12: Vitamin B-12: 401 pg/mL (ref 211–911)

## 2018-02-14 LAB — VITAMIN D 25 HYDROXY (VIT D DEFICIENCY, FRACTURES): VITD: 19.27 ng/mL — ABNORMAL LOW (ref 30.00–100.00)

## 2018-02-14 LAB — TSH: TSH: 0.29 u[IU]/mL — ABNORMAL LOW (ref 0.35–4.50)

## 2018-02-14 LAB — T4, FREE: Free T4: 1.12 ng/dL (ref 0.60–1.60)

## 2018-02-14 NOTE — Patient Instructions (Signed)
Please stop at the lab.  Please continue Synthroid 125 mcg daily.  Take the thyroid hormone every day, with water, at least 30 minutes before breakfast, separated by at least 4 hours from: - acid reflux medications - calcium - iron - multivitamins  Please return in 1 year.

## 2018-02-14 NOTE — Progress Notes (Signed)
Patient ID: [name], female   DOB: 1971-04-14, 47 y.o.   MRN: [id number]   HPI  Pamela [name] is a 47 y.o.-year-old female, initially referred by Dr. Dellis Filbert, returning for follow-up for h/o papillary thyroid cancer, now with postsurgical hypothyroidism and also postsurgical hypocalcemia. Last visit 1 year ago.  Last year she had IVF.  She gave birth to a healthy baby girl 11/2017.  She also has another 12 year old son.  During her pregnancy, we adjusted her levothyroxine dose up.  After she gave birth, we returned to the levothyroxine dose from before the pregnancy.  Reviewed and addended papillary thyroid cancer history:  She had an exam with ObGyn on 12/15/2014 >> Dr. Dellis Filbert felt a lump in neck.  Thyroid U/S (02/23/2015): Hypoechoic, solid right-sided isthmus nodule measures approximately 1.4 x 0.6 x 1.0 cm. There are a few scattered areas of peripheral calcification in this nodule.    FNA (02/25/2015): FINDINGS CONSISTENT WITH PAPILLARY CARCINOMA ([place] CATEGORY VI).  Thyroidectomy (03/12/2015): 1. Thyroid, thyroidectomy, total thyroid sutures right lobe - MULTIFOCAL PAPILLARY THYROID CARCINOMA, TWO FOCI MEASURING 1.1 CM AND 0.6 CM IN GREATEST DIMENSION. - MARGINS ARE NEGATIVE. - ONE BENIGN LYMPH NODE WITH NO TUMOR SEEN (0/1). - SEE ONCOLOGY TEMPLATE. ADDITIONAL FINDINGS: - INCIDENTAL BENIGN PARATHYROID TISSUE (0.3 CM). 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 2. Lymph nodes, regional resection, central compartment - THREE BENIGN LYMPH NODES WITH NO TUMOR SEEN (0/3). - INCIDENTAL BENIGN THYMIC TISSUE.  1. THYROID Specimen: Total thyroid with central compartment lymph nodes. Procedure: Total thyroidectomy with central compartment lymph node dissection. Specimen Integrity (intact/fragmented): Intact. Tumor focality: Multifocal, two foci.  Dominant tumor: Maximum tumor size (cm): 1.1 cm Tumor laterality: Isthmus (central). Histologic type (including subtype and/or  unique features as applicable): Papillary thyroid carcinoma, conventional type. Tumor capsule: No tumor capsule identified. Extrathyroidal extension: No. Margins: Negative. Lymph - Vascular invasion: Not identified. Capsular invasion with degree of invasion if present: Not applicable.  Second tumor: Tumor size(s): 0.6 cm Tumor laterality: Right superior. Histologic type (including subtype and/or unique features as applicable): Papillary thyroid carcinoma, conventional type. Tumor capsule: No tumor capsule identified. Extrathyroidal extension: No. Margins: Negative. Lymph - Vascular invasion: Not identified. Capsular invasion with degree of invasion if present: Not applicable. Lymph nodes: # examined 4; # positive; 0.  She did not have RAI treatment due to small sizes of the tumors.  04/11/2016: Neck U/S: No evidence of tissue in the right or left thyroid beds to suggest recurrence of residual thyroid tissue.  No evidence of abnormal adenopathy.  Pt denies: - feeling nodules in neck - hoarseness - dysphagia - choking - SOB with lying down  Her thyroglobulin levels are low and thyroglobulin antibodies are negative: Lab Results  Component Value Date   THYROGLB 0.2 (L) 02/14/2017   THYROGLB 0.1 (L) 03/17/2016   THYROGLB 0.2 (L) 09/18/2015   Lab Results  Component Value Date   THGAB <1 02/14/2017   THGAB <1 03/17/2016   THGAB <1 09/18/2015   I reviewed pt's thyroid tests - at goal, including after she gave birth: Lab Results  Component Value Date   TSH 0.74 01/04/2018   TSH 0.736 06/23/2017   TSH 0.127 (L) 05/23/2017   TSH 1.06 02/14/2017   TSH 0.90 03/17/2016   TSH 1.60 09/18/2015   TSH 0.48 07/30/2015   FREET4 1.00 01/04/2018   FREET4 1.42 06/23/2017   FREET4 1.97 (H) 05/23/2017   FREET4 1.07 02/14/2017   FREET4 1.04 03/17/2016  FREET4 0.91 09/18/2015   FREET4 1.24 07/30/2015  Received labs from Dr. Ronita Hipps, from 08/30/2017: - TSH 0.58, free T4 1.42, both at  goal  Pt is on levothyroxine 125 mcg daily, taken: - in am - fasting - at least 30 min from b'fast - no Ca, Fe, PPIs - she was on MVI at night >> stopped - not on Biotin  She had low calcium after the surgery >> resolved.  She  also has a history of HTN during previous pregnancy >> son born 10/2014.   She started to have tingling in feet in last month.  ROS: Constitutional: no weight gain/no weight loss, no fatigue, no subjective hyperthermia, no subjective hypothermia Eyes: no blurry vision, no xerophthalmia ENT: no sore throat, + see HPI Cardiovascular: no CP/no SOB/no palpitations/no leg swelling Respiratory: no cough/no SOB/no wheezing Gastrointestinal: no N/no V/no D/no C/no acid reflux Musculoskeletal: no muscle aches/+ joint aches Skin: no rashes, no hair loss Neurological: no tremors/no numbness/+ tingling/no dizziness  I reviewed pt's medications, allergies, PMH, social hx, family hx, and changes were documented in the history of present illness. Otherwise, unchanged from my initial visit note.  Past Medical History:  Diagnosis Date  . Complication of anesthesia    trouble waking up after second myomectomy  . Dermatitis   . Diabetes mellitus without complication ([place])   . Dysrhythmia    resolved- "grew out of it" - no problem since teenager  . Genital warts    removed  . Gestational diabetes   . Hypertension 2016   [place] with 2016 pregnancy / no BP meds since birth of child  . Lichen sclerosus   . Newborn product of in vitro fertilization (IVF) pregnancy   . Pregnancy induced hypertension   . Termination of pregnancy (fetus)    x 1  . Thyroid cancer [facility])    Past Surgical History:  Procedure Laterality Date  . BLADDER SURGERY  1992   Widened the urethra  . CESAREAN SECTION N/A 11/04/2014   Procedure: CESAREAN SECTION;  Surgeon: [name], MD;  Location: [facility];  Service: Obstetrics;  Laterality: N/A;  EDD: 11/23/14  . CESAREAN SECTION N/A 11/24/2017     Procedure: Repeat CESAREAN SECTION;  Surgeon: Brien Few, MD;  Location: [place];  Service: Obstetrics;  Laterality: N/A;  EDD: 12/14/17 Allergy: Erythromycin, Augmentin  . LYMPH NODE DISSECTION  03/12/2015   Procedure: LYMPH NODE DISSECTION;  Surgeon: Armandina Gemma, MD;  Location: [facility];  Service: General;;  . myomectomy  2008  . MYOMECTOMY  2014   Removed tubes bilaterally  . MYOMECTOMY N/A 11/04/2014   Procedure: MYOMECTOMY;  Surgeon: [name], MD;  Location: [facility];  Service: Obstetrics;  Laterality: N/A;  . THYROIDECTOMY N/A 03/12/2015   Procedure: TOTAL THYROIDECTOMY;  Surgeon: Armandina Gemma, MD;  Location: [facility];  Service: General;  Laterality: N/A;  . TUBAL LIGATION    . WISDOM TOOTH EXTRACTION     History   Social History  . Marital Status: Married    Spouse Name: N/A  . Number of Children: 1   Occupational History  .  accountant    Social History Main Topics  . Smoking status: Former Smoker -- 1.00 packs/day for 20 years    Types: Cigarettes    Quit date: 08/11/2007  . Smokeless tobacco: Never Used  . Alcohol Use: Yes     Comment: Beer/liquor 1 drink once a week   . Drug Use: Yes    Special: Marijuana, Cocaine  Comment: 25 years ago "Acid"  , last use cocaine/marijuana 25 yrs ago   Current Outpatient Medications on File Prior to Visit  Medication Sig Dispense Refill  . acetaminophen (TYLENOL) 500 MG tablet Take 1,000 mg by mouth daily as needed for mild pain or headache.    . coconut oil OIL Apply 1 application topically as needed.  0  . ibuprofen (ADVIL,MOTRIN) 600 MG tablet Take 1 tablet (600 mg total) by mouth every 6 (six) hours. 30 tablet 0  . iron polysaccharides (NIFEREX) 150 MG capsule Take 1 capsule (150 mg total) by mouth daily.    . magnesium oxide (MAG-OX) 400 (241.3 Mg) MG tablet Take 1 tablet (400 mg total) by mouth daily.    Marland Kitchen oxyCODONE-acetaminophen (PERCOCET/ROXICET) 5-325 MG tablet Take 1 tablet by mouth every 4 (four) hours  as needed (pain scale 4-7). 30 tablet 0  . Prenatal-FeFum-FA-DHA w/o A (PRENATAL + DHA PO) Take 1 tablet by mouth daily.    . ranitidine (ZANTAC) 150 MG tablet Take 150 mg by mouth daily as needed for heartburn.    . senna-docusate (SENOKOT-S) 8.6-50 MG tablet Take 2 tablets by mouth daily.    . simethicone (MYLICON) 80 MG chewable tablet Chew 1 tablet (80 mg total) by mouth 3 (three) times daily after meals. 30 tablet 0  . SYNTHROID 125 MCG tablet Take 1 tablet (125 mcg total) by mouth daily before breakfast. 30 tablet 2   No current facility-administered medications on file prior to visit.    Allergies  Allergen Reactions  . Augmentin [Amoxicillin-Pot Clavulanate] Nausea And Vomiting  . Erythromycin Nausea And Vomiting   No family history available. Patient is adopted.  She has lichen sclerosus >> on Clobetasol.  PE: BP 124/86   Pulse 74   Ht '5\' 8"'  (1.727 m)   Wt 195 lb 9.6 oz (88.7 kg)   SpO2 97%   BMI 29.74 kg/m  Body mass index is 29.74 kg/m. Wt Readings from Last 3 Encounters:  02/14/18 195 lb 9.6 oz (88.7 kg)  11/24/17 204 lb (92.5 kg)  11/09/17 213 lb (96.6 kg)   Constitutional: overweight, in NAD Eyes: PERRLA, EOMI, no exophthalmos ENT: moist mucous membranes, thyroidectomy scar healed, no cervical lymphadenopathy Cardiovascular: RRR, No MRG Respiratory: CTA B Gastrointestinal: abdomen soft, NT, ND, BS+ Musculoskeletal: no deformities, strength intact in all 4 Skin: moist, warm, no rashes Neurological: no tremor with outstretched hands, DTR normal in all 4  ASSESSMENT: 1. Bifocal papillary thyroid cancer  2. Postsurgical hypothyroidism  3.  Tingling in both feet  PLAN: 1. Papillary thyroid cancer -Thyroidectomy scar perfectly healed, without discomfort -Reviewed papillary thyroid cancer features: Not encapsulated Multifocal, but the second focus was a micro-PTC No extension into the lymph or blood vessels Margins free of disease Latest neck ultrasound  from 2017 shows no recurrence or metastasis in the neck Overall, she is stage I PTC, which is very good prognosis and RAI treatment is not necessary -We will repeat Tg + ATA today >> hopefully this will remain very low or undetectable - RTC in 1 year  2. Postsurgical hypothyroidism - latest thyroid labs reviewed with pt >> normal 12/2017, after her pregnancy - she continues on LT4 125 mcg daily - pt feels good on this dose. - we discussed about taking the thyroid hormone every day, with water, >30 minutes before breakfast, separated by >4 hours from acid reflux medications, calcium, iron, multivitamins. Pt. is taking it correctly. - will check thyroid tests today: TSH and  fT4 - If labs are abnormal, she will need to return for repeat TFTs in 1.5 months  3.  Tingling in both feet - Especially when she lays down at night - We will check a vitamin B12  - we will also check a calcium level since she had hypocalcemia in the past after the surgery - I will add a vitamin D and a PTH level  Component     Latest Ref Rng & Units 02/14/2018  Calcium     8.7 - 10.2 mg/dL 7.9 (L)  PTH, Intact     15 - 65 pg/mL 13 (L)  PTH Interp      Comment  Thyroglobulin     ng/mL 0.2 (L)  Comment        T4,Free(Direct)     0.60 - 1.60 ng/dL 1.12  TSH     0.35 - 4.50 uIU/mL 0.29 (L)  Thyroglobulin Ab     < or = 1 IU/mL <1  VITD     30.00 - 100.00 ng/mL 19.27 (L)  Vitamin B12     211 - 911 pg/mL 401    Low calcium, vitamin D and PTH. Likely postop hypoparathyroidism. Will start vitamin D 5000 units daily and calcium 500 mg with dinner. Recheck calcium and vitamin D in 1.5 months. Tg stable., low. TSH slightly low >> decrease LT4 dose to 112 mcg daily and recheck in 1.5 mo.  Needs 90 days LT4.  Philemon Kingdom, MD PhD [facility] Endocrin[facility]

## 2018-02-15 LAB — PTH, INTACT AND CALCIUM
Calcium: 7.9 mg/dL — ABNORMAL LOW (ref 8.7–10.2)
PTH: 13 pg/mL — ABNORMAL LOW (ref 15–65)

## 2018-02-15 LAB — THYROGLOBULIN ANTIBODY: Thyroglobulin Ab: <1 IU/mL (ref <=1)

## 2018-02-15 LAB — THYROGLOBULIN LEVEL: Thyroglobulin: 0.2 ng/mL — ABNORMAL LOW

## 2018-02-15 MED ORDER — SYNTHROID 112 MCG PO TABS: 112.0000 ug | ORAL_TABLET | Freq: Every day | ORAL | 5 refills | Status: DC

## 2018-02-28 DIAGNOSIS — M79641 Pain in right hand: Secondary | ICD-10-CM | POA: Diagnosis not present

## 2018-02-28 DIAGNOSIS — M79642 Pain in left hand: Secondary | ICD-10-CM | POA: Diagnosis not present

## 2018-03-13 ENCOUNTER — Encounter: Payer: Self-pay | Admitting: Internal Medicine

## 2018-03-14 ENCOUNTER — Telehealth: Payer: Self-pay

## 2018-03-14 DIAGNOSIS — C73 Malignant neoplasm of thyroid gland: Secondary | ICD-10-CM

## 2018-03-14 DIAGNOSIS — E890 Postprocedural hypothyroidism: Secondary | ICD-10-CM

## 2018-03-14 NOTE — Telephone Encounter (Signed)
-----   Message from Philemon Kingdom, MD sent at 03/14/2018 12:17 PM EDT ----- C, This patient needs an order for a TSH and a free T4 for approximately a month from now to be sent to her home.  She wants to have the labs at [facility] in [place].  Can you please order these, print them for her and mail them to her? Thank you so much! C

## 2018-03-14 NOTE — Telephone Encounter (Signed)
Orders entered, printed, and placed up front to be mailed for pt.

## 2018-03-15 DIAGNOSIS — M67449 Ganglion, unspecified hand: Secondary | ICD-10-CM | POA: Diagnosis not present

## 2018-03-15 DIAGNOSIS — M151 Heberden's nodes (with arthropathy): Secondary | ICD-10-CM | POA: Diagnosis not present

## 2018-03-15 DIAGNOSIS — M1990 Unspecified osteoarthritis, unspecified site: Secondary | ICD-10-CM | POA: Diagnosis not present

## 2018-03-15 DIAGNOSIS — M152 Bouchard's nodes (with arthropathy): Secondary | ICD-10-CM | POA: Diagnosis not present

## 2018-03-29 ENCOUNTER — Other Ambulatory Visit: Payer: Self-pay | Admitting: Internal Medicine

## 2018-03-29 DIAGNOSIS — E890 Postprocedural hypothyroidism: Secondary | ICD-10-CM | POA: Diagnosis not present

## 2018-03-29 DIAGNOSIS — E892 Postprocedural hypoparathyroidism: Secondary | ICD-10-CM | POA: Diagnosis not present

## 2018-03-29 DIAGNOSIS — E559 Vitamin D deficiency, unspecified: Secondary | ICD-10-CM | POA: Diagnosis not present

## 2018-03-29 DIAGNOSIS — C73 Malignant neoplasm of thyroid gland: Secondary | ICD-10-CM | POA: Diagnosis not present

## 2018-03-30 ENCOUNTER — Other Ambulatory Visit: Payer: Self-pay

## 2018-03-30 ENCOUNTER — Encounter: Payer: Self-pay | Admitting: Internal Medicine

## 2018-03-30 LAB — T4, FREE: Free T4: 1.35 ng/dL (ref 0.82–1.77)

## 2018-03-30 LAB — TSH: TSH: 1.75 u[IU]/mL (ref 0.450–4.500)

## 2018-04-03 ENCOUNTER — Other Ambulatory Visit: Payer: Self-pay

## 2018-04-04 DIAGNOSIS — N939 Abnormal uterine and vaginal bleeding, unspecified: Secondary | ICD-10-CM | POA: Diagnosis not present

## 2018-04-04 DIAGNOSIS — M2550 Pain in unspecified joint: Secondary | ICD-10-CM | POA: Diagnosis not present

## 2018-04-04 DIAGNOSIS — R87615 Unsatisfactory cytologic smear of cervix: Secondary | ICD-10-CM | POA: Diagnosis not present

## 2018-04-06 LAB — SPECIMEN STATUS REPORT

## 2018-04-09 LAB — VITAMIN D 25 HYDROXY (VIT D DEFICIENCY, FRACTURES): Vit D, 25-Hydroxy: 46.4 ng/mL (ref 30.0–100.0)

## 2018-04-09 LAB — CALCIUM: Calcium: 8.9 mg/dL (ref 8.7–10.2)

## 2018-04-09 LAB — SPECIMEN STATUS REPORT

## 2018-04-20 DIAGNOSIS — M67449 Ganglion, unspecified hand: Secondary | ICD-10-CM | POA: Diagnosis not present

## 2018-04-20 DIAGNOSIS — M791 Myalgia, unspecified site: Secondary | ICD-10-CM | POA: Diagnosis not present

## 2018-04-20 DIAGNOSIS — M79641 Pain in right hand: Secondary | ICD-10-CM | POA: Diagnosis not present

## 2018-04-20 DIAGNOSIS — M79642 Pain in left hand: Secondary | ICD-10-CM | POA: Diagnosis not present

## 2018-05-17 DIAGNOSIS — M19042 Primary osteoarthritis, left hand: Secondary | ICD-10-CM | POA: Diagnosis not present

## 2018-05-17 DIAGNOSIS — M67441 Ganglion, right hand: Secondary | ICD-10-CM | POA: Diagnosis not present

## 2018-05-17 DIAGNOSIS — M19041 Primary osteoarthritis, right hand: Secondary | ICD-10-CM | POA: Diagnosis not present

## 2018-05-24 DIAGNOSIS — M791 Myalgia, unspecified site: Secondary | ICD-10-CM | POA: Diagnosis not present

## 2018-08-10 DIAGNOSIS — S92515A Nondisplaced fracture of proximal phalanx of left lesser toe(s), initial encounter for closed fracture: Secondary | ICD-10-CM | POA: Diagnosis not present

## 2018-09-09 DIAGNOSIS — J019 Acute sinusitis, unspecified: Secondary | ICD-10-CM | POA: Diagnosis not present

## 2018-09-09 DIAGNOSIS — J40 Bronchitis, not specified as acute or chronic: Secondary | ICD-10-CM | POA: Diagnosis not present

## 2018-11-07 ENCOUNTER — Other Ambulatory Visit: Payer: Self-pay | Admitting: Internal Medicine

## 2018-11-22 DIAGNOSIS — Z1239 Encounter for other screening for malignant neoplasm of breast: Secondary | ICD-10-CM | POA: Diagnosis not present

## 2018-11-22 DIAGNOSIS — Z1231 Encounter for screening mammogram for malignant neoplasm of breast: Secondary | ICD-10-CM | POA: Diagnosis not present

## 2019-02-15 ENCOUNTER — Ambulatory Visit (INDEPENDENT_AMBULATORY_CARE_PROVIDER_SITE_OTHER): Payer: BLUE CROSS/BLUE SHIELD | Admitting: Internal Medicine

## 2019-02-15 ENCOUNTER — Encounter: Payer: Self-pay | Admitting: Internal Medicine

## 2019-02-15 DIAGNOSIS — E892 Postprocedural hypoparathyroidism: Secondary | ICD-10-CM | POA: Diagnosis not present

## 2019-02-15 DIAGNOSIS — E890 Postprocedural hypothyroidism: Secondary | ICD-10-CM

## 2019-02-15 DIAGNOSIS — C73 Malignant neoplasm of thyroid gland: Secondary | ICD-10-CM

## 2019-02-15 DIAGNOSIS — E559 Vitamin D deficiency, unspecified: Secondary | ICD-10-CM

## 2019-02-15 NOTE — Progress Notes (Signed)
Patient ID: Pamela [name], female   DOB: [birth year], 48 y.o.   MRN: [id number]  Patient location: Home My location: Office  Referring Provider: Dr. Dellis Filbert  I connected with the patient on 02/15/19 at 9:51 AM EDT by a video enabled telemedicine application and verified that I am speaking with the correct person.   I discussed the limitations of evaluation and management by telemedicine and the availability of in person appointments. The patient expressed understanding and agreed to proceed.   Details of the encounter are shown below.  HPI  Pamela [name] is a 48 y.o.-year-old female, initially referred by Dr. Dellis Filbert, presenting for follow-up for h/o papillary thyroid cancer,postsurgical hypothyroidism and also postsurgical hypoparathyroidism. Last visit 1 year ago.  She had IVF in 2018 >> healthy baby girl born 11/2017.  She also has a 76-year-old son.  Reviewed her papillary thyroid cancer history:  She had an exam with ObGyn on 12/15/2014 >> Dr. Dellis Filbert felt a lump in neck.  Thyroid U/S (02/23/2015): Hypoechoic, solid right-sided isthmus nodule measures approximately 1.4 x 0.6 x 1.0 cm. There are a few scattered areas of peripheral calcification in this nodule.    FNA (02/25/2015): FINDINGS CONSISTENT WITH PAPILLARY CARCINOMA ([place] CATEGORY VI).  Thyroidectomy (03/12/2015): 1. Thyroid, thyroidectomy, total thyroid sutures right lobe - MULTIFOCAL PAPILLARY THYROID CARCINOMA, TWO FOCI MEASURING 1.1 CM AND 0.6 CM IN GREATEST DIMENSION. - MARGINS ARE NEGATIVE. - ONE BENIGN LYMPH NODE WITH NO TUMOR SEEN (0/1). - SEE ONCOLOGY TEMPLATE. ADDITIONAL FINDINGS: - INCIDENTAL BENIGN PARATHYROID TISSUE (0.3 CM). 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 2. Lymph nodes, regional resection, central compartment - THREE BENIGN LYMPH NODES WITH NO TUMOR SEEN (0/3). - INCIDENTAL BENIGN THYMIC TISSUE.  1. THYROID Specimen: Total thyroid with central compartment lymph nodes. Procedure: Total  thyroidectomy with central compartment lymph node dissection. Specimen Integrity (intact/fragmented): Intact. Tumor focality: Multifocal, two foci.  Dominant tumor: Maximum tumor size (cm): 1.1 cm Tumor laterality: Isthmus (central). Histologic type (including subtype and/or unique features as applicable): Papillary thyroid carcinoma, conventional type. Tumor capsule: No tumor capsule identified. Extrathyroidal extension: No. Margins: Negative. Lymph - Vascular invasion: Not identified. Capsular invasion with degree of invasion if present: Not applicable.  Second tumor: Tumor size(s): 0.6 cm Tumor laterality: Right superior. Histologic type (including subtype and/or unique features as applicable): Papillary thyroid carcinoma, conventional type. Tumor capsule: No tumor capsule identified. Extrathyroidal extension: No. Margins: Negative. Lymph - Vascular invasion: Not identified. Capsular invasion with degree of invasion if present: Not applicable. Lymph nodes: # examined 4; # positive; 0.  She did not have RAI treatment due to small sizes of the tumors.  04/11/2016: Neck U/S:  No evidence of tissue in the right or left thyroid beds to suggest recurrence of residual thyroid tissue.  No evidence of abnormal adenopathy.  Her thyroglobulin levels are low and thyroglobulin antibodies are not elevated: Lab Results  Component Value Date   THYROGLB 0.2 (L) 02/14/2018   THYROGLB 0.2 (L) 02/14/2017   THYROGLB 0.1 (L) 03/17/2016   THYROGLB 0.2 (L) 09/18/2015   Lab Results  Component Value Date   THGAB <1 02/14/2018   THGAB <1 02/14/2017   THGAB <1 03/17/2016   THGAB <1 09/18/2015   Pt denies: - feeling nodules in neck - hoarseness - dysphagia - choking - SOB with lying down  Reviewed latest TFTs: Lab Results  Component Value Date   TSH 1.750 03/29/2018   TSH 0.29 (L) 02/14/2018   TSH 0.74 01/04/2018   TSH 0.736 06/23/2017  TSH 0.127 (L) 05/23/2017   TSH 1.06  02/14/2017   TSH 0.90 03/17/2016   FREET4 1.35 03/29/2018   FREET4 1.12 02/14/2018   FREET4 1.00 01/04/2018   FREET4 1.42 06/23/2017   FREET4 1.97 (H) 05/23/2017   FREET4 1.07 02/14/2017   FREET4 1.04 03/17/2016  Received labs from Dr. Ronita Hipps, from 08/30/2017: - TSH 0.58, free T4 1.42, both at goal  Pt is on levothyroxine 112 mcg daily decreased at last visit), taken: - in am - fasting - at least 30 min from b'fast - no Ca, Fe, MVI, PPIs - not on Biotin  She  also has a history of HTN during previous pregnancy >> son born 10/2014.   At last visit, she complained about tingling in her feet.  Vitamin B12 was normal: Lab Results  Component Value Date   VITAMINB12 401 02/14/2018   However, her PTH, calcium, and vitamin D were all low.  We started: -Calcium 500 mg with dinner >> now stopped, only taking as needed, 1-2x a mo -Vitamin D 5000 units daily >> now dropped to 2500 units daily  Now occasionally tingling in hands >> takes an extra calcium tablet which resolves her symptoms.  On the original doses of the supplements, her levels normalized: Lab Results  Component Value Date   PTH 13 (L) 02/14/2018   PTH Comment 02/14/2018   CALCIUM 8.9 03/29/2018   CALCIUM 7.9 (L) 02/14/2018   CALCIUM 7.8 (L) 11/24/2017   CALCIUM 8.7 04/17/2015   CALCIUM 8.3 (L) 03/31/2015   CALCIUM 8.4 (L) 03/29/2015   CALCIUM 8.0 (L) 03/20/2015   CALCIUM 8.6 (L) 03/13/2015   CALCIUM 9.2 03/11/2015   CALCIUM 8.6 11/06/2014   Lab Results  Component Value Date   VD25OH 46.4 03/29/2018   VD25OH 19.27 (L) 02/14/2018    ROS: Constitutional: no weight gain/no weight loss, no fatigue, no subjective hyperthermia, no subjective hypothermia Eyes: no blurry vision, no xerophthalmia ENT: no sore throat,  + see HPI Cardiovascular: no CP/no SOB/no palpitations/no leg swelling Respiratory: no cough/no SOB/no wheezing Gastrointestinal: no N/no V/no D/no C/no acid reflux Musculoskeletal: + muscle  aches/+ joint aches Skin: no rashes, no hair loss Neurological: no tremors/no numbness/no tingling/no dizziness  I reviewed pt's medications, allergies, PMH, social hx, family hx, and changes were documented in the history of present illness. Otherwise, unchanged from my initial visit note.  Past Medical History:  Diagnosis Date  . Complication of anesthesia    trouble waking up after second myomectomy  . Dermatitis   . Diabetes mellitus without complication ([place])   . Dysrhythmia    resolved- "grew out of it" - no problem since teenager  . Genital warts    removed  . Gestational diabetes   . Hypertension 2016   [place] with 2016 pregnancy / no BP meds since birth of child  . Lichen sclerosus   . Newborn product of in vitro fertilization (IVF) pregnancy   . Pregnancy induced hypertension   . Termination of pregnancy (fetus)    x 1  . Thyroid cancer [facility])    Past Surgical History:  Procedure Laterality Date  . BLADDER SURGERY  1992   Widened the urethra  . CESAREAN SECTION N/A 11/04/2014   Procedure: CESAREAN SECTION;  Surgeon: [name], MD;  Location: [facility];  Service: Obstetrics;  Laterality: N/A;  EDD: 11/23/14  . CESAREAN SECTION N/A 11/24/2017   Procedure: Repeat CESAREAN SECTION;  Surgeon: Brien Few, MD;  Location: [facility];  Service: Obstetrics;  Laterality: N/A;  EDD: 12/14/17 Allergy: Erythromycin, Augmentin  . LYMPH NODE DISSECTION  03/12/2015   Procedure: LYMPH NODE DISSECTION;  Surgeon: Armandina Gemma, MD;  Location: [facility];  Service: General;;  . myomectomy  2008  . MYOMECTOMY  2014   Removed tubes bilaterally  . MYOMECTOMY N/A 11/04/2014   Procedure: MYOMECTOMY;  Surgeon: [name], MD;  Location: [facility];  Service: Obstetrics;  Laterality: N/A;  . THYROIDECTOMY N/A 03/12/2015   Procedure: TOTAL THYROIDECTOMY;  Surgeon: Armandina Gemma, MD;  Location: [facility];  Service: General;  Laterality: N/A;  . TUBAL LIGATION    . WISDOM TOOTH EXTRACTION      History   Social History  . Marital Status: Married    Spouse Name: N/A  . Number of Children: 1   Occupational History  .  accountant    Social History Main Topics  . Smoking status: Former Smoker -- 1.00 packs/day for 20 years    Types: Cigarettes    Quit date: 08/11/2007  . Smokeless tobacco: Never Used  . Alcohol Use: Yes     Comment: Beer/liquor 1 drink once a week   . Drug Use: Yes    Special: Marijuana, Cocaine     Comment: 25 years ago "Acid"  , last use cocaine/marijuana 25 yrs ago   Current Outpatient Medications on File Prior to Visit  Medication Sig Dispense Refill  . acetaminophen (TYLENOL) 500 MG tablet Take 1,000 mg by mouth daily as needed for mild pain or headache.     Marland Kitchen SYNTHROID 112 MCG tablet TAKE 1 TABLET(112 MCG) BY MOUTH DAILY BEFORE BREAKFAST 45 tablet 5   No current facility-administered medications on file prior to visit.    Allergies  Allergen Reactions  . Augmentin [Amoxicillin-Pot Clavulanate] Nausea And Vomiting  . Erythromycin Nausea And Vomiting   No family history available. Patient is adopted.  She has lichen sclerosus >> on Clobetasol.  PE: There were no vitals taken for this visit. There is no height or weight on file to calculate BMI. Wt Readings from Last 3 Encounters:  02/14/18 195 lb 9.6 oz (88.7 kg)  11/24/17 204 lb (92.5 kg)  11/09/17 213 lb (96.6 kg)   Constitutional:  in NAD  The physical exam was not performed (virtual visit).  ASSESSMENT: 1. Bifocal papillary thyroid cancer  2. Postsurgical hypothyroidism  3.  Postsurgical hypoparathyroidism  4.  Vitamin D deficiency  PLAN: 1. Papillary thyroid cancer -Her thyroidectomy scar is healed without any discomfort or keloid formation -Reviewed papillary thyroid cancer features: Not encapsulated Multifocal, but the second focus was a micro-PTC No extension into the lymph or blood vessels Margins free of disease Latest neck ultrasound from 2017 shows no recurrence  or metastasis in the neck Overall, she is stage I PTC, which is very good prognosis so I did not recommend RAI treatment -Reviewed latest thyroglobulin level which was low (her levels are between 0.1 and 0.2) -We will repeat her thyroglobulin and ATA when at lab work when safe (now coronavirus pandemic) -I will see her back in a year but will repeat labs sooner  2. Postsurgical hypothyroidism - latest thyroid labs reviewed with pt >> normal 03/2018, after decreasing her levothyroxine dose - she continues on LT4 112 mcg daily - pt feels good on this dose except for generalized aches and pains, relieved by calcium - we discussed about taking the thyroid hormone every day, with water, >30 minutes before breakfast, separated by >4 hours from acid reflux medications, calcium,  iron, multivitamins. Pt. is taking it correctly. - will check thyroid tests at [facility]: TSH and fT4 - If labs are abnormal, she will need to return for repeat TFTs in 1.5 months  3.  Postsurgical hypoparathyroidism and 4. vitamin D deficiency -At last visit, she was complaining about tingling in her feet and a calcium, PTH, and vitamin D were all low.  We started her on replacement with vitamin D 5000 units daily and calcium 500 mg with dinner. -Subsequent calcium and vitamin D levels have been normal 03/2019, however, she tells me that she decrease the dose of vitamin D since then to 2500 units daily and she stopped the calcium with dinner.  She takes calcium only occasionally, when she has been eating in her hands.  Calcium resolves the tingling. -At this visit, she also complains of generalized aches and pains and we discussed about restarting calcium with dinner and -We will recheck these levels at [place] This Encounter  Procedures  . PTH, intact and calcium  . VITAMIN D 25 Hydroxy (Vit-D Deficiency, Fractures)  . TSH  . T4, free  . Thyroglobulin Level  . Thyroglobulin antibody    Philemon Kingdom,  MD PhD [facility]

## 2019-02-15 NOTE — Patient Instructions (Signed)
Please continue: - vitamin D 2500 units daily  Start: - calcium 500 mg daily with dinner  Come back for labs when safe.  Please come back for a follow-up appointment in 1 year.

## 2019-04-23 IMAGING — US US MFM OB DETAIL+14 WK
1 series · 14 of 28 positions shown · non-contrast
Comparison: none

[Series 1: us mfm ob detail+14 wk · 14 of 76 slices shown]
[im 3/76]
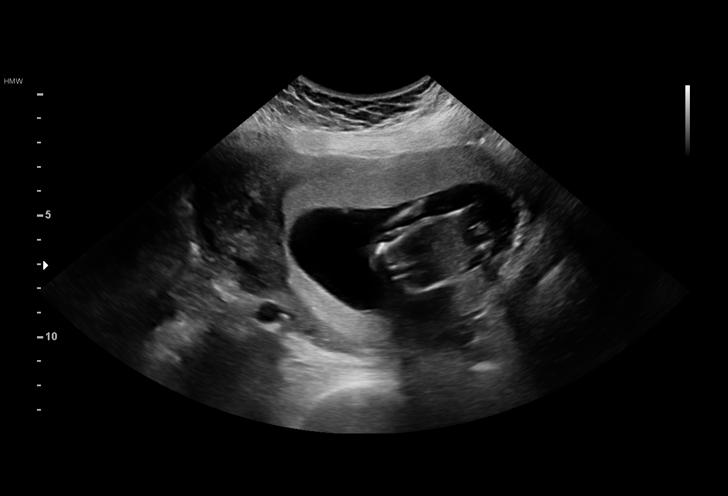
[im 9/76]
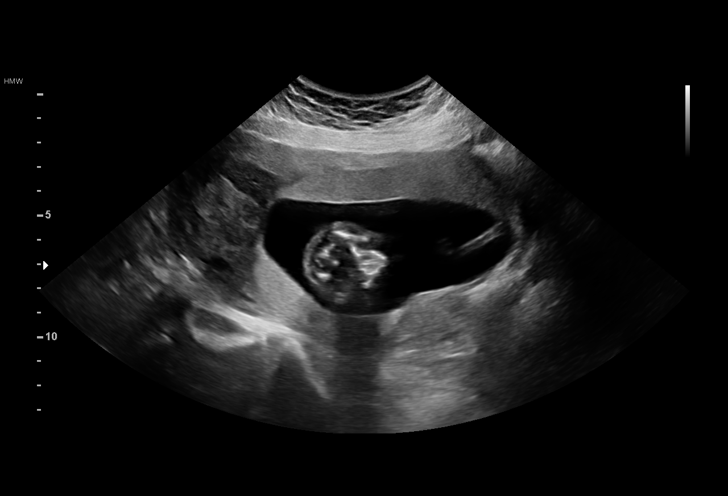
[im 14/76]
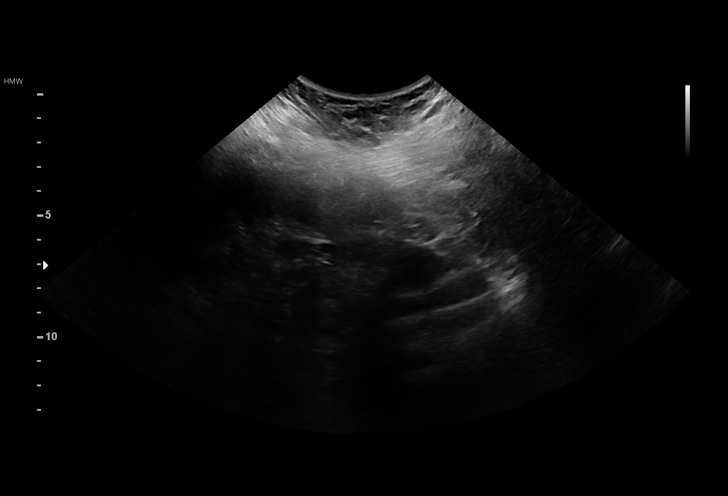
[im 20/76]
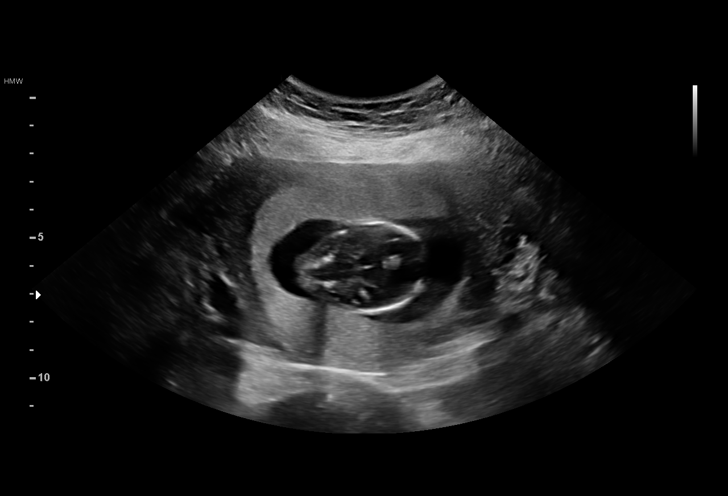
[im 26/76]
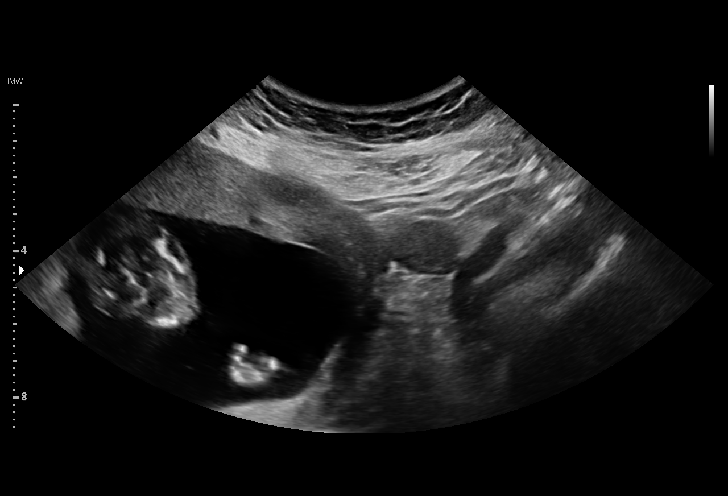
[im 31/76]
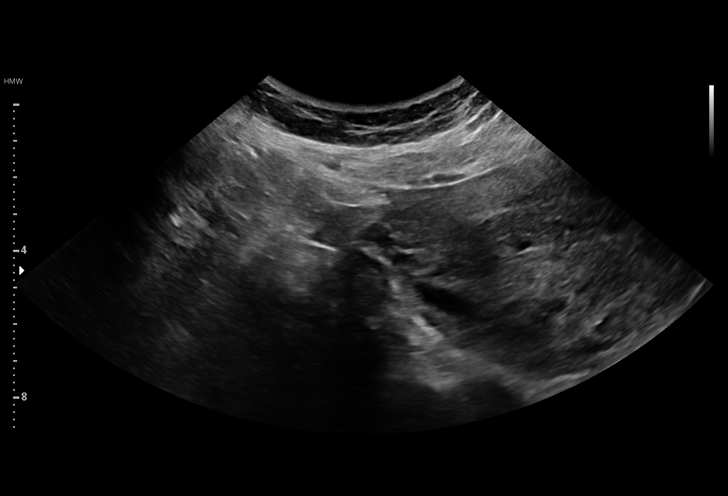
[im 37/76]
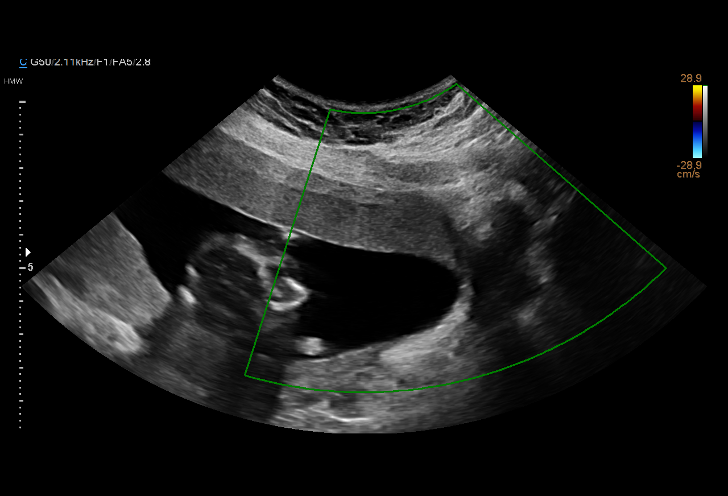
[im 42/76]
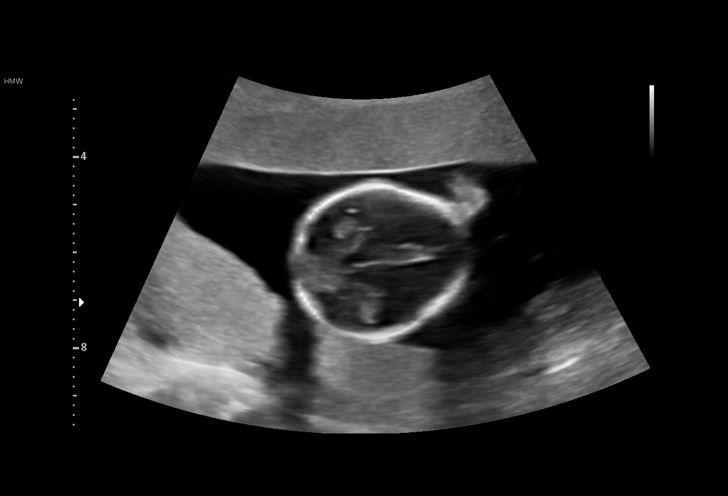
[im 48/76]
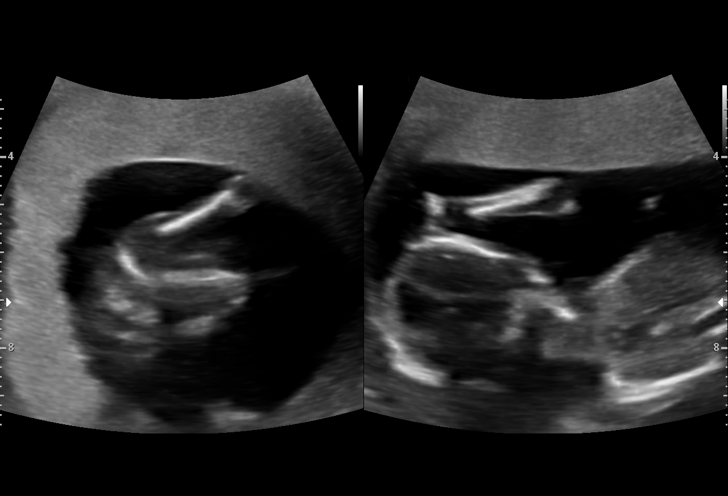
[im 53/76]
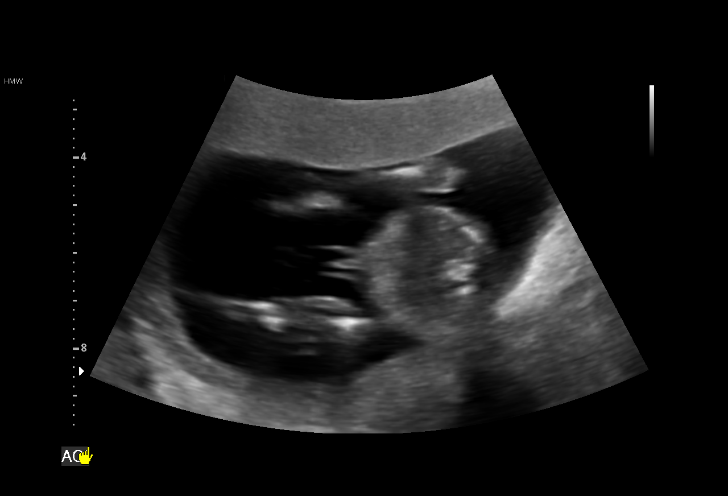
[im 59/76]
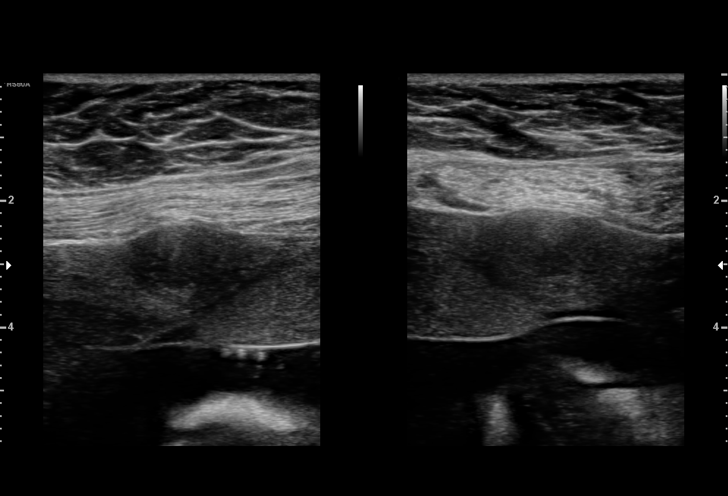
[im 64/76]
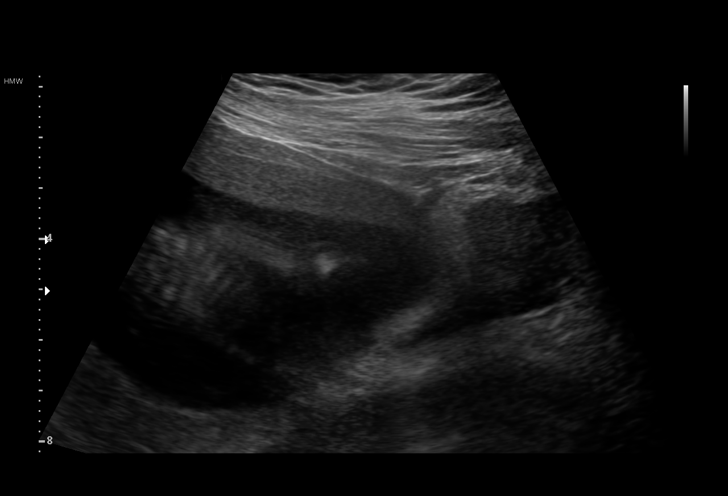
[im 70/76]
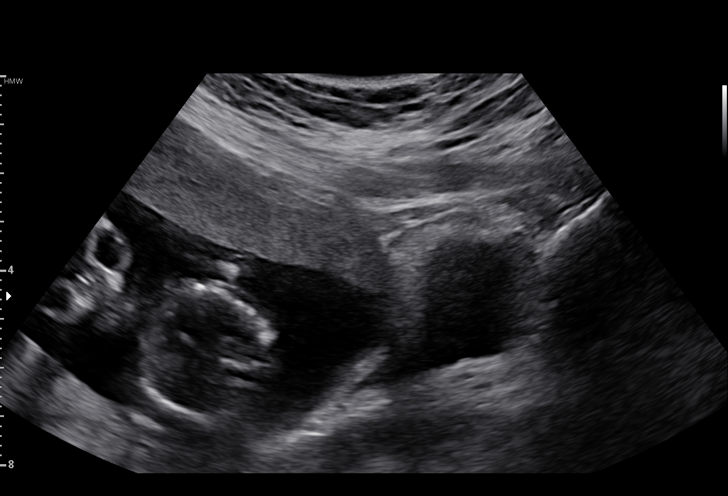
[im 76/76]
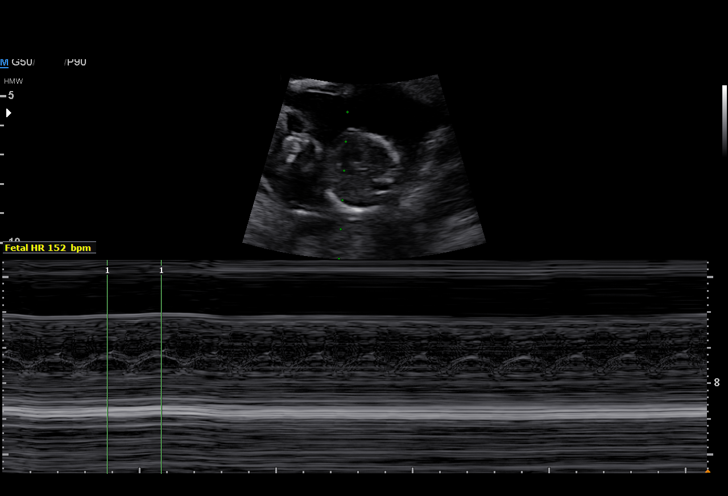

[14 of 28 positions shown; findings below may reference images not displayed]

1  WENDI PUENTES           [id number]      [id number]     [id number]
Indications

15 weeks gestation of pregnancy
Pregnancy resulting from assisted
reproductive technology (IVF)
Advanced maternal age multigravida 35+,
second trimester
Previous cesarean delivery, antepartum
Poor obstetric history: Previous
preeclampsia / eclampsia/gestational HTN
Poor obstetric history: Previous fetal growth
restriction (FGR)
Obesity complicating pregnancy, second
trimester (pregravid BMI 32.23)
Encounter for antenatal screening for
malformations
OB History

Blood Type:            Height:  5'8"   Weight (lb):  212       BMI:
Gravidity:    3         Term:   1        Prem:   0        SAB:   1
TOP:          0       Ectopic:  0        Living: 1
Fetal Evaluation

Num Of Fetuses:     1
Fetal Heart         152
Rate(bpm):
Cardiac Activity:   Observed
Presentation:       Variable
Placenta:           Anterior
P. Cord Insertion:  Visualized
Amniotic Fluid
AFI FV:      Subjectively within normal limits
Biometry

BPD:      31.1  mm     G. Age:  15w 5d         74  %    CI:        77.78   %    70 - 86
FL/HC:      19.2   %    15.3 -
HC:      111.6  mm     G. Age:  15w 3d         46  %    HC/AC:      1.17        1.05 -
AC:       95.5  mm     G. Age:  15w 5d         71  %    FL/BPD:     68.8   %
FL:       21.4  mm     G. Age:  16w 3d         89  %    FL/AC:      22.4   %    20 - 24
HUM:      16.9  mm     G. Age:  14w 6d         44  %

Est. FW:     140  gm      0 lb 5 oz   > 75  %
Gestational Age

Clinical EDD:  15w 1d                                        EDD:   12/14/17
U/S Today:     15w 6d                                        EDD:   12/09/17
Best:          15w 1d     Det. By:  Clinical EDD             EDD:   12/14/17
Anatomy

Cranium:               Appears normal         Abdominal Wall:         Appears nml (cord
insert, abd wall)
Choroid Plexus:        Appears normal         Bladder:                Appears normal
Thoracic:              Appears normal         Upper Extremities:      Visualized
Heart:                 Appears normal         Lower Extremities:      Visualized
(4CH, axis, and situs
Stomach:               Appears normal, left
sided
Cervix Uterus Adnexa

Cervix
Normal appearance by transabdominal scan.

Uterus
No abnormality visualized.

Left Ovary
No adnexal mass visualized.

Right Ovary
No adnexal mass visualized.

Cul De Sac:   No free fluid seen.

Adnexa:       No abnormality visualized.
Myomas

Site                     L(cm)      W(cm)      D(cm)      Location
Cervical
Anterior                 2

Blood Flow                 RI        PI       Comments
Impression

Singleton intrauterine pregnancy at 15+1 weeks with AMA
age 46. IVF with donor eggs (age 22). Previous myomectomy
Review of the anatomy shows no sonographic markers for
aneuploidy or structural anomalies
However, the anatomic survey should be considered
suboptimal secondary to early EGA
Placentation is anterior and is low-lying
Amniotic fluid volume is normal
Estimated fetal weight is 140g which is normal for this EGA
There are multiple small intramural fibroids as noted above
This does not appear to an implantation within a uterine scar
Recommendations

See MFM consult

## 2019-08-03 ENCOUNTER — Other Ambulatory Visit: Payer: Self-pay | Admitting: Internal Medicine

## 2019-09-18 DIAGNOSIS — Z20828 Contact with and (suspected) exposure to other viral communicable diseases: Secondary | ICD-10-CM | POA: Diagnosis not present

## 2019-11-26 DIAGNOSIS — Z1239 Encounter for other screening for malignant neoplasm of breast: Secondary | ICD-10-CM | POA: Diagnosis not present

## 2019-11-26 DIAGNOSIS — Z1231 Encounter for screening mammogram for malignant neoplasm of breast: Secondary | ICD-10-CM | POA: Diagnosis not present

## 2020-03-10 ENCOUNTER — Encounter: Payer: Self-pay | Admitting: Internal Medicine

## 2020-03-10 ENCOUNTER — Other Ambulatory Visit: Payer: Self-pay

## 2020-03-10 ENCOUNTER — Ambulatory Visit (INDEPENDENT_AMBULATORY_CARE_PROVIDER_SITE_OTHER): Payer: BLUE CROSS/BLUE SHIELD | Admitting: Internal Medicine

## 2020-03-10 VITALS — BP 130/88 | HR 75 | Ht 68.0 in | Wt 217.0 lb

## 2020-03-10 DIAGNOSIS — C73 Malignant neoplasm of thyroid gland: Secondary | ICD-10-CM

## 2020-03-10 DIAGNOSIS — E559 Vitamin D deficiency, unspecified: Secondary | ICD-10-CM | POA: Diagnosis not present

## 2020-03-10 DIAGNOSIS — E892 Postprocedural hypoparathyroidism: Secondary | ICD-10-CM

## 2020-03-10 DIAGNOSIS — E890 Postprocedural hypothyroidism: Secondary | ICD-10-CM

## 2020-03-10 LAB — TSH: TSH: 2.3 u[IU]/mL (ref 0.35–4.50)

## 2020-03-10 LAB — VITAMIN D 25 HYDROXY (VIT D DEFICIENCY, FRACTURES): VITD: 44.63 ng/mL (ref 30.00–100.00)

## 2020-03-10 LAB — T4, FREE: Free T4: 0.95 ng/dL (ref 0.60–1.60)

## 2020-03-10 NOTE — Patient Instructions (Signed)
Please continue: - vitamin D 5000 units daily - calcium 500 mg daily with dinner  Please continue levothyroxine 112 mcg daily.  Take the thyroid hormone every day, with water, at least 30 minutes before breakfast, separated by at least 4 hours from: - acid reflux medications - calcium - iron - multivitamins  Please stop at the lab.  Please come back for a follow-up appointment in 1 year.

## 2020-03-10 NOTE — Progress Notes (Signed)
Patient ID: LACYE MCCARN, female   DOB: 11-19-1970, 49 y.o.   MRN: [id number]  This visit occurred during the SARS-CoV-2 public health emergency.  Safety protocols were in place, including screening questions prior to the visit, additional usage of staff PPE, and extensive cleaning of exam room while observing appropriate contact time as indicated for disinfecting solutions.   HPI  ALLANNA BRESEE is a 49 y.o.-year-old female, initially referred by Dr. Dellis Filbert, presenting for follow-up for h/o papillary thyroid cancer,postsurgical hypothyroidism and also postsurgical hypoparathyroidism. Last visit 1 year ago (virtual).  Reviewed her PTC cancer history:  She had an exam with ObGyn on 12/15/2014 >> Dr. Dellis Filbert felt a lump in neck.  Thyroid U/S (02/23/2015): Hypoechoic, solid right-sided isthmus nodule measures approximately 1.4 x 0.6 x 1.0 cm. There are a few scattered areas of peripheral calcification in this nodule.    FNA (02/25/2015): FINDINGS CONSISTENT WITH PAPILLARY CARCINOMA ([place] CATEGORY VI).  Thyroidectomy (03/12/2015): 1. Thyroid, thyroidectomy, total thyroid sutures right lobe - MULTIFOCAL PAPILLARY THYROID CARCINOMA, TWO FOCI MEASURING 1.1 CM AND 0.6 CM IN GREATEST DIMENSION. - MARGINS ARE NEGATIVE. - ONE BENIGN LYMPH NODE WITH NO TUMOR SEEN (0/1). - SEE ONCOLOGY TEMPLATE. ADDITIONAL FINDINGS: - INCIDENTAL BENIGN PARATHYROID TISSUE (0.3 CM). 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 2. Lymph nodes, regional resection, central compartment - THREE BENIGN LYMPH NODES WITH NO TUMOR SEEN (0/3). - INCIDENTAL BENIGN THYMIC TISSUE.  1. THYROID Specimen: Total thyroid with central compartment lymph nodes. Procedure: Total thyroidectomy with central compartment lymph node dissection. Specimen Integrity (intact/fragmented): Intact. Tumor focality: Multifocal, two foci.  Dominant tumor: Maximum tumor size (cm): 1.1 cm Tumor laterality: Isthmus (central). Histologic type  (including subtype and/or unique features as applicable): Papillary thyroid carcinoma, conventional type. Tumor capsule: No tumor capsule identified. Extrathyroidal extension: No. Margins: Negative. Lymph - Vascular invasion: Not identified. Capsular invasion with degree of invasion if present: Not applicable.  Second tumor: Tumor size(s): 0.6 cm Tumor laterality: Right superior. Histologic type (including subtype and/or unique features as applicable): Papillary thyroid carcinoma, conventional type. Tumor capsule: No tumor capsule identified. Extrathyroidal extension: No. Margins: Negative. Lymph - Vascular invasion: Not identified. Capsular invasion with degree of invasion if present: Not applicable. Lymph nodes: # examined 4; # positive; 0.  She did not have RAI treatment due to small sizes of the tumors.  04/11/2016: Neck U/S:  No evidence of tissue in the right or left thyroid beds to suggest recurrence of residual thyroid tissue.  No evidence of abnormal adenopathy.  Her thyroglobulin's are low and her globulin antibodies are not elevated: Lab Results  Component Value Date   THYROGLB 0.2 (L) 02/14/2018   THYROGLB 0.2 (L) 02/14/2017   THYROGLB 0.1 (L) 03/17/2016   THYROGLB 0.2 (L) 09/18/2015   Lab Results  Component Value Date   THGAB <1 02/14/2018   THGAB <1 02/14/2017   THGAB <1 03/17/2016   THGAB <1 09/18/2015   Pt denies: - feeling nodules in neck - hoarseness - dysphagia - choking - SOB with lying down  Reviewed her TFTs: Lab Results  Component Value Date   TSH 1.750 03/29/2018   TSH 0.29 (L) 02/14/2018   TSH 0.74 01/04/2018   TSH 0.736 06/23/2017   TSH 0.127 (L) 05/23/2017   TSH 1.06 02/14/2017   TSH 0.90 03/17/2016   FREET4 1.35 03/29/2018   FREET4 1.12 02/14/2018   FREET4 1.00 01/04/2018   FREET4 1.42 06/23/2017   FREET4 1.97 (H) 05/23/2017   FREET4 1.07 02/14/2017   FREET4  1.04 03/17/2016  Received labs from Dr. Ronita Hipps, from 08/30/2017: -  TSH 0.58, free T4 1.42, both at goal  Pt is on levothyroxine 112 mcg daily, taken: - in am - fasting - at least 30 min from b'fast - + Ca at night, no Fe, + MVI at night, no PPIs - not on Biotin  She  also has a history of HTN during previous pregnancy >> son born 10/2014.  She had IVF in 2018 >> healthy baby girl born 11/2017.   At last visit, she complained about tingling in her feet.  Vitamin B12 level was normal: Lab Results  Component Value Date   VITAMINB12 401 02/14/2018   Her PTH, calcium, and vitamin D levels were low in 2019.  We started: -Calcium 500 mg with dinner >> then stopped, only take it as needed, 1-2 times a month >> daily (ran out 2 days ago) -Vitamin D 5000 units daily >> then 2500 units daily >> 5000 units daily  She occasionally has tingling in her hands and takes an extra calcium tablet, which resolves her symptoms.  Reviewed pertinent labs: Lab Results  Component Value Date   PTH 13 (L) 02/14/2018   PTH Comment 02/14/2018   CALCIUM 8.9 03/29/2018   CALCIUM 7.9 (L) 02/14/2018   CALCIUM 7.8 (L) 11/24/2017   CALCIUM 8.7 04/17/2015   CALCIUM 8.3 (L) 03/31/2015   CALCIUM 8.4 (L) 03/29/2015   CALCIUM 8.0 (L) 03/20/2015   CALCIUM 8.6 (L) 03/13/2015   CALCIUM 9.2 03/11/2015   CALCIUM 8.6 11/06/2014   Lab Results  Component Value Date   VD25OH 46.4 03/29/2018   VD25OH 19.27 (L) 02/14/2018   ROS: Constitutional: no weight gain/no weight loss, no fatigue, no subjective hyperthermia, no subjective hypothermia Eyes: no blurry vision, no xerophthalmia ENT: no sore throat, + see HPI Cardiovascular: no CP/no SOB/no palpitations/no leg swelling Respiratory: no cough/no SOB/no wheezing Gastrointestinal: no N/no V/no D/no C/no acid reflux Musculoskeletal: + muscle aches/+ joint aches Skin: no rashes, + hair loss Neurological: no tremors/+ numbness/no tingling/no dizziness  I reviewed pt's medications, allergies, PMH, social hx, family hx, and changes  were documented in the history of present illness. Otherwise, unchanged from my initial visit note.  Past Medical History:  Diagnosis Date  . Complication of anesthesia    trouble waking up after second myomectomy  . Dermatitis   . Diabetes mellitus without complication ([place])   . Dysrhythmia    resolved- "grew out of it" - no problem since teenager  . Genital warts    removed  . Gestational diabetes   . Hypertension 2016   [place] with 2016 pregnancy / no BP meds since birth of child  . Lichen sclerosus   . Newborn product of in vitro fertilization (IVF) pregnancy   . Pregnancy induced hypertension   . Termination of pregnancy (fetus)    x 1  . Thyroid cancer [facility])    Past Surgical History:  Procedure Laterality Date  . BLADDER SURGERY  1992   Widened the urethra  . CESAREAN SECTION N/A 11/04/2014   Procedure: CESAREAN SECTION;  Surgeon: [name], MD;  Location: [facility];  Service: Obstetrics;  Laterality: N/A;  EDD: 11/23/14  . CESAREAN SECTION N/A 11/24/2017   Procedure: Repeat CESAREAN SECTION;  Surgeon: Brien Few, MD;  Location: [place];  Service: Obstetrics;  Laterality: N/A;  EDD: 12/14/17 Allergy: Erythromycin, Augmentin  . LYMPH NODE DISSECTION  03/12/2015   Procedure: LYMPH NODE DISSECTION;  Surgeon: Armandina Gemma, MD;  Location: [facility];  Service: General;;  . myomectomy  2008  . MYOMECTOMY  2014   Removed tubes bilaterally  . MYOMECTOMY N/A 11/04/2014   Procedure: MYOMECTOMY;  Surgeon: [name], MD;  Location: [facility];  Service: Obstetrics;  Laterality: N/A;  . THYROIDECTOMY N/A 03/12/2015   Procedure: TOTAL THYROIDECTOMY;  Surgeon: Armandina Gemma, MD;  Location: [facility];  Service: General;  Laterality: N/A;  . TUBAL LIGATION    . WISDOM TOOTH EXTRACTION     History   Social History  . Marital Status: Married    Spouse Name: N/A  . Number of Children: 1   Occupational History  .  accountant    Social History Main Topics  . Smoking status:  Former Smoker -- 1.00 packs/day for 20 years    Types: Cigarettes    Quit date: 08/11/2007  . Smokeless tobacco: Never Used  . Alcohol Use: Yes     Comment: Beer/liquor 1 drink once a week   . Drug Use: Yes    Special: Marijuana, Cocaine     Comment: 25 years ago "Acid"  , last use cocaine/marijuana 25 yrs ago   Current Outpatient Medications on File Prior to Visit  Medication Sig Dispense Refill  . SYNTHROID 112 MCG tablet TAKE 1 TABLET(112 MCG) BY MOUTH DAILY BEFORE BREAKFAST 45 tablet 5   No current facility-administered medications on file prior to visit.   Allergies  Allergen Reactions  . Augmentin [Amoxicillin-Pot Clavulanate] Nausea And Vomiting  . Erythromycin Nausea And Vomiting   No family history available. Patient is adopted.  She has lichen sclerosus >> on Clobetasol.  PE: BP 130/88   Pulse 75   Ht '5\' 8"'  (1.727 m)   Wt 217 lb (98.4 kg)   SpO2 99%   BMI 32.99 kg/m  Body mass index is 32.99 kg/m. Wt Readings from Last 3 Encounters:  03/10/20 217 lb (98.4 kg)  02/14/18 195 lb 9.6 oz (88.7 kg)  11/24/17 204 lb (92.5 kg)   Constitutional: overweight, in NAD Eyes: PERRLA, EOMI, no exophthalmos ENT: moist mucous membranes, no thyromegaly, no cervical lymphadenopathy Cardiovascular: RRR, No MRG Respiratory: CTA B Gastrointestinal: abdomen soft, NT, ND, BS+ Musculoskeletal: no deformities, strength intact in all 4 Skin: moist, warm, no rashes Neurological: no tremor with outstretched hands, DTR normal in all 4  ASSESSMENT: 1. Bifocal papillary thyroid cancer  2. Postsurgical hypothyroidism  3.  Postsurgical hypoparathyroidism  4.  Vitamin D deficiency  PLAN: 1. Papillary thyroid cancer -Reviewed her papillary thyroid cancer features:  Not encapsulated Multifocal, but the second focus was a micro PTC No extension in the lymph or blood vessels  Margins free of disease Latest neck ultrasound from 2017 showed no recurrence or metastasis in the  neck Overall, she is stage I PTC, which has a very good prognosis >> I did not recommend RAI treatment -Her thyroglobulin level remains low (levels between 0.1 and 0.2), however, she did not have labs in 2020 due to the coronavirus pandemic -We will recheck thyroglobulin and ATA today -Her thyroidectomy scar is healed without discomfort or keloid -I will see her back in a year  2. Postsurgical hypothyroidism - latest thyroid labs reviewed with pt >> normal: Lab Results  Component Value Date   TSH 1.750 03/29/2018   - she continues on LT4 112 mcg daily - pt feels good on this dose. - we discussed about taking the thyroid hormone every day, with water, >30 minutes before breakfast, separated by >4 hours from  acid reflux medications, calcium, iron, multivitamins. Pt. is taking it correctly. - will check thyroid tests today: TSH and fT4 - If labs are abnormal, she will need to return for repeat TFTs in 1.5 months  3.  Postsurgical hypoparathyroidism and 4. vitamin D deficiency -In 2019, calcium, PTH, vitamin D level were all normal.  We started her on replacement with calcium 500 mg with dinner and vitamin D 5000 units daily. -Subsequent calcium and vitamin D levels were normal 03/2019, however, she decreased her vitamin D dose since then to 2500 units and stopped the calcium with dinner.  However, since last visit, she takes calcium consistently every day, but she ran out in the last few days..  She also continues on 5000 units vitamin D daily. -At last visit, she was complaining of generalized aches and pains and we discussed about restarting calcium with dinner.  I also advised her to have labs checked at [place] (this was a virtual appointment).  However, I do not have recent labs for her. -At today's visit, we will recheck calcium, PTH, vitamin D   Orders Placed This Encounter  Procedures  . US THYROID  . TSH  . T4, free  . VITAMIN D 25 Hydroxy (Vit-D Deficiency, Fractures)  . PTH,  intact and calcium  . Thyroglobulin antibody  . Thyroglobulin Level   Needs refills. Component     Latest Ref Rng & Units 03/10/2020  Calcium     8.7 - 10.2 mg/dL 8.0 (L)  PTH, Intact     15 - 65 pg/mL 14 (L)  PTH Interp      Comment  Thyroglobulin     ng/mL 0.2 (L)  Comment        T4,Free(Direct)     0.60 - 1.60 ng/dL 0.95  TSH     0.35 - 4.50 uIU/mL 2.30  Thyroglobulin Ab     < or = 1 IU/mL <1  VITD     30.00 - 100.00 ng/mL 44.63  Calcium level is slightly low and PTH is also low.  We will have her restart calcium 500 mg with dinner Thyroglobulin is still detectable but quite low, as expected in the absence of RAI treatment.  Antithyroglobulin antibodies are not elevated.  Vitamin D is normal.  TFTs are normal.   Philemon Kingdom, MD PhD [facility] Endocrinology

## 2020-03-11 LAB — THYROGLOBULIN ANTIBODY: Thyroglobulin Ab: <1 IU/mL (ref <=1)

## 2020-03-11 LAB — THYROGLOBULIN LEVEL: Thyroglobulin: 0.2 ng/mL — ABNORMAL LOW

## 2020-03-11 LAB — PTH, INTACT AND CALCIUM
Calcium: 8 mg/dL — ABNORMAL LOW (ref 8.7–10.2)
PTH: 14 pg/mL — ABNORMAL LOW (ref 15–65)

## 2020-03-11 MED ORDER — SYNTHROID 112 MCG PO TABS: ORAL_TABLET | ORAL | 3 refills | Status: DC

## 2020-03-18 ENCOUNTER — Other Ambulatory Visit: Payer: Self-pay

## 2020-03-18 ENCOUNTER — Ambulatory Visit (INDEPENDENT_AMBULATORY_CARE_PROVIDER_SITE_OTHER): Payer: BLUE CROSS/BLUE SHIELD | Admitting: Family Medicine

## 2020-03-18 ENCOUNTER — Other Ambulatory Visit: Payer: Self-pay | Admitting: Family Medicine

## 2020-03-18 ENCOUNTER — Encounter: Payer: Self-pay | Admitting: Family Medicine

## 2020-03-18 VITALS — BP 143/87 | HR 55 | Temp 97.5°F | Resp 16 | Ht 67.5 in | Wt 215.0 lb

## 2020-03-18 DIAGNOSIS — R739 Hyperglycemia, unspecified: Secondary | ICD-10-CM

## 2020-03-18 DIAGNOSIS — M7712 Lateral epicondylitis, left elbow: Secondary | ICD-10-CM

## 2020-03-18 DIAGNOSIS — Z1211 Encounter for screening for malignant neoplasm of colon: Secondary | ICD-10-CM

## 2020-03-18 DIAGNOSIS — Z0000 Encounter for general adult medical examination without abnormal findings: Secondary | ICD-10-CM | POA: Diagnosis not present

## 2020-03-18 LAB — COMPREHENSIVE METABOLIC PANEL
ALT: 13 U/L (ref 0–35)
AST: 13 U/L (ref 0–37)
Albumin: 4.4 g/dL (ref 3.5–5.2)
Alkaline Phosphatase: 57 U/L (ref 39–117)
BUN: 10 mg/dL (ref 6–23)
CO2: 28 mEq/L (ref 19–32)
Calcium: 7.7 mg/dL — ABNORMAL LOW (ref 8.4–10.5)
Chloride: 102 mEq/L (ref 96–112)
Creatinine, Ser: 0.85 mg/dL (ref 0.40–1.20)
GFR: 70.42 mL/min (ref >60.00)
Glucose, Bld: 100 mg/dL — ABNORMAL HIGH (ref 70–99)
Potassium: 4.2 mEq/L (ref 3.5–5.1)
Sodium: 137 mEq/L (ref 135–145)
Total Bilirubin: 0.8 mg/dL (ref 0.2–1.2)
Total Protein: 6.7 g/dL (ref 6.0–8.3)

## 2020-03-18 LAB — CBC
HCT: 37.8 % (ref 36.0–46.0)
Hemoglobin: 12.9 g/dL (ref 12.0–15.0)
MCHC: 34.1 g/dL (ref 30.0–36.0)
MCV: 84.5 fl (ref 78.0–100.0)
Platelets: 255 10*3/uL (ref 150.0–400.0)
RBC: 4.47 Mil/uL (ref 3.87–5.11)
RDW: 13.6 % (ref 11.5–15.5)
WBC: 9.2 10*3/uL (ref 4.0–10.5)

## 2020-03-18 LAB — HEMOGLOBIN A1C: Hgb A1c MFr Bld: 6.4 % (ref 4.6–6.5)

## 2020-03-18 LAB — LIPID PANEL
Cholesterol: 193 mg/dL (ref 0–200)
HDL: 55.4 mg/dL (ref >39.00)
LDL Cholesterol: 109 mg/dL — ABNORMAL HIGH (ref 0–99)
NonHDL: 137.85
Total CHOL/HDL Ratio: 3
Triglycerides: 142 mg/dL (ref 0.0–149.0)
VLDL: 28.4 mg/dL (ref 0.0–40.0)

## 2020-03-18 NOTE — Patient Instructions (Addendum)
Give [place] 2-3 business days to get the results of your labs back.   The new Shingrix vaccine (for shingles) is a 2 shot series. It can make people feel low energy, achy and almost like they have the flu for 48 hours after injection. Please plan accordingly when deciding on when to get this shot. Call our office for a nurse visit appointment to get this. The second shot of the series is less severe regarding the side effects, but it still lasts 48 hours.   Keep the diet clean and stay active.  Consider a forearm strap (Band-IT) to help with your elbow. This can give your elbow a break and allow it to heal faster.  [email or web address]  Elbow and Forearm Exercises It is normal to feel mild stretching, pulling, tightness, or discomfort as you do these exercises, but you should stop right away if you feel sudden pain or your pain gets worse. RANGE OF MOTION EXERCISES These exercises warm up your muscles and joints and improve the movement and flexibility of your injured elbow and forearm. These exercises also help to relieve pain, numbness, and tingling.These exercises are done using the muscles in your injured elbow and forearm. Exercise A: Elbow Flexion, Active 1. Hold your left / right arm at your side, and bend your elbow as far as you can using your left / right arm muscles. 2. Hold this position for 30 seconds. 3. Slowly return to the starting position. Repeat 2 times. Complete this exercise 3 times per week. Exercise B: Elbow Extension, Active 1. Hold your left / right arm at your side, and straighten your elbow as much as you can using your left / right arm muscles. 2. Hold this position for 30 seconds. 3. Slowly return to the starting position. Repeat 2 times. Complete this exercise 3 times per week. Exercise C: Forearm Rotation, Supination, Active 1. Stand or sit with your elbows at your sides. 2. Bend your left / right  elbow to an "L" shape (90 degrees). 3. Turn your palm upward until you feel a gentle stretch on the inside of your forearm. 4. Hold this position for 30 seconds. 5. Slowly release and return to the starting position. Repeat 2 times. Complete this exercise 3 times per week. Exercise D: Forearm Rotation, Pronation, Active 1. Stand or sit with your elbows at your side. 2. Bend your left / right elbow to an "L" shape (90 degrees). 3. Turn your left / right palm downward until you feel a gentle stretch on the top of your forearm. 4. Hold this position for 30 seconds. 5. Slowly release and return to the starting position. Repeat2 times. Complete this exercise 3 times per week. STRETCHING EXERCISES These exercises warm up your muscles and joints and improve the movement and flexibility of your injured elbow and forearm. These exercises also help to relieve pain, numbness, and tingling.These exercises are done using your healthy elbow and forearm to help stretch the muscles in your injured elbow and forearm. Exercise E: Elbow Flexion, Active-Assisted  1. Hold your left / right arm at your side, and bend your elbow as much as you can using your left / right arm muscles. 2. Use your other hand to bend your left / right elbow farther. To do this, gently push up on your forearm until you feel a gentle stretch on the back of your elbow. 3. Hold this position for 30 seconds. 4. Slowly return to the starting position. Repeat 2 times. Complete this exercise  3 times per week. Exercise F: Elbow Extension, Active-Assisted  1. Hold your left / right arm at your side, and straighten your elbow as much as you can using your left / right arm muscles. 2. Use your other hand to straighten the left / right elbow farther. To do this, gently push down on your forearm until you feel a gentle stretch on the inside of your elbow. 3. Hold this position for 30 seconds. 4. Slowly return to the starting position. Repeat 2  times. Complete this exercise 3 times per weeky. Exercise G: Forearm Rotation, Supination, Active-Assisted  1. Sit with your left / right elbow bent in an "L" shape (90 degrees) with your forearm resting on a table. 2. Keeping your upper body and shoulder still, rotate your forearm so your left / right palm faces upward. 3. Use your other hand to help rotate your forearm further until you feel a gentle to moderate stretch. 4. Hold this position for 30 seconds. 5. Slowly release the stretch and return to the starting position. Repeat 2 times. Complete this exercise 3 times per week. Exercise H: Forearm Rotation, Pronation, Active-Assisted  1. Sit with your left / right elbow bent in an "L" shape (90 degrees) with your forearm resting on a table. 2. Keeping your upper body and shoulder still, rotate your forearm so your palm faces the tabletop. 3. Use your other hand to help rotate your forearm further until you feel a gentle to moderate stretch. 4. Hold this position for 30 seconds. 5. Slowly release the stretch and return to the starting position. Repeat 2 times. Complete this exercise 3 times per week. Exercise I: Elbow Flexion, Supine, Passive 1. Lie on your back. 2. Extend your left / right arm up in the air, bracing it with your other hand. 3. Let your left / right your hand slowly lower toward your shoulder, while your elbow stays pointed toward the ceiling. You should feel a gentle stretch along the back of your upper arm and elbow. 4. If instructed by your health care provider, you may increase the intensity of your stretch by adding a small wrist weight or hand weight. 5. Hold this position for 3 seconds. 6. Slowly return to the starting position. Repeat 2 times. Complete this exercise 3 times per week. Exercise J: Elbow Extension, Supine, Passive  1. Lie on your back. Make sure that you are in a comfortable position that lets you relax your arm muscles. 2. Place a folded towel  under your left / right upper arm so your elbow and shoulder are at the same height. Straighten your left / right arm so your elbow does not rest on the bed or towel. 3. Let the weight of your hand stretch your elbow. Keep your arm and chest muscles relaxed. You should feel a stretch on the inside of your elbow. 4. If told by your health care provider, you may increase the intensity of your stretch by adding a small wrist weight or hand weight. 5. Hold this position for 30 seconds. 6. Slowly release the stretch. Repeat 2 times. Complete this exercise 3 times per week. STRENGTHENING EXERCISES These exercises build strength and endurance in your elbow and forearm. Endurance is the ability to use your muscles for a long time, even after they get tired. Exercise K: Elbow Flexion, Isometric  1. Stand or sit up straight. 2. Bend your left / right elbow in an "L" shape (90 degrees) and turn your palm up so  your forearm is at the height of your waist. 3. Place your other hand on top of your forearm. Gently push down as your left / right arm resists. Push as hard as you can with both arms without causing any pain or movement at your left / right elbow. 4. Hold this position for 3 seconds. 5. Slowly release the tension in both arms. Let your muscles relax completely before repeating. Repeat 2 times. Complete this exercise 3 times per week. Exercise L: Elbow Extensors, Isometric  1. Stand or sit up straight. 2. Place your left / right arm so your palm faces your abdomen and it is at the height of your waist. 3. Place your other hand on the underside of your forearm. Gently push up as your left / right arm resists. Push as hard as you can with both arms, without causing any pain or movement at your left / right elbow. 4. Hold this position for 3 seconds. 5. Slowly release the tension in both arms. Let your muscles relax completely before repeating. Repeat _______2___ times. Complete this exercise 3 times  per week. Exercise M: Elbow Flexion With Forearm Palm Up  1. Sit upright on a firm chair without armrests, or stand. 2. Place your left / right arm at your side with your palm facing forward. 3. Holding a 5 lbweight or gripping a rubber exercise band or tubing, bend your elbow to bring your hand toward your shoulder. 4. Hold this position for 3 seconds. 5. Slowly return to the starting position. Repeat 2 times. Complete this exercise 3 times per week. Exercise N: Elbow Extension  1. Sit on a firm chair without armrests, or stand. 2. Keeping your upper arms at your sides, bring both hands up toward your left / right shoulder while you grip a rubber exercise band or tubing. Your left / right hand should be just below the other hand. 3. Straighten your left / right elbow. 4. Hold this position for 3 seconds. 5. Control the resistance of the band or tubing as your hand returns to your side. Repeat 2 times. Complete this exercise 3 times per week. Exercise O: Forearm Rotation, Supination  1. Sit with your left / right forearm supported on a table. Keep your elbow at waist height. 2. Rest your hand over the edge of the table with your palm facing down. 3. Gently hold a lightweight hammer. 4. Without moving your elbow, slowly rotate your forearm to turn your palm and hand upward to a "thumbs-up" position. 5. Hold this position for 3 seconds. 6. Slowly return to the starting position. Repeat 2 times. Complete this exercise 3 times per week. Exercise P: Forearm Rotation, Pronation  1. Sit with your left / right forearm supported on a table. Keep your elbow below shoulder height. 2. Rest your hand over the edge of the table with your palm facing up. 3. Gently hold a lightweight hammer. 4. Without moving your elbow, slowly rotate your forearm to turn your palm and hand upward to a "thumbs-up" position. 5. Hold this position for 3 seconds. 6. Slowly return to the starting position. Repeat 2  times. Complete this exercise 3 times per week.  Make sure you discuss any questions you have with your health care provider. Document Released: 08/10/2005 Document Revised: 02/04/2016 Document Reviewed: 06/21/2015 Elsevier Interactive Patient Education  2018 [place] Many factors influence your heart health, including eating and exercise habits. Heart (coronary) risk increases with abnormal blood fat (  lipid) levels. Heart-healthy meal planning includes limiting unhealthy fats, increasing healthy fats, and making other small dietary changes. This includes maintaining a healthy body weight to help keep lipid levels within a normal range.  WHAT IS MY PLAN?  Your health care provider recommends that you:  Drink a glass of water before meals to help with satiety.  Eat slowly.  An alternative to the water is to add Metamucil. This will help with satiety as well. It does contain calories, unlike water.  WHAT TYPES OF FAT SHOULD I CHOOSE?  Choose healthy fats more often. Choose monounsaturated and polyunsaturated fats, such as olive oil and canola oil, flaxseeds, walnuts, almonds, and seeds.  Eat more omega-3 fats. Good choices include salmon, mackerel, sardines, tuna, flaxseed oil, and ground flaxseeds. Aim to eat fish at least two times each week.  Avoid foods with partially hydrogenated oils in them. These contain trans fats. Examples of foods that contain trans fats are stick margarine, some tub margarines, cookies, crackers, and other baked goods. If you are going to avoid a fat, this is the one to avoid!  WHAT GENERAL GUIDELINES DO I NEED TO FOLLOW?  Check food labels carefully to identify foods with trans fats. Avoid these types of options when possible.  Fill one half of your plate with vegetables and green salads. Eat 4-5 servings of vegetables per day. A serving of vegetables equals 1 cup of raw leafy vegetables,  cup of raw or cooked cut-up vegetables, or   cup of vegetable juice.  Fill one fourth of your plate with whole grains. Look for the word "whole" as the first word in the ingredient list.  Fill one fourth of your plate with lean protein foods.  Eat 4-5 servings of fruit per day. A serving of fruit equals one medium whole fruit,  cup of dried fruit,  cup of fresh, frozen, or canned fruit. Try to avoid fruits in cups/syrups as the sugar content can be high.  Eat more foods that contain soluble fiber. Examples of foods that contain this type of fiber are apples, broccoli, carrots, beans, peas, and barley. Aim to get 20-30 g of fiber per day.  Eat more home-cooked food and less restaurant, buffet, and fast food.  Limit or avoid alcohol.  Limit foods that are high in starch and sugar.  Avoid fried foods when able.  Cook foods by using methods other than frying. Baking, boiling, grilling, and broiling are all great options. Other fat-reducing suggestions include: ? Removing the skin from poultry. ? Removing all visible fats from meats. ? Skimming the fat off of stews, soups, and gravies before serving them. ? Steaming vegetables in water or broth.  Lose weight if you are overweight. Losing just 5-10% of your initial body weight can help your overall health and prevent diseases such as diabetes and heart disease.  Increase your consumption of nuts, legumes, and seeds to 4-5 servings per week. One serving of dried beans or legumes equals  cup after being cooked, one serving of nuts equals 1 ounces, and one serving of seeds equals  ounce or 1 tablespoon.  WHAT ARE GOOD FOODS CAN I EAT? Grains Grainy breads (try to find bread that is 3 g of fiber per slice or greater), oatmeal, light popcorn. Whole-grain cereals. Rice and pasta, including brown rice and those that are made with whole wheat. Edamame pasta is a great alternative to grain pasta. It has a higher protein content. Try to avoid significant consumption of white  bread, sugary  cereals, or pastries/baked goods.  Vegetables All vegetables. Cooked white potatoes do not count as vegetables.  Fruits All fruits, but limit pineapple and bananas as these fruits have a higher sugar content.  Meats and Other Protein Sources Lean, well-trimmed beef, veal, pork, and lamb. Chicken and [place] without skin. All fish and shellfish. Wild duck, rabbit, pheasant, and venison. Egg whites or low-cholesterol egg substitutes. Dried beans, peas, lentils, and tofu.Seeds and most nuts.  Dairy Low-fat or nonfat cheeses, including ricotta, string, and mozzarella. Skim or 1% milk that is liquid, powdered, or evaporated. Buttermilk that is made with low-fat milk. Nonfat or low-fat yogurt. Soy/Almond milk are good alternatives if you cannot handle dairy.  Beverages Water is the best for you. Sports drinks with less sugar are more desirable unless you are a highly active athlete.  Sweets and Desserts Sherbets and fruit ices. Honey, jam, marmalade, jelly, and syrups. Dark chocolate.  Eat all sweets and desserts in moderation.  Fats and Oils Nonhydrogenated (trans-free) margarines. Vegetable oils, including soybean, sesame, sunflower, olive, peanut, safflower, corn, canola, and cottonseed. Salad dressings or mayonnaise that are made with a vegetable oil. Limit added fats and oils that you use for cooking, baking, salads, and as spreads.  Other Cocoa powder. Coffee and tea. Most condiments.  The items listed above may not be a complete list of recommended foods or beverages. Contact your dietitian for more options.

## 2020-03-18 NOTE — Progress Notes (Signed)
Chief Complaint  Patient presents with  . New Patient (Initial Visit)    Here to stablish care     Well Woman Pamela [name] is here for a complete physical.   Her last physical was >1 year ago.  Current diet: in general, diet could be better. Current exercise: none. Weight is stable and she denies daytime fatigue out of ordinary. Seatbelt? Yes  Health Maintenance Pap/HPV- Yes Mammogram- Yes Colon cancer screening-No Shingrix- No Tetanus- Yes HIV screening- Yes  Past Medical History:  Diagnosis Date  . Complication of anesthesia    trouble waking up after second myomectomy  . Dermatitis   . Diabetes mellitus without complication ([place])   . Dysrhythmia    resolved- "grew out of it" - no problem since teenager  . Genital warts    removed  . Gestational diabetes   . Hypertension 2016   [place] with 2016 pregnancy / no BP meds since birth of child  . Lichen sclerosus   . Newborn product of in vitro fertilization (IVF) pregnancy   . Pregnancy induced hypertension   . Termination of pregnancy (fetus)    x 1  . Thyroid cancer [facility])      Past Surgical History:  Procedure Laterality Date  . BLADDER SURGERY  1992   Widened the urethra  . CESAREAN SECTION N/A 11/04/2014   Procedure: CESAREAN SECTION;  Surgeon: [name], MD;  Location: [facility];  Service: Obstetrics;  Laterality: N/A;  EDD: 11/23/14  . CESAREAN SECTION N/A 11/24/2017   Procedure: Repeat CESAREAN SECTION;  Surgeon: Brien Few, MD;  Location: [facility];  Service: Obstetrics;  Laterality: N/A;  EDD: 12/14/17 Allergy: Erythromycin, Augmentin  . LYMPH NODE DISSECTION  03/12/2015   Procedure: LYMPH NODE DISSECTION;  Surgeon: Armandina Gemma, MD;  Location: [facility];  Service: General;;  . myomectomy  2008  . MYOMECTOMY  2014   Removed tubes bilaterally  . MYOMECTOMY N/A 11/04/2014   Procedure: MYOMECTOMY;  Surgeon: [name], MD;  Location: [facility];  Service: Obstetrics;  Laterality: N/A;  .  THYROIDECTOMY N/A 03/12/2015   Procedure: TOTAL THYROIDECTOMY;  Surgeon: Armandina Gemma, MD;  Location: [facility];  Service: General;  Laterality: N/A;  . TUBAL LIGATION    . WISDOM TOOTH EXTRACTION      Medications  Current Outpatient Medications on File Prior to Visit  Medication Sig Dispense Refill  . clobetasol ointment (TEMOVATE) 1.32 % Apply 1 application topically 2 (two) times daily.    Marland Kitchen SYNTHROID 112 MCG tablet TAKE 1 TABLET(112 MCG) BY MOUTH DAILY BEFORE BREAKFAST 90 tablet 3   No current facility-administered medications on file prior to visit.     Allergies Allergies  Allergen Reactions  . Augmentin [Amoxicillin-Pot Clavulanate] Nausea And Vomiting  . Erythromycin Nausea And Vomiting    Review of Systems: Constitutional:  no unexpected weight changes Eye:  no recent significant change in vision Ear/Nose/Mouth/Throat:  Ears:  no recent change in hearing Nose/Mouth/Throat:  no complaints of nasal congestion, no sore throat Cardiovascular: no chest pain Respiratory:  no shortness of breath Gastrointestinal:  no abdominal pain, no change in bowel habits GU:  Female: negative for dysuria or pelvic pain Musculoskeletal/Extremities:  +L elbow pain Integumentary (Skin/Breast):  no abnormal skin lesions reported Neurologic:  no headaches Endocrine:  denies fatigue Hematologic/Lymphatic:  No areas of easy bleeding  Exam BP (!) 143/87 (BP Location: Left Arm, Patient Position: Sitting, Cuff Size: Small)   Pulse (!) 55   Temp (!) 97.5  F (36.4 C) (Temporal)   Resp 16   Ht 5' 7.5" (1.715 m)   Wt 215 lb (97.5 kg)   SpO2 98%   BMI 33.18 kg/m  General:  well developed, well nourished, in no apparent distress Skin:  no significant moles, warts, or growths Head:  no masses, lesions, or tenderness Eyes:  pupils equal and round, sclera anicteric without injection Ears:  canals without lesions, TMs shiny without retraction, no obvious effusion, no erythema Nose:  nares patent,  septum midline, mucosa normal, and no drainage or sinus tenderness Throat/Pharynx:  lips and gingiva without lesion; tongue and uvula midline; non-inflamed pharynx; no exudates or postnasal drainage Neck: neck supple without adenopathy, thyromegaly, or masses Lungs:  clear to auscultation, breath sounds equal bilaterally, no respiratory distress Cardio:  regular rate and rhythm, no LE edema Abdomen:  abdomen soft, nontender; bowel sounds normal; no masses or organomegaly Genital: Defer to GYN Musculoskeletal:  + Tenderness over the left lateral epicondyle, pain with resisted wrist extension on the left Extremities:  no clubbing, cyanosis, or edema, no deformities, no skin discoloration Neuro:  gait normal; deep tendon reflexes normal and symmetric Psych: well oriented with normal range of affect and appropriate judgment/insight  Assessment and Plan  Well adult exam - Plan: CBC, Comprehensive metabolic panel, Lipid panel, Hemoglobin A1c  Screen for colon cancer - Plan: Ambulatory referral to Gastroenterology  Lateral epicondylitis of left elbow   Well 49 y.o. female. Counseled on diet and exercise. Monitor blood pressure.  Weight loss will likely help with this.  Information regarding the [place] weight loss clinic provided.  Offered nutritionist referral as well. Referred to GI for colonoscopy. Forearm strap for tennis elbow recommended in addition to ice and stretches/exercises.  Consider PT versus injection in future. Follow up in 1 yr or prn. The patient voiced understanding and agreement to the plan.  [place], DO 03/18/20 12:01 PM

## 2020-03-20 ENCOUNTER — Encounter: Payer: Self-pay | Admitting: Gastroenterology

## 2020-03-20 DIAGNOSIS — Z124 Encounter for screening for malignant neoplasm of cervix: Secondary | ICD-10-CM | POA: Diagnosis not present

## 2020-03-20 DIAGNOSIS — Z1151 Encounter for screening for human papillomavirus (HPV): Secondary | ICD-10-CM | POA: Diagnosis not present

## 2020-03-20 DIAGNOSIS — R52 Pain, unspecified: Secondary | ICD-10-CM | POA: Diagnosis not present

## 2020-03-20 DIAGNOSIS — L900 Lichen sclerosus et atrophicus: Secondary | ICD-10-CM | POA: Diagnosis not present

## 2020-03-20 DIAGNOSIS — Z01419 Encounter for gynecological examination (general) (routine) without abnormal findings: Secondary | ICD-10-CM | POA: Diagnosis not present

## 2020-03-25 ENCOUNTER — Other Ambulatory Visit: Payer: BLUE CROSS/BLUE SHIELD

## 2020-04-06 ENCOUNTER — Ambulatory Visit
Admission: RE | Admit: 2020-04-06 | Discharge: 2020-04-06 | Disposition: A | Discharge status: Home / Self Care | Payer: BLUE CROSS/BLUE SHIELD | Source: Ambulatory Visit | Attending: Internal Medicine | Admitting: Internal Medicine

## 2020-04-06 DIAGNOSIS — E890 Postprocedural hypothyroidism: Secondary | ICD-10-CM | POA: Diagnosis not present

## 2020-04-06 DIAGNOSIS — C73 Malignant neoplasm of thyroid gland: Secondary | ICD-10-CM

## 2020-04-16 ENCOUNTER — Other Ambulatory Visit: Payer: Self-pay | Admitting: Internal Medicine

## 2020-04-29 ENCOUNTER — Other Ambulatory Visit: Payer: Self-pay

## 2020-04-29 ENCOUNTER — Ambulatory Visit (AMBULATORY_SURGERY_CENTER): Payer: Self-pay

## 2020-04-29 VITALS — Ht 67.5 in | Wt 212.0 lb

## 2020-04-29 DIAGNOSIS — Z01818 Encounter for other preprocedural examination: Secondary | ICD-10-CM

## 2020-04-29 DIAGNOSIS — Z1211 Encounter for screening for malignant neoplasm of colon: Secondary | ICD-10-CM

## 2020-04-29 MED ORDER — CLENPIQ 10-3.5-12 MG-GM -GM/160ML PO SOLN: 1.0000 | ORAL | 0 refills | Status: DC

## 2020-04-29 NOTE — Progress Notes (Signed)
No egg or soy allergy known to patient  No issues with past sedation with any surgeries or procedures-2014-myomectomy "they had a hard time waking me up, no other problems"; No intubation problems in the past  No diet pills per patient No home 02 use per patient  No blood thinners per patient  Pt denies issues with constipation  No A fib or A flutter  EMMI video to [place] 19 guidelines implemented in [facility] today   Coupon given to pt in [facility] today   COVID screening scheduled on 05/11/2020 at 10:10 am;   Due to the COVID-19 pandemic we are asking patients to follow these guidelines. Please only bring one care partner. Please be aware that your care partner may wait in the car in the parking lot or if they feel like they will be too hot to wait in the car, they may wait in the lobby on the 4th floor. All care partners are required to wear a mask the entire time (we do not have any that we can provide them), they need to practice social distancing, and we will do a Covid check for all patient's and care partners when you arrive. Also we will check their temperature and your temperature. If the care partner waits in their car they need to stay in the parking lot the entire time and we will call them on their cell phone when the patient is ready for discharge so they can bring the car to the front of the building. Also all patient's will need to wear a mask into building.

## 2020-04-30 ENCOUNTER — Encounter: Payer: Self-pay | Admitting: Gastroenterology

## 2020-05-11 ENCOUNTER — Other Ambulatory Visit: Payer: Self-pay | Admitting: Gastroenterology

## 2020-05-11 ENCOUNTER — Ambulatory Visit (INDEPENDENT_AMBULATORY_CARE_PROVIDER_SITE_OTHER): Payer: BLUE CROSS/BLUE SHIELD

## 2020-05-11 DIAGNOSIS — Z1159 Encounter for screening for other viral diseases: Secondary | ICD-10-CM | POA: Diagnosis not present

## 2020-05-12 ENCOUNTER — Encounter: Payer: Self-pay | Admitting: Certified Registered Nurse Anesthetist

## 2020-05-12 LAB — SARS CORONAVIRUS 2 (TAT 6-24 HRS): SARS Coronavirus 2: NEGATIVE

## 2020-05-13 ENCOUNTER — Encounter: Payer: Self-pay | Admitting: Gastroenterology

## 2020-05-13 ENCOUNTER — Other Ambulatory Visit: Payer: Self-pay

## 2020-05-13 ENCOUNTER — Ambulatory Visit (AMBULATORY_SURGERY_CENTER): Payer: BLUE CROSS/BLUE SHIELD | Admitting: Gastroenterology

## 2020-05-13 VITALS — BP 105/90 | HR 62 | Temp 97.8°F | Resp 18 | Ht 67.0 in | Wt 212.0 lb

## 2020-05-13 DIAGNOSIS — D125 Benign neoplasm of sigmoid colon: Secondary | ICD-10-CM | POA: Diagnosis not present

## 2020-05-13 DIAGNOSIS — Z1211 Encounter for screening for malignant neoplasm of colon: Secondary | ICD-10-CM

## 2020-05-13 DIAGNOSIS — K641 Second degree hemorrhoids: Secondary | ICD-10-CM

## 2020-05-13 DIAGNOSIS — K5730 Diverticulosis of large intestine without perforation or abscess without bleeding: Secondary | ICD-10-CM

## 2020-05-13 MED ORDER — SODIUM CHLORIDE 0.9 % IV SOLN
500.0000 mL | Freq: Once | INTRAVENOUS | Status: DC
Start: 2020-05-13 — End: 2020-05-13

## 2020-05-13 NOTE — Patient Instructions (Signed)
Please read handouts provided. Continue present medications. Await pathology results. Return to GI clinic as needed. Use fiber supplements.      YOU HAD AN ENDOSCOPIC PROCEDURE TODAY AT [facility]:   Refer to the procedure report that was given to you for any specific questions about what was found during the examination.  If the procedure report does not answer your questions, please call your gastroenterologist to clarify.  If you requested that your care partner not be given the details of your procedure findings, then the procedure report has been included in a sealed envelope for you to review at your convenience later.  YOU SHOULD EXPECT: Some feelings of bloating in the abdomen. Passage of more gas than usual.  Walking can help get rid of the air that was put into your GI tract during the procedure and reduce the bloating. If you had a lower endoscopy (such as a colonoscopy or flexible sigmoidoscopy) you may notice spotting of blood in your stool or on the toilet paper. If you underwent a bowel prep for your procedure, you may not have a normal bowel movement for a few days.  Please Note:  You might notice some irritation and congestion in your nose or some drainage.  This is from the oxygen used during your procedure.  There is no need for concern and it should clear up in a day or so.  SYMPTOMS TO REPORT IMMEDIATELY:   Following lower endoscopy (colonoscopy or flexible sigmoidoscopy):  Excessive amounts of blood in the stool  Significant tenderness or worsening of abdominal pains  Swelling of the abdomen that is new, acute  Fever of 100F or higher   For urgent or emergent issues, a gastroenterologist can be reached at any hour by calling [phone]. Do not use MyChart messaging for urgent concerns.    DIET:  We do recommend a small meal at first, but then you may proceed to your regular diet.  Drink plenty of fluids but you should avoid alcoholic  beverages for 24 hours.  ACTIVITY:  You should plan to take it easy for the rest of today and you should NOT DRIVE or use heavy machinery until tomorrow (because of the sedation medicines used during the test).    FOLLOW UP: Our staff will call the number listed on your records 48-72 hours following your procedure to check on you and address any questions or concerns that you may have regarding the information given to you following your procedure. If we do not reach you, we will leave a message.  We will attempt to reach you two times.  During this call, we will ask if you have developed any symptoms of COVID 19. If you develop any symptoms (ie: fever, flu-like symptoms, shortness of breath, cough etc.) before then, please call [phone].  If you test positive for Covid 19 in the 2 weeks post procedure, please call and report this information to [place].    If any biopsies were taken you will be contacted by phone or by letter within the next 1-3 weeks.  Please call us at [phone] if you have not heard about the biopsies in 3 weeks.    SIGNATURES/CONFIDENTIALITY: You and/or your care partner have signed paperwork which will be entered into your electronic medical record.  These signatures attest to the fact that that the information above on your After Visit Summary has been reviewed and is understood.  Full responsibility of the confidentiality of this discharge information  lies with you and/or your care-partner.

## 2020-05-13 NOTE — Progress Notes (Signed)
Called to room to assist during endoscopic procedure.  Patient ID and intended procedure confirmed with present staff. Received instructions for my participation in the procedure from the performing physician.  

## 2020-05-13 NOTE — Progress Notes (Signed)
Report given to PACU, vss 

## 2020-05-13 NOTE — Op Note (Signed)
[place] Patient Name: Pamela [name] Procedure Date: 05/13/2020 9:31 AM MRN: [id number] Endoscopist: Gerrit Heck , MD Age: 49 Referring MD:  Date of Birth: 15-Nov-1970 Gender: Female Account #: [id number] Procedure:                Colonoscopy Indications:              Screening for colorectal malignant neoplasm, This                            is the patient's first colonoscopy Medicines:                Monitored Anesthesia Care Procedure:                Pre-Anesthesia Assessment:                           - Prior to the procedure, a History and Physical                            was performed, and patient medications and                            allergies were reviewed. The patient's tolerance of                            previous anesthesia was also reviewed. The risks                            and benefits of the procedure and the sedation                            options and risks were discussed with the patient.                            All questions were answered, and informed consent                            was obtained. Prior Anticoagulants: The patient has                            taken no previous anticoagulant or antiplatelet                            agents. ASA Grade Assessment: II - A patient with                            mild systemic disease. After reviewing the risks                            and benefits, the patient was deemed in                            satisfactory condition to undergo the procedure.  After obtaining informed consent, the colonoscope                            was passed under direct vision. Throughout the                            procedure, the patient's blood pressure, pulse, and                            oxygen saturations were monitored continuously. The                            Colonoscope was introduced through the anus and                            advanced to the the  terminal ileum. The colonoscopy                            was performed without difficulty. The patient                            tolerated the procedure well. The quality of the                            bowel preparation was good. The terminal ileum,                            ileocecal valve, appendiceal orifice, and rectum                            were photographed. Scope In: 9:34:58 AM Scope Out: 9:49:29 AM Scope Withdrawal Time: 0 hours 10 minutes 53 seconds  Total Procedure Duration: 0 hours 14 minutes 31 seconds  Findings:                 Hemorrhoids were found on perianal exam.                           A 3 mm polyp was found in the sigmoid colon. The                            polyp was sessile. The polyp was removed with a                            cold biopsy forceps. Resection and retrieval were                            complete. Estimated blood loss was minimal.                           A few small-mouthed diverticula were found in the                            sigmoid colon.  Non-bleeding internal hemorrhoids were found during                            retroflexion. The hemorrhoids were small and Grade                            II (internal hemorrhoids that prolapse but reduce                            spontaneously).                           The terminal ileum appeared normal. Complications:            No immediate complications. Estimated Blood Loss:     Estimated blood loss was minimal. Impression:               - Hemorrhoids found on perianal exam.                           - One 3 mm polyp in the sigmoid colon, removed with                            a cold biopsy forceps. Resected and retrieved.                           - Diverticulosis in the sigmoid colon.                           - Non-bleeding internal hemorrhoids.                           - The examined portion of the ileum was normal. Recommendation:           -  Patient has a contact number available for                            emergencies. The signs and symptoms of potential                            delayed complications were discussed with the                            patient. Return to normal activities tomorrow.                            Written discharge instructions were provided to the                            patient.                           - Resume previous diet.                           - Continue present medications.                           -  Await pathology results.                           - Repeat colonoscopy in 7-10 years for surveillance                            based on pathology results.                           - Return to GI clinic PRN.                           - Use fiber, for example Citrucel, Fibercon, Konsyl                            or Metamucil.                           - Internal hemorrhoids were noted on this study and                            may be amenable to hemorrhoid band ligation. If you                            are interested in further treatment of these                            hemorrhoids with band ligation, please contact my                            clinic to set up an appointment for evaluation and                            treatment. Gerrit Heck, MD 05/13/2020 9:56:31 AM

## 2020-05-13 NOTE — Progress Notes (Signed)
Pt's states no medical or surgical changes since previsit or office visit. VS by CW. 

## 2020-05-15 ENCOUNTER — Telehealth: Payer: Self-pay | Admitting: *Deleted

## 2020-05-15 NOTE — Telephone Encounter (Signed)
Message left

## 2020-05-15 NOTE — Telephone Encounter (Signed)
  Follow up Call-  Call back number 05/13/2020  Post procedure Call Back phone  # [phone]  Permission to leave phone message Yes  Some recent data might be hidden   [facility]

## 2020-05-28 ENCOUNTER — Encounter: Payer: Self-pay | Admitting: Gastroenterology

## 2020-06-03 ENCOUNTER — Other Ambulatory Visit: Payer: BLUE CROSS/BLUE SHIELD

## 2020-07-08 ENCOUNTER — Other Ambulatory Visit: Payer: BLUE CROSS/BLUE SHIELD

## 2020-08-24 DIAGNOSIS — Z20822 Contact with and (suspected) exposure to covid-19: Secondary | ICD-10-CM | POA: Diagnosis not present

## 2020-08-24 DIAGNOSIS — R509 Fever, unspecified: Secondary | ICD-10-CM | POA: Diagnosis not present

## 2020-08-30 DIAGNOSIS — R0902 Hypoxemia: Secondary | ICD-10-CM | POA: Diagnosis not present

## 2020-08-30 DIAGNOSIS — J1282 Pneumonia due to coronavirus disease 2019: Secondary | ICD-10-CM | POA: Diagnosis not present

## 2020-08-30 DIAGNOSIS — Z87891 Personal history of nicotine dependence: Secondary | ICD-10-CM | POA: Diagnosis not present

## 2020-08-30 DIAGNOSIS — U071 COVID-19: Secondary | ICD-10-CM | POA: Diagnosis not present

## 2020-08-30 DIAGNOSIS — R0602 Shortness of breath: Secondary | ICD-10-CM | POA: Diagnosis not present

## 2020-08-30 DIAGNOSIS — E039 Hypothyroidism, unspecified: Secondary | ICD-10-CM | POA: Diagnosis not present

## 2020-08-30 DIAGNOSIS — J189 Pneumonia, unspecified organism: Secondary | ICD-10-CM | POA: Diagnosis not present

## 2020-08-30 DIAGNOSIS — E890 Postprocedural hypothyroidism: Secondary | ICD-10-CM | POA: Diagnosis not present

## 2020-08-30 DIAGNOSIS — R197 Diarrhea, unspecified: Secondary | ICD-10-CM | POA: Diagnosis not present

## 2020-08-30 DIAGNOSIS — R109 Unspecified abdominal pain: Secondary | ICD-10-CM | POA: Diagnosis not present

## 2020-08-30 DIAGNOSIS — A0839 Other viral enteritis: Secondary | ICD-10-CM | POA: Diagnosis not present

## 2020-08-30 DIAGNOSIS — E876 Hypokalemia: Secondary | ICD-10-CM | POA: Diagnosis not present

## 2020-09-03 DIAGNOSIS — R200 Anesthesia of skin: Secondary | ICD-10-CM | POA: Diagnosis not present

## 2020-09-03 DIAGNOSIS — R251 Tremor, unspecified: Secondary | ICD-10-CM | POA: Diagnosis not present

## 2020-09-08 ENCOUNTER — Other Ambulatory Visit: Payer: Self-pay

## 2020-09-08 ENCOUNTER — Telehealth: Payer: BLUE CROSS/BLUE SHIELD | Admitting: Family Medicine

## 2020-09-10 ENCOUNTER — Other Ambulatory Visit: Payer: Self-pay

## 2020-09-10 ENCOUNTER — Telehealth: Payer: BLUE CROSS/BLUE SHIELD | Admitting: Family Medicine

## 2020-09-16 ENCOUNTER — Ambulatory Visit: Payer: BLUE CROSS/BLUE SHIELD | Admitting: Family Medicine

## 2020-09-16 DIAGNOSIS — Z0289 Encounter for other administrative examinations: Secondary | ICD-10-CM

## 2020-11-08 ENCOUNTER — Other Ambulatory Visit: Payer: Self-pay | Admitting: Internal Medicine

## 2020-11-22 DIAGNOSIS — M545 Low back pain, unspecified: Secondary | ICD-10-CM | POA: Diagnosis not present

## 2021-03-11 ENCOUNTER — Other Ambulatory Visit: Payer: Self-pay

## 2021-03-11 ENCOUNTER — Encounter: Payer: Self-pay | Admitting: Internal Medicine

## 2021-03-11 ENCOUNTER — Ambulatory Visit (INDEPENDENT_AMBULATORY_CARE_PROVIDER_SITE_OTHER): Payer: BLUE CROSS/BLUE SHIELD | Admitting: Internal Medicine

## 2021-03-11 VITALS — BP 128/78 | Ht 67.0 in | Wt 218.6 lb

## 2021-03-11 DIAGNOSIS — C73 Malignant neoplasm of thyroid gland: Secondary | ICD-10-CM | POA: Diagnosis not present

## 2021-03-11 DIAGNOSIS — E890 Postprocedural hypothyroidism: Secondary | ICD-10-CM

## 2021-03-11 DIAGNOSIS — E559 Vitamin D deficiency, unspecified: Secondary | ICD-10-CM | POA: Diagnosis not present

## 2021-03-11 DIAGNOSIS — R7303 Prediabetes: Secondary | ICD-10-CM | POA: Diagnosis not present

## 2021-03-11 DIAGNOSIS — E892 Postprocedural hypoparathyroidism: Secondary | ICD-10-CM | POA: Diagnosis not present

## 2021-03-11 LAB — HEMOGLOBIN A1C: Hgb A1c MFr Bld: 6.4 % (ref 4.6–6.5)

## 2021-03-11 NOTE — Patient Instructions (Addendum)
Please continue: - vitamin D 5000 units daily - calcium 500 mg daily with dinner   Please continue levothyroxine 112 mcg daily.  Take the thyroid hormone every day, with water, at least 30 minutes before breakfast, separated by at least 4 hours from: - acid reflux medications - calcium - iron - multivitamins  Please stop at the lab.  Please come back for a follow-up appointment in 1 year.  Please consider the following ways to cut down carbs and fat and increase fiber and micronutrients in your diet: - substitute whole grain for white bread or pasta - substitute brown rice for white rice - substitute 90-calorie flat bread pieces for slices of bread when possible - substitute sweet potatoes or yams for white potatoes - substitute humus for margarine - substitute tofu for cheese when possible - substitute almond or rice milk for regular milk (would not drink soy milk daily due to concern for soy estrogen influence on breast cancer risk) - substitute dark chocolate for other sweets when possible - substitute water - can add lemon or orange slices for taste - for diet sodas (artificial sweeteners will trick your body that you can eat sweets without getting calories and will lead you to overeating and weight gain in the long run) - do not skip breakfast or other meals (this will slow down the metabolism and will result in more weight gain over time)  - can try smoothies made from fruit and almond/rice milk in am instead of regular breakfast - can also try old-fashioned (not instant) oatmeal made with almond/rice milk in am - order the dressing on the side when eating salad at a restaurant (pour less than half of the dressing on the salad) - eat as little meat as possible - can try juicing, but should not forget that juicing will get rid of the fiber, so would alternate with eating raw veg./fruits or drinking smoothies - use as little oil as possible, even when using olive oil - can dress a  salad with a mix of balsamic vinegar and lemon juice, for e.g. - use agave nectar, stevia sugar, or regular sugar rather than artificial sweateners - steam or broil/roast veggies  - snack on veggies/fruit/nuts (unsalted, preferably) when possible, rather than processed foods - reduce or eliminate aspartame in diet (it is in diet sodas, chewing gum, etc) Read the labels!  Try to read Dr. Janene Harvey book: "Program for Reversing Diabetes" for other ideas for healthy eating.

## 2021-03-11 NOTE — Progress Notes (Signed)
Patient ID: Pamela [name], female   DOB: 1971/01/10, 50 y.o.   MRN: [id number]  This visit occurred during the SARS-CoV-2 public health emergency.  Safety protocols were in place, including screening questions prior to the visit, additional usage of staff PPE, and extensive cleaning of exam room while observing appropriate contact time as indicated for disinfecting solutions.   HPI  Pamela [name] is a 50 y.o.-year-old female, initially referred by Dr. Dellis Filbert, presenting for follow-up for h/o papillary thyroid cancer,postsurgical hypothyroidism and also postsurgical hypoparathyroidism. Last visit 1 year ago.  Interim history: She had Covid19 in 08/2020 >> resolved except change in taste. Otherwise, she feels well, without other symptoms except for mild weight gain. For the last 2 to 3 days he missed her calcium tablets.  As she ran out she started to develop tingling.  She took a calcium tablet this morning and the tingling sensation disappeared.  Reviewed her PTC cancer history: She had an exam with ObGyn on 12/15/2014 >> Dr. Dellis Filbert felt a lump in neck.  Thyroid U/S (02/23/2015): Hypoechoic, solid right-sided isthmus nodule measures approximately 1.4 x 0.6 x 1.0 cm. There are a few scattered areas of peripheral calcification in this nodule.    FNA (02/25/2015): FINDINGS CONSISTENT WITH PAPILLARY CARCINOMA ([place] CATEGORY VI).  Thyroidectomy (03/12/2015): 1. Thyroid, thyroidectomy, total thyroid sutures right lobe - MULTIFOCAL PAPILLARY THYROID CARCINOMA, TWO FOCI MEASURING 1.1 CM AND 0.6 CM IN GREATEST DIMENSION. - MARGINS ARE NEGATIVE. - ONE BENIGN LYMPH NODE WITH NO TUMOR SEEN (0/1). - SEE ONCOLOGY TEMPLATE. ADDITIONAL FINDINGS: - INCIDENTAL BENIGN PARATHYROID TISSUE (0.3 CM). 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 2. Lymph nodes, regional resection, central compartment - THREE BENIGN LYMPH NODES WITH NO TUMOR SEEN (0/3). - INCIDENTAL BENIGN THYMIC TISSUE.  1.  THYROID Specimen: Total thyroid with central compartment lymph nodes. Procedure: Total thyroidectomy with central compartment lymph node dissection. Specimen Integrity (intact/fragmented): Intact. Tumor focality: Multifocal, two foci.  Dominant tumor: Maximum tumor size (cm): 1.1 cm Tumor laterality: Isthmus (central). Histologic type (including subtype and/or unique features as applicable): Papillary thyroid carcinoma, conventional type. Tumor capsule: No tumor capsule identified. Extrathyroidal extension: No. Margins: Negative. Lymph - Vascular invasion: Not identified. Capsular invasion with degree of invasion if present: Not applicable.  Second tumor: Tumor size(s): 0.6 cm Tumor laterality: Right superior. Histologic type (including subtype and/or unique features as applicable): Papillary thyroid carcinoma, conventional type. Tumor capsule: No tumor capsule identified. Extrathyroidal extension: No. Margins: Negative. Lymph - Vascular invasion: Not identified. Capsular invasion with degree of invasion if present: Not applicable. Lymph nodes: # examined 4; # positive; 0.  She did not have RAI treatment due to small sizes of the tumors.  04/11/2016: Neck U/S:  No evidence of tissue in the right or left thyroid beds to suggest recurrence of residual thyroid tissue.  No evidence of abnormal adenopathy.  04/06/2020: Neck U/S:  No abnormal masses in the neck, no signs of cancer recurrence.  Her thyroglobulin's are low and her globulin antibodies are not elevated: Lab Results  Component Value Date   THYROGLB 0.2 (L) 03/10/2020   THYROGLB 0.2 (L) 02/14/2018   THYROGLB 0.2 (L) 02/14/2017   THYROGLB 0.1 (L) 03/17/2016   THYROGLB 0.2 (L) 09/18/2015   Lab Results  Component Value Date   THGAB <1 03/10/2020   THGAB <1 02/14/2018   THGAB <1 02/14/2017   THGAB <1 03/17/2016   THGAB <1 09/18/2015   Pt denies: - feeling nodules in neck - hoarseness - dysphagia - choking -  SOB with lying down  Reviewed her TFTs: Lab Results  Component Value Date   TSH 2.30 03/10/2020   TSH 1.750 03/29/2018   TSH 0.29 (L) 02/14/2018   TSH 0.74 01/04/2018   TSH 0.736 06/23/2017   TSH 0.127 (L) 05/23/2017   TSH 1.06 02/14/2017   FREET4 0.95 03/10/2020   FREET4 1.35 03/29/2018   FREET4 1.12 02/14/2018   FREET4 1.00 01/04/2018   FREET4 1.42 06/23/2017   FREET4 1.97 (H) 05/23/2017   FREET4 1.07 02/14/2017  Received labs from Dr. Ronita Hipps, from 08/30/2017: - TSH 0.58, free T4 1.42, both at goal  Pt is on levothyroxine 112 mcg daily, taken: - in am - fasting - at least 30 min from b'fast - + Ca at night, no Fe, + MVI at night, no PPIs - not on Biotin  She  also has a history of HTN during previous pregnancy >> son born 10/2014.  She had IVF in 2018 >> healthy baby girl born 11/2017.   At last visit, she complained about tingling in her feet.  Vitamin B12 level was normal: Lab Results  Component Value Date   VITAMINB12 401 02/14/2018   We started: -Calcium 500 mg with dinner >> then stopped, only take it as needed, 1-2 times a month >> now daily -Vitamin D 5000 units daily >> then 2500 units daily >> 5000 units daily  Reviewed pertinent labs: Lab Results  Component Value Date   PTH 14 (L) 03/10/2020   PTH Comment 03/10/2020   PTH 13 (L) 02/14/2018   PTH Comment 02/14/2018   CALCIUM 7.7 (L) 03/18/2020   CALCIUM 8.0 (L) 03/10/2020   CALCIUM 8.9 03/29/2018   CALCIUM 7.9 (L) 02/14/2018   CALCIUM 7.8 (L) 11/24/2017   CALCIUM 8.7 04/17/2015   CALCIUM 8.3 (L) 03/31/2015   CALCIUM 8.4 (L) 03/29/2015   CALCIUM 8.0 (L) 03/20/2015   CALCIUM 8.6 (L) 03/13/2015   Lab Results  Component Value Date   VD25OH 44.63 03/10/2020   VD25OH 46.4 03/29/2018   VD25OH 19.27 (L) 02/14/2018   She mentions that she has a history of gestational diabetes but she does not remember with range of her pregnancies.  Most recent HbA1c was: Lab Results  Component Value Date   HGBA1C  6.4 03/18/2020    ROS: Constitutional: + weight gain/no weight loss, no fatigue, no subjective hyperthermia, no subjective hypothermia Eyes: no blurry vision, no xerophthalmia ENT: no sore throat, + see HPI Cardiovascular: no CP/no SOB/no palpitations/no leg swelling Respiratory: no cough/no SOB/no wheezing Gastrointestinal: no N/no V/no D/no C/no acid reflux Musculoskeletal: no muscle aches/joint aches Skin: no rashes, no hair loss Neurological: no tremors/no numbness/+ tingling/no dizziness  I reviewed pt's medications, allergies, PMH, social hx, family hx, and changes were documented in the history of present illness. Otherwise, unchanged from my initial visit note.  Past Medical History:  Diagnosis Date  . Allergy    seasonal allergies  . Arthritis    osteoarthritis in bilateral hands  . Complication of anesthesia    trouble waking up after second myomectomy  . Depression    hx of situational depression  . Dermatitis   . Diabetes mellitus without complication ([place])   . Dysrhythmia    resolved- "grew out of it" - no problem since teenager  . Genital warts    removed  . GERD (gastroesophageal reflux disease)    hx-bacterial ulcer in 2004  . Gestational diabetes   . Hypertension 2016   [place] with 2016 pregnancy /  no BP meds since birth of child  . Lichen sclerosus   . Newborn product of in vitro fertilization (IVF) pregnancy   . Pregnancy induced hypertension   . Termination of pregnancy (fetus)    x 1  . Thyroid cancer ([place])    thyroid CA in 2016-sx   Past Surgical History:  Procedure Laterality Date  . BLADDER SURGERY  1992   Widened the urethra  . CESAREAN SECTION N/A 11/04/2014   Procedure: CESAREAN SECTION;  Surgeon: [name], MD;  Location: [facility];  Service: Obstetrics;  Laterality: N/A;  EDD: 11/23/14  . CESAREAN SECTION N/A 11/24/2017   Procedure: Repeat CESAREAN SECTION;  Surgeon: Brien Few, MD;  Location: [facility];  Service:  Obstetrics;  Laterality: N/A;  EDD: 12/14/17 Allergy: Erythromycin, Augmentin  . LYMPH NODE DISSECTION  03/12/2015   Procedure: LYMPH NODE DISSECTION;  Surgeon: Armandina Gemma, MD;  Location: [facility];  Service: General;;  . myomectomy  2008  . MYOMECTOMY  2014   Removed tubes bilaterally  . MYOMECTOMY N/A 11/04/2014   Procedure: MYOMECTOMY;  Surgeon: [name], MD;  Location: [facility];  Service: Obstetrics;  Laterality: N/A;  . THYROIDECTOMY N/A 03/12/2015   Procedure: TOTAL THYROIDECTOMY;  Surgeon: Armandina Gemma, MD;  Location: [facility];  Service: General;  Laterality: N/A;  . TUBAL LIGATION    . WISDOM TOOTH EXTRACTION     History   Social History  . Marital Status: Married    Spouse Name: N/A  . Number of Children: 1   Occupational History  .  accountant    Social History Main Topics  . Smoking status: Former Smoker -- 1.00 packs/day for 20 years    Types: Cigarettes    Quit date: 08/11/2007  . Smokeless tobacco: Never Used  . Alcohol Use: Yes     Comment: Beer/liquor 1 drink once a week   . Drug Use: Yes    Special: Marijuana, Cocaine     Comment: 25 years ago "Acid"  , last use cocaine/marijuana 25 yrs ago   Current Outpatient Medications on File Prior to Visit  Medication Sig Dispense Refill  . clobetasol ointment (TEMOVATE) 8.31 % Apply 1 application topically 2 (two) times daily.    Marland Kitchen SYNTHROID 112 MCG tablet TAKE 1 TABLET BY MOUTH DAILY BEFORE BREAKFAST 90 tablet 3   No current facility-administered medications on file prior to visit.   Allergies  Allergen Reactions  . Augmentin [Amoxicillin-Pot Clavulanate] Nausea And Vomiting  . Erythromycin Nausea And Vomiting   No family history available. Patient is adopted.  She has lichen sclerosus >> on Clobetasol.  PE: BP 128/78 (BP Location: Right Arm, Patient Position: Sitting, Cuff Size: Normal)   Ht '5\' 7"'  (1.702 m)   Wt 218 lb 9.6 oz (99.2 kg)   BMI 34.24 kg/m  Body mass index is 34.24 kg/m. Pulse: 62 bpm Wt  Readings from Last 3 Encounters:  03/11/21 218 lb 9.6 oz (99.2 kg)  05/13/20 212 lb (96.2 kg)  04/29/20 212 lb (96.2 kg)   Constitutional: overweight, in NAD Eyes: PERRLA, EOMI, no exophthalmos ENT: moist mucous membranes, no thyromegaly, no cervical lymphadenopathy Cardiovascular: RRR, No MRG Respiratory: CTA B Gastrointestinal: abdomen soft, NT, ND, BS+ Musculoskeletal: no deformities, strength intact in all 4 Skin: moist, warm, no rashes Neurological: no tremor with outstretched hands, DTR normal in all 4  ASSESSMENT: 1. Bifocal papillary thyroid cancer  2. Postsurgical hypothyroidism  3.  Postsurgical hypoparathyroidism  4.  Vitamin D deficiency  PLAN: 1. Papillary thyroid cancer -Reviewed her papillary thyroid cancer features Not encapsulated Multifocal, but the second focus was a micro PTC No extension in the lymph or blood vessels  Margins free of disease Latest neck ultrasound from 2017 showed no recurrence or metastasis in the neck Overall, she is stage I PTC, which has a very good prognosis >> no RAI treatment was necessary -After last visit, we repeated a neck ultrasound (04/06/2020): No suspicious masses in the neck, no evidence of recurrence -Thyroglobulin levels remain low-0.2 in 2021, as expected without RAI treatment, antithyroglobulin antibodies were undetectable -At today's visit we will recheck her thyroglobulin and ATA -I will see her back in 1 year  2. Postsurgical hypothyroidism - latest thyroid labs reviewed with pt. >> normal: Lab Results  Component Value Date   TSH 2.30 03/10/2020  - she continues on LT4 112 mcg daily - pt feels good on this dose. - we discussed about taking the thyroid hormone every day, with water, >30 minutes before breakfast, separated by >4 hours from acid reflux medications, calcium, iron, multivitamins. Pt. is taking it correctly. - will check thyroid tests today: TSH and fT4 - If labs are abnormal, she will need to return  for repeat TFTs in 1.5 months  3.  Postsurgical hypoparathyroidism and 4. vitamin D deficiency -Her calcium, PTH, and vitamin D levels were all normal in 2019.  Since then, she came off her daily calcium supplement after running out.  She continues on 5000 units vitamin D daily. -At last visit, calcium was 8.0, lower than normal, and a PTH is also suppressed, at 14.  Vitamin D level was normal. -I advised her to restart calcium 500 mg with dinner and to continue 5000 units vitamin D daily. -Latest labs are from 09/03/2020: Normal magnesium, at 1.8 (1.8-2.4), low calcium at 7.6 (8.5-10.5). -At today's visit, she denies numbness/tingling/acral cramping when taking calcium consistently but she had tingling today after she missed calcium for 2 to 3 days.  We discussed about the importance of taking it every day. -We will recheck her calcium and vitamin D level today  4. Prediabetes - latest HbA1c 6.4%, consistent with prediabetes, but I explained that without the second HbA1c in the prediabetic range, we cannot clearly diagnose prediabetes -She has a history of gestational diabetes with one of her pregnancies -She would want me to recheck her HbA1c-we will do so today -Discussed about first-line for treatment being changes in diet and also increased exercise -Recommended dietary changes and weight loss  Orders Placed This Encounter  Procedures  . TSH  . T4, free  . VITAMIN D 25 Hydroxy (Vit-D Deficiency, Fractures)  . Calcium  . Thyroglobulin Level  . Thyroglobulin antibody  . Hemoglobin A1c   Component     Latest Ref Rng & Units 03/11/2021  Thyroglobulin     ng/mL 0.3 (L)  Comment        T4,Free(Direct)     0.60 - 1.60 ng/dL 1.09  TSH     0.35 - 4.50 uIU/mL 7.04 (H)  Thyroglobulin Ab     < or = 1 IU/mL <1  Calcium     8.4 - 10.5 mg/dL 7.5 (L)  Vitamin D, 25-Hydroxy     30.0 - 100.0 ng/mL 31.8  Hemoglobin A1C     4.6 - 6.5 % 6.4   Vitamin D normal. HbA1c high in the  prediabetic range. TSH high. Will increase LT4 dose to 125 mcg daily. Will repeat her TFTs in  1.5 mo. Tg also slightly higher, most likely 2/2 elevated TSH. Calcium low >> will recommend to start taking calcium daily at dinnertime. She did mention that she ran out 2-3 days ago. Will repeat this in 1.5 mo.  Philemon Kingdom, MD PhD [facility]

## 2021-03-12 LAB — CALCIUM: Calcium: 7.5 mg/dL — ABNORMAL LOW (ref 8.4–10.5)

## 2021-03-12 LAB — T4, FREE: Free T4: 1.09 ng/dL (ref 0.60–1.60)

## 2021-03-12 LAB — THYROGLOBULIN LEVEL: Thyroglobulin: 0.3 ng/mL — ABNORMAL LOW

## 2021-03-12 LAB — THYROGLOBULIN ANTIBODY: Thyroglobulin Ab: <1 IU/mL (ref <=1)

## 2021-03-12 LAB — TSH: TSH: 7.04 u[IU]/mL — ABNORMAL HIGH (ref 0.35–4.50)

## 2021-03-12 LAB — VITAMIN D 25 HYDROXY (VIT D DEFICIENCY, FRACTURES): Vit D, 25-Hydroxy: 31.8 ng/mL (ref 30.0–100.0)

## 2021-03-14 ENCOUNTER — Encounter: Payer: Self-pay | Admitting: Internal Medicine

## 2021-03-14 MED ORDER — SYNTHROID 125 MCG PO TABS: 125.0000 ug | ORAL_TABLET | Freq: Every day | ORAL | 3 refills | Status: DC

## 2021-03-15 ENCOUNTER — Encounter: Payer: Self-pay | Admitting: Internal Medicine

## 2021-03-19 ENCOUNTER — Encounter: Payer: BLUE CROSS/BLUE SHIELD | Admitting: Family Medicine

## 2021-03-22 DIAGNOSIS — N888 Other specified noninflammatory disorders of cervix uteri: Secondary | ICD-10-CM | POA: Diagnosis not present

## 2021-03-22 DIAGNOSIS — B373 Candidiasis of vulva and vagina: Secondary | ICD-10-CM | POA: Diagnosis not present

## 2021-03-22 DIAGNOSIS — Z01419 Encounter for gynecological examination (general) (routine) without abnormal findings: Secondary | ICD-10-CM | POA: Diagnosis not present

## 2021-03-22 DIAGNOSIS — L900 Lichen sclerosus et atrophicus: Secondary | ICD-10-CM | POA: Diagnosis not present

## 2021-08-20 DIAGNOSIS — Z1231 Encounter for screening mammogram for malignant neoplasm of breast: Secondary | ICD-10-CM | POA: Diagnosis not present

## 2021-08-20 LAB — HM MAMMOGRAPHY

## 2021-08-23 ENCOUNTER — Encounter: Payer: Self-pay | Admitting: Family Medicine

## 2021-10-08 ENCOUNTER — Ambulatory Visit (INDEPENDENT_AMBULATORY_CARE_PROVIDER_SITE_OTHER): Payer: BC Managed Care – PPO | Admitting: Family Medicine

## 2021-10-08 ENCOUNTER — Encounter: Payer: Self-pay | Admitting: Family Medicine

## 2021-10-08 VITALS — BP 128/86 | HR 60 | Temp 98.1°F | Ht 68.0 in | Wt 213.4 lb

## 2021-10-08 DIAGNOSIS — F411 Generalized anxiety disorder: Secondary | ICD-10-CM

## 2021-10-08 DIAGNOSIS — F321 Major depressive disorder, single episode, moderate: Secondary | ICD-10-CM

## 2021-10-08 MED ORDER — SERTRALINE HCL 50 MG PO TABS: 50.0000 mg | ORAL_TABLET | Freq: Every day | ORAL | 3 refills | Status: DC

## 2021-10-08 MED ORDER — SERTRALINE HCL 50 MG PO TABS: 50.0000 mg | ORAL_TABLET | Freq: Every day | ORAL | 0 refills | Status: DC

## 2021-10-08 NOTE — Patient Instructions (Signed)
Aim to do some physical exertion for 150 minutes per week. This is typically divided into 5 days per week, 30 minutes per day. The activity should be enough to get your heart rate up. Anything is better than nothing if you have time constraints.  Please consider counseling. Contact [phone] to schedule an appointment or inquire about cost/insurance coverage.  [facility] located at [place], [place], [place].  Phone number = [phone].  Dr. Lennice Sites - Adult Psychiatry.    [facility] located at [place], [place], [place]. Phone number = [phone].   [facility] located at [address], [place], [place].  Phone number = [phone].   [facility] located at [place], [place], [place].  Phone number = [phone].  Coping skills Choose 5 that work for you: Take a deep breath Count to 20 Read a book Do a puzzle Meditate Bake Sing Knit Garden Pray Go outside Call a friend Listen to music Take a walk Color Send a note Take a bath Watch a movie Be alone in a quiet place Pet an animal Visit a friend Journal Exercise Stretch   Let us know if you need anything.

## 2021-10-08 NOTE — Progress Notes (Signed)
Chief Complaint  Patient presents with   Depression   Cough    Subjective Pamela [name] presents for f/u anxiety/depression.  Pt is currently being treated with nothing.  No thoughts of harming self or others. No self-medication with alcohol, prescription drugs or illicit drugs. Did well w Zoloft. She was on Lexapro, Wellbutrin and Effexor also with not significant improvement. Not currently exercising.  Pt is not following with a counselor/psychologist.  Past Medical History:  Diagnosis Date   Allergy    seasonal allergies   Arthritis    osteoarthritis in bilateral hands   Complication of anesthesia    trouble waking up after second myomectomy   Depression    hx of situational depression   Dermatitis    Diabetes mellitus without complication ([place])    Dysrhythmia    resolved- "grew out of it" - no problem since teenager   Genital warts    removed   GERD (gastroesophageal reflux disease)    hx-bacterial ulcer in 2004   Gestational diabetes    Hypertension 2016   [place] with 2016 pregnancy / no BP meds since birth of child   Lichen sclerosus    Newborn product of in vitro fertilization (IVF) pregnancy    Pregnancy induced hypertension    Termination of pregnancy (fetus)    x 1   Thyroid cancer ([place])    thyroid CA in 2016-sx   Allergies as of 10/08/2021       Reactions   Augmentin [amoxicillin-pot Clavulanate] Nausea And Vomiting   Erythromycin Nausea And Vomiting        Medication List        Accurate as of October 08, 2021  2:40 PM. If you have any questions, ask your nurse or doctor.          clobetasol ointment 0.05 % Commonly known as: TEMOVATE Apply 1 application topically 2 (two) times daily.   sertraline 50 MG tablet Commonly known as: ZOLOFT Take 1 tablet (50 mg total) by mouth daily. Take 1/2 tab daily for first 2 weeks. Started by: Pamela Pal, DO   sertraline 50 MG tablet Commonly known as: ZOLOFT Take 1 tablet (50 mg  total) by mouth daily. Start taking on: November 07, 2021 Started by: Pamela Pal, DO   Synthroid 125 MCG tablet Generic drug: levothyroxine Take 1 tablet (125 mcg total) by mouth daily before breakfast.        Exam BP 128/86    Pulse 60    Temp 98.1 F (36.7 C) (Oral)    Ht 5\' 8"  (1.727 m)    Wt 213 lb 6 oz (96.8 kg)    SpO2 95%    BMI 32.44 kg/m  General:  well developed, well nourished, in no apparent distress Heart: RRR Lungs:  CTAB. No respiratory distress Psych: well oriented with normal range of affect and age-appropriate judgement/insight, alert and oriented x4.  Assessment and Plan  Depression, major, single episode, moderate (HCC) - Plan: sertraline (ZOLOFT) 50 MG tablet, sertraline (ZOLOFT) 50 MG tablet  GAD (generalized anxiety disorder) - Plan: sertraline (ZOLOFT) 50 MG tablet, sertraline (ZOLOFT) 50 MG tablet  Chronic, recurrent, uncontrolled.  Restart Zoloft 25 mg daily for 2 weeks and then increase to 50 mg daily.  A list of counseling resources were provided.  Anxiety coping techniques were also provided.  Counseled on exercise being an adjunct therapy. F/u in 6 weeks to recheck.  Could consider BuSpar versus Wellbutrin versus Remeron though she would  like to avoid medications that would increase her weight. The patient voiced understanding and agreement to the plan.  [place], DO 10/08/21 2:40 PM

## 2021-11-19 ENCOUNTER — Ambulatory Visit (INDEPENDENT_AMBULATORY_CARE_PROVIDER_SITE_OTHER): Payer: BC Managed Care – PPO | Admitting: Family Medicine

## 2021-11-19 ENCOUNTER — Encounter: Payer: Self-pay | Admitting: Family Medicine

## 2021-11-19 VITALS — BP 152/98 | HR 57 | Temp 97.9°F | Ht 68.0 in | Wt 212.0 lb

## 2021-11-19 DIAGNOSIS — R030 Elevated blood-pressure reading, without diagnosis of hypertension: Secondary | ICD-10-CM | POA: Diagnosis not present

## 2021-11-19 DIAGNOSIS — E1165 Type 2 diabetes mellitus with hyperglycemia: Secondary | ICD-10-CM | POA: Diagnosis not present

## 2021-11-19 DIAGNOSIS — F411 Generalized anxiety disorder: Secondary | ICD-10-CM

## 2021-11-19 DIAGNOSIS — Z23 Encounter for immunization: Secondary | ICD-10-CM | POA: Diagnosis not present

## 2021-11-19 DIAGNOSIS — F321 Major depressive disorder, single episode, moderate: Secondary | ICD-10-CM

## 2021-11-19 DIAGNOSIS — E892 Postprocedural hypoparathyroidism: Secondary | ICD-10-CM

## 2021-11-19 DIAGNOSIS — C73 Malignant neoplasm of thyroid gland: Secondary | ICD-10-CM

## 2021-11-19 LAB — MICROALBUMIN / CREATININE URINE RATIO
Creatinine,U: 15.9 mg/dL
Microalb Creat Ratio: 4.4 mg/g (ref 0.0–30.0)
Microalb, Ur: <0.7 mg/dL (ref 0.0–1.9)

## 2021-11-19 MED ORDER — SERTRALINE HCL 50 MG PO TABS: 50.0000 mg | ORAL_TABLET | Freq: Every day | ORAL | 3 refills | Status: DC

## 2021-11-19 MED ORDER — DEXCOM G5 RECEIVER KIT DEVI: 1 refills | Status: DC

## 2021-11-19 MED ORDER — DEXCOM G6 TRANSMITTER MISC: 1 refills | Status: DC

## 2021-11-19 MED ORDER — ONDANSETRON 4 MG PO TBDP: 4.0000 mg | ORAL_TABLET | Freq: Three times a day (TID) | ORAL | 0 refills | Status: DC | PRN

## 2021-11-19 MED ORDER — DEXCOM G6 SENSOR MISC: 1 refills | Status: DC

## 2021-11-19 MED ORDER — ROSUVASTATIN CALCIUM 20 MG PO TABS: 20.0000 mg | ORAL_TABLET | Freq: Every day | ORAL | 3 refills | Status: DC

## 2021-11-19 NOTE — Progress Notes (Signed)
Chief Complaint  Patient presents with   Follow-up    Zoloft Discuss results a1c     Subjective Pamela [name] presents for f/u anxiety/depression.  Pt is currently being treated with Zoloft 50 mg/d.  Reports doing well since treatment. No thoughts of harming self or others. No self-medication with alcohol, prescription drugs or illicit drugs. Pt is not following with a counselor/psychologist.  Recently had blood work showing A1c of 6.5. last eye exam was Oct, 2022.  Diet is fair, she walks a little bit.  She is not on a statin.  She has never had a pneumonia vaccine.  Past Medical History:  Diagnosis Date   Allergy    seasonal allergies   Arthritis    osteoarthritis in bilateral hands   Complication of anesthesia    trouble waking up after second myomectomy   Depression    hx of situational depression   Dermatitis    Diabetes mellitus without complication ([place])    Dysrhythmia    resolved- "grew out of it" - no problem since teenager   Genital warts    removed   GERD (gastroesophageal reflux disease)    hx-bacterial ulcer in 2004   Gestational diabetes    Hypertension 2016   [place] with 2016 pregnancy / no BP meds since birth of child   Lichen sclerosus    Newborn product of in vitro fertilization (IVF) pregnancy    Pregnancy induced hypertension    Termination of pregnancy (fetus)    x 1   Thyroid cancer ([place])    thyroid CA in 2016-sx   Allergies as of 11/19/2021       Reactions   Augmentin [amoxicillin-pot Clavulanate] Nausea And Vomiting   Erythromycin Nausea And Vomiting        Medication List        Accurate as of November 19, 2021 12:39 PM. If you have any questions, ask your nurse or doctor.          clobetasol ointment 0.05 % Commonly known as: TEMOVATE Apply 1 application topically 2 (two) times daily.   Dexcom G5 Receiver Kit Devi Use to check blood sugar What changed: additional instructions Changed by: [name], DO    Dexcom G6 Sensor Misc Use to check blood sugar What changed: additional instructions Changed by: [name], DO   Dexcom G6 Transmitter Misc Use to check blood sugar. What changed: additional instructions Changed by: [name], DO   ondansetron 4 MG disintegrating tablet Commonly known as: ZOFRAN-ODT Take 1 tablet (4 mg total) by mouth every 8 (eight) hours as needed for nausea or vomiting. Started by: [name], DO   rosuvastatin 20 MG tablet Commonly known as: Crestor Take 1 tablet (20 mg total) by mouth daily. Started by: [name], DO   sertraline 50 MG tablet Commonly known as: ZOLOFT Take 1 tablet (50 mg total) by mouth daily. What changed: Another medication with the same name was removed. Continue taking this medication, and follow the directions you see here. Changed by: [name], DO   Synthroid 125 MCG tablet Generic drug: levothyroxine Take 1 tablet (125 mcg total) by mouth daily before breakfast.        Exam BP (!) 152/98 (BP Location: Left Arm, Cuff Size: Normal)    Pulse (!) 57    Temp 97.9 F (36.6 C) (Oral)    Ht _0  (1.727 m)    Wt 212 lb (96.2 kg)  SpO2 97%    BMI 32.23 kg/m  General:  well developed, well nourished, in no apparent distress Heart: RRR Lungs:  CTAB. No respiratory distress Neuro: Gait is normal, sensation intact to pinprick over bilateral feet Skin: No external lesions on the feet noted Psych: well oriented with normal range of affect and age-appropriate judgement/insight, alert and oriented x4.  Assessment and Plan  Type 2 diabetes mellitus with hyperglycemia, without long-term current use of insulin ([place]) - Plan: Microalbumin / creatinine urine ratio, rosuvastatin (CRESTOR) 20 MG tablet  Depression, major, single episode, moderate (HCC) - Plan: sertraline (ZOLOFT) 50 MG tablet  GAD (generalized anxiety disorder) - Plan: sertraline (ZOLOFT) 50 MG  tablet  Elevated blood pressure reading  Need for vaccination against Streptococcus pneumoniae - Plan: Pneumococcal conjugate vaccine 20-valent (Prevnar 20)  Post-surgical hypoparathyroidism (HCC), Chronic  Papillary thyroid carcinoma (HCC), Chronic  Chronic issue, start Crestor 20 mg daily, foot exam normal today, PCV 20 today, urine microalbumin creatinine ratio today.  Follow-up in 3 months. Depression/anxiety are controlled on Zoloft 50 mg daily.  No changes. As above She will monitor her blood pressure at home and let me know if it is 140/90 or higher. The patient voiced understanding and agreement to the plan.  [place], DO 11/19/21 12:39 PM

## 2021-11-19 NOTE — Patient Instructions (Signed)
Keep the diet clean and stay active.  Give us 2-3 business days to get the results of your labs back.   Let us know if you need anything.  

## 2021-11-22 ENCOUNTER — Encounter: Payer: Self-pay | Admitting: Family Medicine

## 2021-11-22 ENCOUNTER — Telehealth: Payer: Self-pay | Admitting: *Deleted

## 2021-11-22 ENCOUNTER — Encounter: Payer: Self-pay | Admitting: Internal Medicine

## 2021-11-22 DIAGNOSIS — E890 Postprocedural hypothyroidism: Secondary | ICD-10-CM

## 2021-11-22 DIAGNOSIS — C73 Malignant neoplasm of thyroid gland: Secondary | ICD-10-CM

## 2021-11-22 MED ORDER — SYNTHROID 125 MCG PO TABS: 125.0000 ug | ORAL_TABLET | Freq: Every day | ORAL | 0 refills | Status: DC

## 2021-11-22 NOTE — Telephone Encounter (Signed)
Prior auth started via cover my meds.  Awaiting determination.  Key: [id number]

## 2021-11-23 NOTE — Telephone Encounter (Signed)
Prior auth denied.  You should be getting the letter by fax if you have not already for the reason of why they denied.

## 2021-11-26 ENCOUNTER — Encounter: Payer: Self-pay | Admitting: Family Medicine

## 2021-11-30 ENCOUNTER — Other Ambulatory Visit: Payer: Self-pay | Admitting: Family Medicine

## 2021-11-30 ENCOUNTER — Encounter: Payer: Self-pay | Admitting: Family Medicine

## 2021-11-30 MED ORDER — FREESTYLE LIBRE 3 SENSOR MISC: 1.0000 | 3 refills | Status: DC

## 2021-12-03 ENCOUNTER — Encounter: Payer: Self-pay | Admitting: Family Medicine

## 2021-12-03 ENCOUNTER — Ambulatory Visit (INDEPENDENT_AMBULATORY_CARE_PROVIDER_SITE_OTHER): Payer: BC Managed Care – PPO | Admitting: Family Medicine

## 2021-12-03 VITALS — BP 122/80 | HR 56 | Temp 98.1°F | Ht 68.0 in | Wt 208.4 lb

## 2021-12-03 DIAGNOSIS — R030 Elevated blood-pressure reading, without diagnosis of hypertension: Secondary | ICD-10-CM | POA: Diagnosis not present

## 2021-12-03 NOTE — Progress Notes (Signed)
Chief Complaint  Patient presents with   Hypertension    Subjective Pamela [name] is a 51 y.o. female who presents for hypertension follow up. She does monitor home blood pressures. Blood pressures ranging from 140-150's/90's on average. She is compliant with medications. Patient has these side effects of medication: none She is adhering to a healthy diet overall. Current exercise: chasing her kids No CP or SOB.    Past Medical History:  Diagnosis Date   Allergy    seasonal allergies   Arthritis    osteoarthritis in bilateral hands   Complication of anesthesia    trouble waking up after second myomectomy   Depression    hx of situational depression   Dermatitis    Diabetes mellitus without complication ([place])    Dysrhythmia    resolved- "grew out of it" - no problem since teenager   Genital warts    removed   GERD (gastroesophageal reflux disease)    hx-bacterial ulcer in 2004   Gestational diabetes    Hypertension 2016   [place] with 2016 pregnancy / no BP meds since birth of child   Lichen sclerosus    Newborn product of in vitro fertilization (IVF) pregnancy    Pregnancy induced hypertension    Termination of pregnancy (fetus)    x 1   Thyroid cancer ([place])    thyroid CA in 2016-sx    Exam BP 122/80    Pulse (!) 56    Temp 98.1 F (36.7 C) (Oral)    Ht 5\' 8"  (1.727 m)    Wt 208 lb 6 oz (94.5 kg)    SpO2 98%    BMI 31.68 kg/m  General:  well developed, well nourished, in no apparent distress Heart: RRR, no bruits, no LE edema Lungs: clear to auscultation, no accessory muscle use Psych: well oriented with normal range of affect and appropriate judgment/insight  Elevated blood pressure reading  Counseled on diet and exercise. Rechecked BP, 142/82. Discussed ADA BP recs. Monitor BP at home. Agreed to monitor at home as she has a new monitor, will send me a message in 2 weeks.  F/u as originally scheduled. If elevated, will rx olmesartan 20 mg/d and ck BMP in 1  week after starting.  The patient voiced understanding and agreement to the plan.  [place], DO 12/03/21  9:47 AM

## 2021-12-03 NOTE — Patient Instructions (Signed)
Send me a message in 2 weeks with your blood pressure readings.   Check your blood pressures 3-4 times per week, alternating the time of day you check it. If it is high, considering waiting 1-2 minutes and rechecking. If it gets higher, your anxiety is likely creeping up and we should avoid rechecking.   When you check your BP, make sure you have been doing something calm/relaxing 5 minutes prior to checking. Both feet should be flat on the floor and you should be sitting. Use your left arm and make sure it is in a relaxed position (on a table), and that the cuff is at the approximate level/height of your heart.  Keep the diet clean and stay active.  Aim to do some physical exertion for 150 minutes per week. This is typically divided into 5 days per week, 30 minutes per day. The activity should be enough to get your heart rate up. Anything is better than nothing if you have time constraints.  Let us know if you need anything.

## 2021-12-22 ENCOUNTER — Encounter: Payer: Self-pay | Admitting: Family Medicine

## 2021-12-27 ENCOUNTER — Encounter: Payer: Self-pay | Admitting: Internal Medicine

## 2021-12-28 ENCOUNTER — Other Ambulatory Visit: Payer: Self-pay

## 2021-12-28 DIAGNOSIS — E890 Postprocedural hypothyroidism: Secondary | ICD-10-CM

## 2021-12-28 DIAGNOSIS — E892 Postprocedural hypoparathyroidism: Secondary | ICD-10-CM

## 2021-12-30 DIAGNOSIS — E892 Postprocedural hypoparathyroidism: Secondary | ICD-10-CM | POA: Diagnosis not present

## 2021-12-30 DIAGNOSIS — E890 Postprocedural hypothyroidism: Secondary | ICD-10-CM | POA: Diagnosis not present

## 2021-12-31 LAB — CALCIUM, IONIZED: Calcium, Ion: 4.4 mg/dL — ABNORMAL LOW (ref 4.5–5.6)

## 2021-12-31 LAB — T4, FREE: Free T4: 1.84 ng/dL — ABNORMAL HIGH (ref 0.82–1.77)

## 2021-12-31 LAB — TSH: TSH: 0.281 u[IU]/mL — ABNORMAL LOW (ref 0.450–4.500)

## 2022-01-02 ENCOUNTER — Other Ambulatory Visit: Payer: Self-pay | Admitting: Internal Medicine

## 2022-01-02 DIAGNOSIS — E890 Postprocedural hypothyroidism: Secondary | ICD-10-CM

## 2022-02-08 ENCOUNTER — Encounter: Payer: Self-pay | Admitting: Family Medicine

## 2022-02-16 ENCOUNTER — Ambulatory Visit (INDEPENDENT_AMBULATORY_CARE_PROVIDER_SITE_OTHER): Payer: BC Managed Care – PPO | Admitting: Family Medicine

## 2022-02-16 ENCOUNTER — Encounter: Payer: Self-pay | Admitting: Family Medicine

## 2022-02-16 ENCOUNTER — Telehealth: Payer: Self-pay | Admitting: Family Medicine

## 2022-02-16 VITALS — BP 122/80 | HR 56 | Temp 97.9°F | Ht 68.0 in | Wt 189.1 lb

## 2022-02-16 DIAGNOSIS — E1165 Type 2 diabetes mellitus with hyperglycemia: Secondary | ICD-10-CM | POA: Diagnosis not present

## 2022-02-16 DIAGNOSIS — Z1159 Encounter for screening for other viral diseases: Secondary | ICD-10-CM | POA: Diagnosis not present

## 2022-02-16 DIAGNOSIS — Z23 Encounter for immunization: Secondary | ICD-10-CM | POA: Diagnosis not present

## 2022-02-16 LAB — COMPREHENSIVE METABOLIC PANEL
ALT: 13 U/L (ref 0–35)
AST: 13 U/L (ref 0–37)
Albumin: 4.8 g/dL (ref 3.5–5.2)
Alkaline Phosphatase: 64 U/L (ref 39–117)
BUN: 16 mg/dL (ref 6–23)
CO2: 29 mEq/L (ref 19–32)
Calcium: 9 mg/dL (ref 8.4–10.5)
Chloride: 101 mEq/L (ref 96–112)
Creatinine, Ser: 0.86 mg/dL (ref 0.40–1.20)
GFR: 77.23 mL/min (ref >60.00)
Glucose, Bld: 87 mg/dL (ref 70–99)
Potassium: 4.5 mEq/L (ref 3.5–5.1)
Sodium: 138 mEq/L (ref 135–145)
Total Bilirubin: 1.2 mg/dL (ref 0.2–1.2)
Total Protein: 7.2 g/dL (ref 6.0–8.3)

## 2022-02-16 LAB — LIPID PANEL
Cholesterol: 119 mg/dL (ref 0–200)
HDL: 52.5 mg/dL (ref >39.00)
LDL Cholesterol: 52 mg/dL (ref 0–99)
NonHDL: 66.71
Total CHOL/HDL Ratio: 2
Triglycerides: 73 mg/dL (ref 0.0–149.0)
VLDL: 14.6 mg/dL (ref 0.0–40.0)

## 2022-02-16 LAB — HEMOGLOBIN A1C: Hgb A1c MFr Bld: 5.6 % (ref 4.6–6.5)

## 2022-02-16 MED ORDER — OZEMPIC (1 MG/DOSE) 4 MG/3ML ~~LOC~~ SOPN: 1.0000 mg | PEN_INJECTOR | SUBCUTANEOUS | 5 refills | Status: DC

## 2022-02-16 NOTE — Patient Instructions (Signed)
Give us 2-3 business days to get the results of your labs back.   Keep the diet clean and stay active.  Let us know if you need anything. 

## 2022-02-16 NOTE — Telephone Encounter (Signed)
Initiated PA for Ozempic ?KEY:  [id number] ?Received approval. ?

## 2022-02-16 NOTE — Progress Notes (Signed)
Subjective:  ? ?Chief Complaint  ?Patient presents with  ? Follow-up  ?  Diabetes ?  ? ? ?Pamela [name] is a 51 y.o. female here for follow-up of diabetes.   ?[name]'s self monitored glucose range is low 100's.  ?Patient denies hypoglycemic reactions. ?She has a CGM.  ?Patient does not require insulin.   ?Medications include: Ozempic 1 mg/week ?Diet is healthy.  ?Exercise: walking ?No CP or SOB.  ? ?Past Medical History:  ?Diagnosis Date  ? Allergy   ? seasonal allergies  ? Arthritis   ? osteoarthritis in bilateral hands  ? Complication of anesthesia   ? trouble waking up after second myomectomy  ? Depression   ? hx of situational depression  ? Dermatitis   ? Diabetes mellitus without complication ([place])   ? Dysrhythmia   ? resolved- "grew out of it" - no problem since teenager  ? Genital warts   ? removed  ? GERD (gastroesophageal reflux disease)   ? hx-bacterial ulcer in 2004  ? Gestational diabetes   ? Hypertension 2016  ? [place] with 2016 pregnancy / no BP meds since birth of child  ? Lichen sclerosus   ? Newborn product of in vitro fertilization (IVF) pregnancy   ? Pregnancy induced hypertension   ? Termination of pregnancy (fetus)   ? x 1  ? Thyroid cancer ([place])   ? thyroid CA in 2016-sx  ?  ? ?Related testing: ?Retinal exam: Done ?Pneumovax: done ? ?Objective:  ?BP 122/80   Pulse (!) 56   Temp 97.9 ?F (36.6 ?C) (Oral)   Ht '5\' 8"'$  (1.727 m)   Wt 189 lb 2 oz (85.8 kg)   SpO2 99%   BMI 28.76 kg/m?  ?General:  Well developed, well nourished, in no apparent distress ?Lungs:  CTAB, no access msc use ?Cardio:  RRR, no bruits, no LE edema ?Psych: Age appropriate judgment and insight ? ?Assessment:  ? ?Type 2 diabetes mellitus with hyperglycemia, without long-term current use of insulin (HCC) - Plan: Semaglutide, 1 MG/DOSE, (OZEMPIC, 1 MG/DOSE,) 4 MG/3ML SOPN, Hemoglobin A1c, Lipid panel, Comprehensive metabolic panel ? ?Encounter for hepatitis C screening test for low risk patient - Plan: Hepatitis C  antibody ? ?Need for shingles vaccine - Plan: Varicella-zoster vaccine IM (Shingrix)  ? ?Plan:  ? ?Chronic, stable. Cont Ozempic 1 mg/week. Counseled on diet and exercise. ?First Shingrix today, will get the second 1 at her follow-up. ?F/u in 6 mo. ?The patient voiced understanding and agreement to the plan. ? ?[name], DO ?02/16/22 ?10:26 AM ? ?

## 2022-02-17 ENCOUNTER — Other Ambulatory Visit: Payer: Self-pay | Admitting: Family Medicine

## 2022-02-17 ENCOUNTER — Telehealth: Payer: Self-pay | Admitting: *Deleted

## 2022-02-17 ENCOUNTER — Encounter: Payer: Self-pay | Admitting: Family Medicine

## 2022-02-17 LAB — HEPATITIS C ANTIBODY
Hepatitis C Ab: NONREACTIVE
SIGNAL TO CUT-OFF: 0.12 (ref <1.00)

## 2022-02-17 MED ORDER — MOUNJARO 7.5 MG/0.5ML ~~LOC~~ SOAJ: 7.5000 mg | SUBCUTANEOUS | 5 refills | Status: DC

## 2022-02-17 NOTE — Telephone Encounter (Signed)
Effective from 02/17/2022 through 02/16/2023. ?

## 2022-02-17 NOTE — Telephone Encounter (Signed)
Prior auth started via cover my meds.  Awaiting determination. ? ?Key: [id number] ?

## 2022-03-15 ENCOUNTER — Encounter: Payer: Self-pay | Admitting: Internal Medicine

## 2022-03-15 ENCOUNTER — Ambulatory Visit (INDEPENDENT_AMBULATORY_CARE_PROVIDER_SITE_OTHER): Payer: BC Managed Care – PPO | Admitting: Internal Medicine

## 2022-03-15 VITALS — BP 128/80 | HR 60 | Ht 68.0 in | Wt 179.0 lb

## 2022-03-15 DIAGNOSIS — E559 Vitamin D deficiency, unspecified: Secondary | ICD-10-CM

## 2022-03-15 DIAGNOSIS — E892 Postprocedural hypoparathyroidism: Secondary | ICD-10-CM | POA: Diagnosis not present

## 2022-03-15 DIAGNOSIS — E890 Postprocedural hypothyroidism: Secondary | ICD-10-CM | POA: Diagnosis not present

## 2022-03-15 DIAGNOSIS — R7303 Prediabetes: Secondary | ICD-10-CM

## 2022-03-15 DIAGNOSIS — C73 Malignant neoplasm of thyroid gland: Secondary | ICD-10-CM

## 2022-03-15 LAB — T4, FREE: Free T4: 1.62 ng/dL — ABNORMAL HIGH (ref 0.60–1.60)

## 2022-03-15 LAB — TSH: TSH: 0.07 u[IU]/mL — ABNORMAL LOW (ref 0.35–5.50)

## 2022-03-15 MED ORDER — SYNTHROID 112 MCG PO TABS: 112.0000 ug | ORAL_TABLET | Freq: Every day | ORAL | 3 refills | Status: DC

## 2022-03-15 NOTE — Patient Instructions (Signed)
Please continue: - vitamin D 5000 units daily - calcium 500 mg daily with dinner   Please continue levothyroxine 125 mcg daily.  Take the thyroid hormone every day, with water, at least 30 minutes before breakfast, separated by at least 4 hours from: - acid reflux medications - calcium - iron - multivitamins  Please stop at the lab.  Please come back for a follow-up appointment in 1 year.

## 2022-03-15 NOTE — Progress Notes (Signed)
Patient ID: Pamela [name], female   DOB: [birth year], 51 y.o.   MRN: [id number]  HPI  Pamela [name] is a 51 y.o.-year-old female, initially referred by Dr. Dellis Filbert, presenting for follow-up for h/o papillary thyroid cancer,postsurgical hypothyroidism and also postsurgical hypoparathyroidism. Last visit 1 year ago.  Interim history: In the last 4-5 months, she lost 30 pounds - started to lose weight on compounded Ozempic, now on Mounjaro.  She feels great.  Her cravings disappeared. At last visit, she occasional tingling in her body, resolved by taking calcium tablets.  No recent tingling or cramping after she started to take calcium every day with dinner.  Reviewed her PTC cancer history: She had an exam with ObGyn on 12/15/2014 >> Dr. Dellis Filbert felt a lump in neck.  Thyroid U/S (02/23/2015): Hypoechoic, solid right-sided isthmus nodule measures approximately 1.4 x 0.6 x 1.0 cm. There are a few scattered areas of peripheral calcification in this nodule.    FNA (02/25/2015): FINDINGS CONSISTENT WITH PAPILLARY CARCINOMA ([place] CATEGORY VI).  Thyroidectomy (03/12/2015): 1. Thyroid, thyroidectomy, total thyroid sutures right lobe - MULTIFOCAL PAPILLARY THYROID CARCINOMA, TWO FOCI MEASURING 1.1 CM AND 0.6 CM IN GREATEST DIMENSION. - MARGINS ARE NEGATIVE. - ONE BENIGN LYMPH NODE WITH NO TUMOR SEEN (0/1). - SEE ONCOLOGY TEMPLATE. ADDITIONAL FINDINGS: - INCIDENTAL BENIGN PARATHYROID TISSUE (0.3 CM). 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 2. Lymph nodes, regional resection, central compartment - THREE BENIGN LYMPH NODES WITH NO TUMOR SEEN (0/3). - INCIDENTAL BENIGN THYMIC TISSUE.  1. THYROID Specimen: Total thyroid with central compartment lymph nodes. Procedure: Total thyroidectomy with central compartment lymph node dissection. Specimen Integrity (intact/fragmented): Intact. Tumor focality: Multifocal, two foci.  Dominant tumor: Maximum tumor size (cm): 1.1 cm Tumor laterality:  Isthmus (central). Histologic type (including subtype and/or unique features as applicable): Papillary thyroid carcinoma, conventional type. Tumor capsule: No tumor capsule identified. Extrathyroidal extension: No. Margins: Negative. Lymph - Vascular invasion: Not identified. Capsular invasion with degree of invasion if present: Not applicable.  Second tumor: Tumor size(s): 0.6 cm Tumor laterality: Right superior. Histologic type (including subtype and/or unique features as applicable): Papillary thyroid carcinoma, conventional type. Tumor capsule: No tumor capsule identified. Extrathyroidal extension: No. Margins: Negative. Lymph - Vascular invasion: Not identified. Capsular invasion with degree of invasion if present: Not applicable. Lymph nodes: # examined 4; # positive; 0.  She did not have RAI treatment due to small sizes of the tumors.  04/11/2016: Neck U/S:  No evidence of tissue in the right or left thyroid beds to suggest recurrence of residual thyroid tissue.  No evidence of abnormal adenopathy.  04/06/2020: Neck U/S:  No abnormal masses in the neck, no signs of cancer recurrence.  Her thyroglobulin's are low and her globulin antibodies are not elevated: Lab Results  Component Value Date   THYROGLB 0.3 (L) 03/11/2021   THYROGLB 0.2 (L) 03/10/2020   THYROGLB 0.2 (L) 02/14/2018   THYROGLB 0.2 (L) 02/14/2017   THYROGLB 0.1 (L) 03/17/2016   THYROGLB 0.2 (L) 09/18/2015   Lab Results  Component Value Date   THGAB <1 03/11/2021   THGAB <1 03/10/2020   THGAB <1 02/14/2018   THGAB <1 02/14/2017   THGAB <1 03/17/2016   THGAB <1 09/18/2015   Pt denies: - feeling nodules in neck - hoarseness - dysphagia - choking  Reviewed her TFTs: Lab Results  Component Value Date   TSH 0.281 (L) 12/30/2021   TSH 7.04 (H) 03/11/2021   TSH 2.30 03/10/2020   TSH 1.750 03/29/2018  TSH 0.29 (L) 02/14/2018   TSH 0.74 01/04/2018   TSH 0.736 06/23/2017   FREET4 1.84 (H)  12/30/2021   FREET4 1.09 03/11/2021   FREET4 0.95 03/10/2020   FREET4 1.35 03/29/2018   FREET4 1.12 02/14/2018   FREET4 1.00 01/04/2018   FREET4 1.42 06/23/2017  10/28/2021: TSH 1.01 Received labs from Dr. Ronita Hipps, from 08/30/2017: - TSH 0.58, free T4 1.42, both at goal  Pt is on levothyroxine 125 mcg daily, taken: - in am - fasting - at least 30 min from b'fast - + Ca at night, no Fe, + MVI at night, no PPIs - not on Biotin  She  also has a history of HTN during previous pregnancy >> son born 10/2014.  She had IVF in 2018 >> healthy baby girl born 11/2017.   At last visit, she complained about tingling in her feet.  Vitamin B12 level was normal: Lab Results  Component Value Date   VITAMINB12 401 02/14/2018   We started: -Calcium 500 mg with dinner >> then stopped, only take it as needed, 1-2 times a month >> now daily -Vitamin D 5000 units daily >> then 2500 units daily >> 5000 units daily  Reviewed pertinent labs: Lab Results  Component Value Date   PTH 14 (L) 03/10/2020   PTH Comment 03/10/2020   PTH 13 (L) 02/14/2018   PTH Comment 02/14/2018   CALCIUM 9.0 02/16/2022   CALCIUM 7.5 (L) 03/11/2021   CALCIUM 7.7 (L) 03/18/2020   CALCIUM 8.0 (L) 03/10/2020   CALCIUM 8.9 03/29/2018   CALCIUM 7.9 (L) 02/14/2018   CALCIUM 7.8 (L) 11/24/2017   CALCIUM 8.7 04/17/2015   CALCIUM 8.3 (L) 03/31/2015   CALCIUM 8.4 (L) 03/29/2015   Lab Results  Component Value Date   VD25OH 31.8 03/11/2021   VD25OH 44.63 03/10/2020   VD25OH 46.4 03/29/2018   VD25OH 19.27 (L) 02/14/2018   She mentions that she has a history of gestational diabetes with one of her pregnancies.  Most recent HbA1c was: Lab Results  Component Value Date   HGBA1C 5.6 02/16/2022   HGBA1C 6.4 03/11/2021   HGBA1C 6.4 03/18/2020  11/02/2021: HbA1c 6.5%  She has a CGM.  She is on Mounjaro per PCP.  ROS: + see HPI  I reviewed pt's medications, allergies, PMH, social hx, family hx, and changes were  documented in the history of present illness. Otherwise, unchanged from my initial visit note.  Past Medical History:  Diagnosis Date   Allergy    seasonal allergies   Arthritis    osteoarthritis in bilateral hands   Complication of anesthesia    trouble waking up after second myomectomy   Depression    hx of situational depression   Dermatitis    Diabetes mellitus without complication ([place])    Dysrhythmia    resolved- "grew out of it" - no problem since teenager   Genital warts    removed   GERD (gastroesophageal reflux disease)    hx-bacterial ulcer in 2004   Gestational diabetes    Hypertension 2016   [place] with 2016 pregnancy / no BP meds since birth of child   Lichen sclerosus    Newborn product of in vitro fertilization (IVF) pregnancy    Pregnancy induced hypertension    Termination of pregnancy (fetus)    x 1   Thyroid cancer ([place])    thyroid CA in 2016-sx   Past Surgical History:  Procedure Laterality Date   BLADDER SURGERY  1992   Widened the  urethra   CESAREAN SECTION N/A 11/04/2014   Procedure: CESAREAN SECTION;  Surgeon: [name], MD;  Location: [facility];  Service: Obstetrics;  Laterality: N/A;  EDD: 11/23/14   CESAREAN SECTION N/A 11/24/2017   Procedure: Repeat CESAREAN SECTION;  Surgeon: Brien Few, MD;  Location: [facility];  Service: Obstetrics;  Laterality: N/A;  EDD: 12/14/17 Allergy: Erythromycin, Augmentin   LYMPH NODE DISSECTION  03/12/2015   Procedure: LYMPH NODE DISSECTION;  Surgeon: Armandina Gemma, MD;  Location: [facility];  Service: General;;   myomectomy  2008   MYOMECTOMY  2014   Removed tubes bilaterally   MYOMECTOMY N/A 11/04/2014   Procedure: MYOMECTOMY;  Surgeon: [name], MD;  Location: [facility];  Service: Obstetrics;  Laterality: N/A;   THYROIDECTOMY N/A 03/12/2015   Procedure: TOTAL THYROIDECTOMY;  Surgeon: Armandina Gemma, MD;  Location: [facility];  Service: General;  Laterality: N/A;   TUBAL LIGATION     WISDOM TOOTH EXTRACTION      History   Social History   Marital Status: Married    Spouse Name: N/A   Number of Children: 1   Occupational History    accountant    Social History Main Topics   Smoking status: Former Smoker -- 1.00 packs/day for 20 years    Types: Cigarettes    Quit date: 08/11/2007   Smokeless tobacco: Never Used   Alcohol Use: Yes     Comment: Beer/liquor 1 drink once a week    Drug Use: Yes    Special: Marijuana, Cocaine     Comment: 25 years ago "Acid"  , last use cocaine/marijuana 25 yrs ago   Current Outpatient Medications on File Prior to Visit  Medication Sig Dispense Refill   clobetasol ointment (TEMOVATE) 2.11 % Apply 1 application topically 2 (two) times daily.     Continuous Blood Gluc Sensor (FREESTYLE LIBRE 3 SENSOR) MISC Apply 1 each topically every 14 (fourteen) days. Use to check blood sugar. 6 each 3   rosuvastatin (CRESTOR) 20 MG tablet Take 1 tablet (20 mg total) by mouth daily. 90 tablet 3   sertraline (ZOLOFT) 50 MG tablet Take 1 tablet (50 mg total) by mouth daily. 90 tablet 3   SYNTHROID 125 MCG tablet TAKE 1 TABLET(125 MCG) BY MOUTH DAILY BEFORE AND BREAKFAST 45 tablet 1   tirzepatide (MOUNJARO) 7.5 MG/0.5ML Pen Inject 7.5 mg into the skin once a week. 2 mL 5   No current facility-administered medications on file prior to visit.   Allergies  Allergen Reactions   Augmentin [Amoxicillin-Pot Clavulanate] Nausea And Vomiting   Erythromycin Nausea And Vomiting   No family history available. Patient is adopted.  She has lichen sclerosus >> on Clobetasol.  PE: BP 128/80 (BP Location: Right Arm, Patient Position: Sitting, Cuff Size: Normal)   Pulse 60   Ht _0  (1.727 m)   Wt 179 lb (81.2 kg)   SpO2 98%   BMI 27.22 kg/m    Wt Readings from Last 3 Encounters:  03/15/22 179 lb (81.2 kg)  02/16/22 189 lb 2 oz (85.8 kg)  12/03/21 208 lb 6 oz (94.5 kg)   Constitutional: overweight, in NAD Eyes: EOMI, no exophthalmos ENT: moist mucous membranes, no neck  masses palpated, no cervical lymphadenopathy Cardiovascular: RRR, No MRG Respiratory: CTA B Musculoskeletal: no deformities Skin: moist, warm, no rashes Neurological: no tremor with outstretched hands  ASSESSMENT: 1. Bifocal papillary thyroid cancer  2. Postsurgical hypothyroidism  3.  Postsurgical hypoparathyroidism  4.  Vitamin D deficiency  PLAN: 1. Papillary thyroid cancer -Reviewed her papillary thyroid cancer features: Not encapsulated Multifocal, but the second focus was a micro PTC No extension in the lymph or blood vessels  Margins free of disease Latest neck ultrasound from 2017 showed no recurrence or metastasis in the neck Overall, she is stage I PTC, which has a very good prognosis >> no RAI treatment was necessary -Neck ultrasound from 04/06/2020 showed: No suspicious masses in the neck, no evidence of recurrence -Thyroglobulin levels remain low, but detectable, as expected without RAI treatment.  Her antithyroglobulin antibodies are undetectable. -At last visit, thyroglobulin was slightly higher, possibly due to the elevated TSH (7) -We will repeat her thyroglobulin and ATA antibodies now -I will see her back in 1 year  2. Postsurgical hypothyroidism - latest thyroid labs reviewed with pt. >> TSH was low: Lab Results  Component Value Date   TSH 0.281 (L) 12/30/2021  - she continues on LT4 125 mcg daily (increased at last visit) - pt feels good on this dose.  She lost 23 pounds in the last 4 months!  We discussed about signs of significant weight loss possibly requiring a decrease in her levothyroxine dose. - we discussed about taking the thyroid hormone every day, with water, >30 minutes before breakfast, separated by >4 hours from acid reflux medications, calcium, iron, multivitamins. Pt. is taking it correctly. - will check thyroid tests today: TSH and fT4 - If labs are abnormal, she will need to return for repeat TFTs in 1.5 months  3.  Postsurgical  hypoparathyroidism and 4. vitamin D deficiency -PTH was low, at 14, after surgery -At last visit we restarted calcium 500 mg daily with dinner and continued 5000 units vitamin D daily -Latest calcium level from 02/16/2022 was normal at 9.0.  She previously had an ionized calcium that was slightly low at 4.4 on 12/30/2021. -Latest vitamin D level reviewed from 10/28/2021 and this was normal, at 31.4 -At today's visit, she denies numbness/tingling/ache or cramping when taking calcium consistently.  However, at last visit, she missed her calcium for 2 to 3 days and she started to have the symptoms.  We discussed about the importance of taking it every day.    4. Prediabetes -At last visit HbA1c was stable, at 6.4%, still in the prediabetic range -At that time, we discussed about the first-line for treatment being changes in diet and also increased exercise.  Recommended weight loss. -She has a history of gestational diabetes with one of her pregnancies -An HbA1c was actually higher, at 6.5%, in the diabetic range in 10/2021 at an integrative medicine facility in [place].  At that time, she was recommended Ozempic, but this was not covered by her insurance, so she started compounded Ozempic.  However, afterwards, she was able to transition to [facility] per PCP. -She had a more recent HbA1c which was excellent, at 5.6% last month  Component     Latest Ref Rng 03/15/2022  T4,Free(Direct)     0.60 - 1.60 ng/dL 1.62 (H)   TSH     0.35 - 5.50 uIU/mL 0.07 (L)    TSH is too suppressed >> will decrease LT4 to 112 mcg and recheck the TFTs in 1.5 mo.   Component     Latest Ref Rng 03/15/2022  Thyroglobulin Ab     < or = 1 IU/mL <1   Thyroglobulin     ng/mL 0.1 (L)   Comment --   Tg improved, barely detectable, while ATA antibodies are undetectable.  Philemon Kingdom, MD PhD [facility]

## 2022-03-16 LAB — THYROGLOBULIN LEVEL: Thyroglobulin: 0.1 ng/mL — ABNORMAL LOW

## 2022-03-16 LAB — THYROGLOBULIN ANTIBODY: Thyroglobulin Ab: <1 IU/mL (ref <=1)

## 2022-03-17 DIAGNOSIS — E119 Type 2 diabetes mellitus without complications: Secondary | ICD-10-CM | POA: Diagnosis not present

## 2022-03-17 DIAGNOSIS — H6692 Otitis media, unspecified, left ear: Secondary | ICD-10-CM | POA: Diagnosis not present

## 2022-03-17 DIAGNOSIS — J020 Streptococcal pharyngitis: Secondary | ICD-10-CM | POA: Diagnosis not present

## 2022-03-21 ENCOUNTER — Ambulatory Visit (INDEPENDENT_AMBULATORY_CARE_PROVIDER_SITE_OTHER): Payer: BC Managed Care – PPO | Admitting: Family Medicine

## 2022-03-21 ENCOUNTER — Encounter: Payer: Self-pay | Admitting: Family Medicine

## 2022-03-21 VITALS — BP 121/80 | HR 64 | Temp 98.1°F | Ht 68.0 in | Wt 176.2 lb

## 2022-03-21 DIAGNOSIS — H65193 Other acute nonsuppurative otitis media, bilateral: Secondary | ICD-10-CM | POA: Diagnosis not present

## 2022-03-21 MED ORDER — PREDNISONE 20 MG PO TABS: 40.0000 mg | ORAL_TABLET | Freq: Every day | ORAL | 0 refills | Status: AC

## 2022-03-21 NOTE — Progress Notes (Signed)
Chief Complaint  Patient presents with   Ear Fullness    Unable to hear out of right ear    Subjective: Patient is a 51 y.o. female here for UC f/u.  1 week ago, felt pressure in R ear. Had some pain and went to UC. Strep test +, tx'd w amox and throat improved. Started using Flonase over past 5 d. Sometimes will have stabbing pain. Does not use Q tips. Nothing got in her ear that she is aware of.   Past Medical History:  Diagnosis Date   Allergy    seasonal allergies   Arthritis    osteoarthritis in bilateral hands   Complication of anesthesia    trouble waking up after second myomectomy   Depression    hx of situational depression   Dermatitis    Diabetes mellitus without complication ([place])    Dysrhythmia    resolved- "grew out of it" - no problem since teenager   Genital warts    removed   GERD (gastroesophageal reflux disease)    hx-bacterial ulcer in 2004   Gestational diabetes    Hypertension 2016   [place] with 2016 pregnancy / no BP meds since birth of child   Lichen sclerosus    Newborn product of in vitro fertilization (IVF) pregnancy    Pregnancy induced hypertension    Termination of pregnancy (fetus)    x 1   Thyroid cancer ([place])    thyroid CA in 2016-sx    Objective: BP 121/80   Pulse 64   Temp 98.1 F (36.7 C) (Oral)   Ht '5\' 8"'$  (1.727 m)   Wt 176 lb 4 oz (79.9 kg)   SpO2 95%   BMI 26.80 kg/m  General: Awake, appears stated age HEENT: R TM erythematous w serous fluid, L TM without erythema, small amount of serous fluid. Canals patent w/o dc. Nares patent w/o rhinorrhea. MMM, no pharyngeal exudate or erythema.  Lungs: No accessory muscle use Psych: Age appropriate judgment and insight, normal affect and mood  Assessment and Plan: Acute effusion of both middle ears - Plan: predniSONE (DELTASONE) 20 MG tablet  5 d pred burst 40 mg/d. Will cont amox for 10 total days. Send message in 2-3 d if no better. Will consider changing abx.  The patient voiced  understanding and agreement to the plan.  [place], DO 03/21/22  11:29 AM

## 2022-03-21 NOTE — Patient Instructions (Signed)
Send me a message in 2-3 days if no improvement.  Finish the amoxicillin.   Let us know if you need anything.

## 2022-03-22 ENCOUNTER — Other Ambulatory Visit: Payer: Self-pay | Admitting: Family Medicine

## 2022-03-22 MED ORDER — MECLIZINE HCL 25 MG PO TABS: 25.0000 mg | ORAL_TABLET | Freq: Three times a day (TID) | ORAL | 0 refills | Status: DC | PRN

## 2022-03-23 ENCOUNTER — Other Ambulatory Visit: Payer: Self-pay | Admitting: Family Medicine

## 2022-03-23 DIAGNOSIS — H65193 Other acute nonsuppurative otitis media, bilateral: Secondary | ICD-10-CM

## 2022-03-23 DIAGNOSIS — R42 Dizziness and giddiness: Secondary | ICD-10-CM

## 2022-03-30 ENCOUNTER — Ambulatory Visit: Payer: BC Managed Care – PPO | Admitting: Physical Therapy

## 2022-04-01 ENCOUNTER — Encounter: Payer: Self-pay | Admitting: Family Medicine

## 2022-04-01 DIAGNOSIS — H938X9 Other specified disorders of ear, unspecified ear: Secondary | ICD-10-CM

## 2022-04-01 DIAGNOSIS — E1165 Type 2 diabetes mellitus with hyperglycemia: Secondary | ICD-10-CM

## 2022-04-04 MED ORDER — TIRZEPATIDE 10 MG/0.5ML ~~LOC~~ SOAJ: 10.0000 mg | SUBCUTANEOUS | 0 refills | Status: DC

## 2022-04-19 ENCOUNTER — Encounter: Payer: Self-pay | Admitting: Family Medicine

## 2022-06-27 DIAGNOSIS — L578 Other skin changes due to chronic exposure to nonionizing radiation: Secondary | ICD-10-CM | POA: Diagnosis not present

## 2022-06-27 DIAGNOSIS — I781 Nevus, non-neoplastic: Secondary | ICD-10-CM | POA: Diagnosis not present

## 2022-07-14 ENCOUNTER — Encounter: Payer: Self-pay | Admitting: Family Medicine

## 2022-07-14 ENCOUNTER — Other Ambulatory Visit: Payer: Self-pay | Admitting: Family Medicine

## 2022-07-14 DIAGNOSIS — E1165 Type 2 diabetes mellitus with hyperglycemia: Secondary | ICD-10-CM

## 2022-07-14 MED ORDER — TIRZEPATIDE 15 MG/0.5ML ~~LOC~~ SOAJ: 15.0000 mg | SUBCUTANEOUS | 2 refills | Status: DC

## 2022-07-14 MED ORDER — FREESTYLE LIBRE 3 SENSOR MISC: 1.0000 | 3 refills | Status: DC

## 2022-07-14 MED ORDER — TIRZEPATIDE 12.5 MG/0.5ML ~~LOC~~ SOAJ: 12.5000 mg | SUBCUTANEOUS | 0 refills | Status: AC

## 2022-07-14 NOTE — Telephone Encounter (Signed)
Okay to increase to [facility] '12mg'$ /0.2m.

## 2022-07-15 DIAGNOSIS — Z01419 Encounter for gynecological examination (general) (routine) without abnormal findings: Secondary | ICD-10-CM | POA: Diagnosis not present

## 2022-07-15 DIAGNOSIS — Z1151 Encounter for screening for human papillomavirus (HPV): Secondary | ICD-10-CM | POA: Diagnosis not present

## 2022-07-15 DIAGNOSIS — L900 Lichen sclerosus et atrophicus: Secondary | ICD-10-CM | POA: Diagnosis not present

## 2022-07-26 ENCOUNTER — Encounter: Payer: Self-pay | Admitting: Family Medicine

## 2022-07-29 ENCOUNTER — Telehealth: Payer: Self-pay

## 2022-07-29 NOTE — Telephone Encounter (Signed)
PA initiated via Covermymeds; KEY: [id number]. PA approved.   Effective from 07/29/2022 through 07/28/2023.

## 2022-08-22 ENCOUNTER — Ambulatory Visit (INDEPENDENT_AMBULATORY_CARE_PROVIDER_SITE_OTHER): Payer: BC Managed Care – PPO | Admitting: Family Medicine

## 2022-08-22 ENCOUNTER — Encounter: Payer: Self-pay | Admitting: Family Medicine

## 2022-08-22 VITALS — BP 118/70 | HR 60 | Temp 98.2°F | Ht 68.0 in | Wt 144.1 lb

## 2022-08-22 DIAGNOSIS — Z0000 Encounter for general adult medical examination without abnormal findings: Secondary | ICD-10-CM | POA: Diagnosis not present

## 2022-08-22 DIAGNOSIS — E1165 Type 2 diabetes mellitus with hyperglycemia: Secondary | ICD-10-CM | POA: Diagnosis not present

## 2022-08-22 DIAGNOSIS — E890 Postprocedural hypothyroidism: Secondary | ICD-10-CM | POA: Diagnosis not present

## 2022-08-22 DIAGNOSIS — Z23 Encounter for immunization: Secondary | ICD-10-CM

## 2022-08-22 LAB — COMPREHENSIVE METABOLIC PANEL
ALT: 17 U/L (ref 0–35)
AST: 12 U/L (ref 0–37)
Albumin: 4.6 g/dL (ref 3.5–5.2)
Alkaline Phosphatase: 55 U/L (ref 39–117)
BUN: 12 mg/dL (ref 6–23)
CO2: 31 mEq/L (ref 19–32)
Calcium: 8.5 mg/dL (ref 8.4–10.5)
Chloride: 102 mEq/L (ref 96–112)
Creatinine, Ser: 0.73 mg/dL (ref 0.40–1.20)
GFR: 93.68 mL/min (ref >60.00)
Glucose, Bld: 78 mg/dL (ref 70–99)
Potassium: 4.2 mEq/L (ref 3.5–5.1)
Sodium: 139 mEq/L (ref 135–145)
Total Bilirubin: 1.3 mg/dL — ABNORMAL HIGH (ref 0.2–1.2)
Total Protein: 6.6 g/dL (ref 6.0–8.3)

## 2022-08-22 LAB — CBC
HCT: 38.7 % (ref 36.0–46.0)
Hemoglobin: 12.8 g/dL (ref 12.0–15.0)
MCHC: 33.1 g/dL (ref 30.0–36.0)
MCV: 86.9 fl (ref 78.0–100.0)
Platelets: 236 10*3/uL (ref 150.0–400.0)
RBC: 4.45 Mil/uL (ref 3.87–5.11)
RDW: 12.7 % (ref 11.5–15.5)
WBC: 8.5 10*3/uL (ref 4.0–10.5)

## 2022-08-22 LAB — LIPID PANEL
Cholesterol: 112 mg/dL (ref 0–200)
HDL: 54.3 mg/dL (ref >39.00)
LDL Cholesterol: 43 mg/dL (ref 0–99)
NonHDL: 57.57
Total CHOL/HDL Ratio: 2
Triglycerides: 73 mg/dL (ref 0.0–149.0)
VLDL: 14.6 mg/dL (ref 0.0–40.0)

## 2022-08-22 LAB — TSH: TSH: 0.05 u[IU]/mL — ABNORMAL LOW (ref 0.35–5.50)

## 2022-08-22 LAB — HEMOGLOBIN A1C: Hgb A1c MFr Bld: 5.4 % (ref 4.6–6.5)

## 2022-08-22 LAB — T4, FREE: Free T4: 1.29 ng/dL (ref 0.60–1.60)

## 2022-08-22 NOTE — Patient Instructions (Signed)
Give us 2-3 business days to get the results of your labs back.   Keep the diet clean and stay active.  Please get me a copy of your advanced directive form at your convenience.   Let us know if you need anything.  

## 2022-08-22 NOTE — Progress Notes (Signed)
Chief Complaint  Patient presents with   Annual Exam     Well Woman Pamela [name] is here for a complete physical.   Her last physical was >1 year ago.  Current diet: in general, a "healthy" diet. Current exercise: none. Weight is stable and she denies fatigue out of ordinary. Seatbelt? Yes Advanced directive? Yes  Health Maintenance Pap/HPV- Yes Mammogram- Yes Colon cancer screening-Yes Shingrix- due for 2/2 Tetanus- Yes Hep C screening- Yes HIV screening- Yes  Past Medical History:  Diagnosis Date   Allergy    seasonal allergies   Arthritis    osteoarthritis in bilateral hands   Complication of anesthesia    trouble waking up after second myomectomy   Depression    hx of situational depression   Dermatitis    Diabetes mellitus without complication ([place])    Dysrhythmia    resolved- "grew out of it" - no problem since teenager   Genital warts    removed   GERD (gastroesophageal reflux disease)    hx-bacterial ulcer in 2004   Gestational diabetes    Hypertension 2016   [place] with 2016 pregnancy / no BP meds since birth of child   Lichen sclerosus    Newborn product of in vitro fertilization (IVF) pregnancy    Pregnancy induced hypertension    Termination of pregnancy (fetus)    x 1   Thyroid cancer ([place])    thyroid CA in 2016-sx     Past Surgical History:  Procedure Laterality Date   BLADDER SURGERY  1992   Widened the urethra   CESAREAN SECTION N/A 11/04/2014   Procedure: CESAREAN SECTION;  Surgeon: [name], MD;  Location: [facility];  Service: Obstetrics;  Laterality: N/A;  EDD: 11/23/14   CESAREAN SECTION N/A 11/24/2017   Procedure: Repeat CESAREAN SECTION;  Surgeon: Brien Few, MD;  Location: [facility];  Service: Obstetrics;  Laterality: N/A;  EDD: 12/14/17 Allergy: Erythromycin, Augmentin   LYMPH NODE DISSECTION  03/12/2015   Procedure: LYMPH NODE DISSECTION;  Surgeon: Armandina Gemma, MD;  Location: [facility];  Service: General;;    myomectomy  2008   MYOMECTOMY  2014   Removed tubes bilaterally   MYOMECTOMY N/A 11/04/2014   Procedure: MYOMECTOMY;  Surgeon: [name], MD;  Location: [facility];  Service: Obstetrics;  Laterality: N/A;   THYROIDECTOMY N/A 03/12/2015   Procedure: TOTAL THYROIDECTOMY;  Surgeon: Armandina Gemma, MD;  Location: [facility];  Service: General;  Laterality: N/A;   TUBAL LIGATION     WISDOM TOOTH EXTRACTION      Medications  Current Outpatient Medications on File Prior to Visit  Medication Sig Dispense Refill   clobetasol ointment (TEMOVATE) 8.12 % Apply 1 application topically 2 (two) times daily.     Continuous Blood Gluc Sensor (FREESTYLE LIBRE 3 SENSOR) MISC Apply 1 each topically every 14 (fourteen) days. Use to check blood sugar. 6 each 3   fluticasone (FLONASE) 50 MCG/ACT nasal spray Place into both nostrils daily.     rosuvastatin (CRESTOR) 20 MG tablet Take 1 tablet (20 mg total) by mouth daily. 90 tablet 3   sertraline (ZOLOFT) 50 MG tablet Take 1 tablet (50 mg total) by mouth daily. 90 tablet 3   SYNTHROID 112 MCG tablet Take 1 tablet (112 mcg total) by mouth daily before breakfast. 45 tablet 3    Allergies Allergies  Allergen Reactions   Augmentin [Amoxicillin-Pot Clavulanate] Nausea And Vomiting   Erythromycin Nausea And Vomiting    Review of Systems:  Constitutional:  no unexpected weight changes Eye:  no recent significant change in vision Ear/Nose/Mouth/Throat:  Ears:  no recent change in hearing Nose/Mouth/Throat:  no complaints of nasal congestion, no sore throat Cardiovascular: no chest pain Respiratory:  no shortness of breath Gastrointestinal:  no abdominal pain, no change in bowel habits GU:  Female: negative for dysuria or pelvic pain Musculoskeletal/Extremities:  no pain of the joints Integumentary (Skin/Breast):  no abnormal skin lesions reported Neurologic:  no headaches Endocrine:  denies fatigue  Exam BP 118/70 (BP Location: Left Arm, Patient Position:  Sitting, Cuff Size: Normal)   Pulse 60   Temp 98.2 F (36.8 C) (Oral)   Ht '5\' 8"'$  (1.727 m)   Wt 144 lb 2 oz (65.4 kg)   SpO2 97%   BMI 21.91 kg/m  General:  well developed, well nourished, in no apparent distress Skin:  no significant moles, warts, or growths Head:  no masses, lesions, or tenderness Eyes:  pupils equal and round, sclera anicteric without injection Ears:  canals without lesions, TMs shiny without retraction, no obvious effusion, no erythema Nose:  nares patent, mucosa normal, and no drainage  Throat/Pharynx:  lips and gingiva without lesion; tongue and uvula midline; non-inflamed pharynx; no exudates or postnasal drainage Neck: neck supple without adenopathy, thyromegaly, or masses Lungs:  clear to auscultation, breath sounds equal bilaterally, no respiratory distress Cardio:  regular rate and rhythm, no LE edema Abdomen:  abdomen soft, nontender; bowel sounds normal; no masses or organomegaly Genital: Defer to GYN Musculoskeletal:  symmetrical muscle groups noted without atrophy or deformity Extremities:  no clubbing, cyanosis, or edema, no deformities, no skin discoloration Neuro:  gait normal; deep tendon reflexes normal and symmetric Psych: well oriented with normal range of affect and appropriate judgment/insight  Assessment and Plan  Well adult exam - Plan: CBC, Comprehensive metabolic panel, Lipid panel  Type 2 diabetes mellitus with hyperglycemia, without long-term current use of insulin (HCC) - Plan: Hemoglobin A1c  Postsurgical hypothyroidism - Plan: TSH, T4, free   Well 51 y.o. female. Counseled on diet and exercise. Advanced directive form requested today.  Shingrix 2/2 today. Other orders as above. Follow up 6 mo. The patient voiced understanding and agreement to the plan.  [place], DO 08/22/22 10:50 AM

## 2022-08-22 NOTE — Addendum Note (Signed)
Addended by: Sharon Seller B on: 08/22/2022 10:56 AM   Modules accepted: Orders

## 2022-08-23 ENCOUNTER — Encounter: Payer: Self-pay | Admitting: Internal Medicine

## 2022-08-23 ENCOUNTER — Other Ambulatory Visit: Payer: Self-pay | Admitting: Internal Medicine

## 2022-08-23 MED ORDER — SYNTHROID 100 MCG PO TABS: 100.0000 ug | ORAL_TABLET | Freq: Every day | ORAL | 3 refills | Status: DC

## 2022-09-11 ENCOUNTER — Encounter: Payer: Self-pay | Admitting: Family Medicine

## 2022-09-12 ENCOUNTER — Other Ambulatory Visit: Payer: Self-pay | Admitting: Family Medicine

## 2022-09-12 MED ORDER — TIRZEPATIDE 10 MG/0.5ML ~~LOC~~ SOAJ: 10.0000 mg | SUBCUTANEOUS | 2 refills | Status: DC

## 2022-09-21 DIAGNOSIS — Z1231 Encounter for screening mammogram for malignant neoplasm of breast: Secondary | ICD-10-CM | POA: Diagnosis not present

## 2022-09-22 ENCOUNTER — Encounter: Payer: Self-pay | Admitting: Family Medicine

## 2022-10-06 ENCOUNTER — Encounter: Payer: Self-pay | Admitting: Family Medicine

## 2022-10-07 ENCOUNTER — Telehealth: Payer: BC Managed Care – PPO | Admitting: Medical

## 2022-10-31 DIAGNOSIS — L218 Other seborrheic dermatitis: Secondary | ICD-10-CM | POA: Diagnosis not present

## 2022-10-31 DIAGNOSIS — R21 Rash and other nonspecific skin eruption: Secondary | ICD-10-CM | POA: Diagnosis not present

## 2022-11-28 ENCOUNTER — Other Ambulatory Visit: Payer: Self-pay | Admitting: Family Medicine

## 2022-11-28 DIAGNOSIS — E1165 Type 2 diabetes mellitus with hyperglycemia: Secondary | ICD-10-CM

## 2022-11-28 MED ORDER — ROSUVASTATIN CALCIUM 20 MG PO TABS: 20.0000 mg | ORAL_TABLET | Freq: Every day | ORAL | 3 refills | Status: DC

## 2023-01-03 ENCOUNTER — Encounter: Payer: Self-pay | Admitting: Family Medicine

## 2023-01-03 ENCOUNTER — Other Ambulatory Visit: Payer: Self-pay | Admitting: Family Medicine

## 2023-01-03 MED ORDER — TIRZEPATIDE 7.5 MG/0.5ML ~~LOC~~ SOAJ: 7.5000 mg | SUBCUTANEOUS | 2 refills | Status: DC

## 2023-01-04 ENCOUNTER — Other Ambulatory Visit (HOSPITAL_BASED_OUTPATIENT_CLINIC_OR_DEPARTMENT_OTHER): Payer: Self-pay

## 2023-01-04 MED ORDER — TIRZEPATIDE 7.5 MG/0.5ML ~~LOC~~ SOAJ
7.5000 mg | SUBCUTANEOUS | 2 refills | Status: DC
  Filled 2023-01-04: qty 2, 28d supply, fill #0
  Filled 2023-01-27: qty 2, 28d supply, fill #1

## 2023-01-20 ENCOUNTER — Ambulatory Visit (INDEPENDENT_AMBULATORY_CARE_PROVIDER_SITE_OTHER): Payer: BC Managed Care – PPO | Admitting: Family Medicine

## 2023-01-20 ENCOUNTER — Encounter: Payer: Self-pay | Admitting: Family Medicine

## 2023-01-20 VITALS — BP 120/80 | HR 67 | Temp 97.7°F | Ht 68.0 in | Wt 136.2 lb

## 2023-01-20 DIAGNOSIS — S46819A Strain of other muscles, fascia and tendons at shoulder and upper arm level, unspecified arm, initial encounter: Secondary | ICD-10-CM | POA: Diagnosis not present

## 2023-01-20 DIAGNOSIS — Z23 Encounter for immunization: Secondary | ICD-10-CM | POA: Diagnosis not present

## 2023-01-20 MED ORDER — TIZANIDINE HCL 4 MG PO TABS: 4.0000 mg | ORAL_TABLET | Freq: Four times a day (QID) | ORAL | 0 refills | Status: DC | PRN
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
## 2023-01-20 NOTE — Addendum Note (Signed)
Addended by: Scharlene Gloss B on: 01/20/2023 03:51 PM   Modules accepted: Orders

## 2023-01-20 NOTE — Patient Instructions (Addendum)
Ice/cold pack over area for 10-15 min twice daily.  Heat (pad or rice pillow in microwave) over affected area, 10-15 minutes twice daily.   OK to take Tylenol 1000 mg (2 extra strength tabs) or 975 mg (3 regular strength tabs) every 6 hours as needed.  Take the muscle relaxer 1-2 hours before planned bedtime. If it makes you drowsy, do not take during the day. You can try half a tab the following night.  Let us know if you need anything.  Trapezius stretches/exercises Do exercises exactly as told by your health care provider and adjust them as directed. It is normal to feel mild stretching, pulling, tightness, or discomfort as you do these exercises, but you should stop right away if you feel sudden pain or your pain gets worse.   Stretching and range of motion exercises These exercises warm up your muscles and joints and improve the movement and flexibility of your shoulder. These exercises can also help to relieve pain, numbness, and tingling. If you are unable to do any of the following for any reason, do not further attempt to do it.   Exercise A: Flexion, standing     Stand and hold a broomstick, a cane, or a similar object. Place your hands a little more than shoulder-width apart on the object. Your left / right hand should be palm-up, and your other hand should be palm-down. Push the stick to raise your left / right arm out to your side and then over your head. Use your other hand to help move the stick. Stop when you feel a stretch in your shoulder, or when you reach the angle that is recommended by your health care provider. Avoid shrugging your shoulder while you raise your arm. Keep your shoulder blade tucked down toward your spine. Hold for 30 seconds. Slowly return to the starting position. Repeat 2 times. Complete this exercise 3 times per week.  Exercise B: Abduction, supine     Lie on your back and hold a broomstick, a cane, or a similar object. Place your hands a little  more than shoulder-width apart on the object. Your left / right hand should be palm-up, and your other hand should be palm-down. Push the stick to raise your left / right arm out to your side and then over your head. Use your other hand to help move the stick. Stop when you feel a stretch in your shoulder, or when you reach the angle that is recommended by your health care provider. Avoid shrugging your shoulder while you raise your arm. Keep your shoulder blade tucked down toward your spine. Hold for 30 seconds. Slowly return to the starting position. Repeat 2 times. Complete this exercise 3 times per week.  Exercise C: Flexion, active-assisted     Lie on your back. You may bend your knees for comfort. Hold a broomstick, a cane, or a similar object. Place your hands about shoulder-width apart on the object. Your palms should face toward your feet. Raise the stick and move your arms over your head and behind your head, toward the floor. Use your healthy arm to help your left / right arm move farther. Stop when you feel a gentle stretch in your shoulder, or when you reach the angle where your health care provider tells you to stop. Hold for 30 seconds. Slowly return to the starting position. Repeat 2 times. Complete this exercise 3 times per week.  Exercise D: External rotation and abduction  Stand in a door frame with one of your feet slightly in front of the other. This is called a staggered stance. Choose one of the following positions as told by your health care provider: Place your hands and forearms on the door frame above your head. Place your hands and forearms on the door frame at the height of your head. Place your hands on the door frame at the height of your elbows. Slowly move your weight onto your front foot until you feel a stretch across your chest and in the front of your shoulders. Keep your head and chest upright and keep your abdominal muscles tight. Hold for 30  seconds. To release the stretch, shift your weight to your back foot. Repeat 2 times. Complete this stretch 3 times per week.  Strengthening exercises These exercises build strength and endurance in your shoulder. Endurance is the ability to use your muscles for a long time, even after your muscles get tired. Exercise E: Scapular depression and adduction  Sit on a stable chair. Support your arms in front of you with pillows, armrests, or a tabletop. Keep your elbows in line with the sides of your body. Gently move your shoulder blades down toward your middle back. Relax the muscles on the tops of your shoulders and in the back of your neck. Hold for 3 seconds. Slowly release the tension and relax your muscles completely before doing this exercise again. Repeat for a total of 10 repetitions. After you have practiced this exercise, try doing the exercise without the arm support. Then, try the exercise while standing instead of sitting. Repeat 2 times. Complete this exercise 3 times per week.  Exercise F: Shoulder abduction, isometric     Stand or sit about 4-6 inches (10-15 cm) from a wall with your left / right side facing the wall. Bend your left / right elbow and gently press your elbow against the wall. Increase the pressure slowly until you are pressing as hard as you can without shrugging your shoulder. Hold for 3 seconds. Slowly release the tension and relax your muscles completely. Repeat for a total of 10 repetitions. Repeat 2 times. Complete this exercise 3 times per week.  Exercise G: Shoulder flexion, isometric     Stand or sit about 4-6 inches (10-15 cm) away from a wall with your left / right side facing the wall. Keep your left / right elbow straight and gently press the top of your fist against the wall. Increase the pressure slowly until you are pressing as hard as you can without shrugging your shoulder. Hold for 10-15 seconds. Slowly release the tension and relax your  muscles completely. Repeat for a total of 10 repetitions. Repeat 2 times. Complete this exercise 3 times per week.  Exercise H: Internal rotation     Sit in a stable chair without armrests, or stand. Secure an exercise band at your left / right side, at elbow height. Place a soft object, such as a folded towel or a small pillow, under your left / right upper arm so your elbow is a few inches (about 8 cm) away from your side. Hold the end of the exercise band so the band stretches. Keeping your elbow pressed against the soft object under your arm, move your forearm across your body toward your abdomen. Keep your body steady so the movement is only coming from your shoulder. Hold for 3 seconds. Slowly return to the starting position. Repeat for a total of 10 repetitions.  Repeat 2 times. Complete this exercise 3 times per week.  Exercise I: External rotation     Sit in a stable chair without armrests, or stand. Secure an exercise band at your left / right side, at elbow height. Place a soft object, such as a folded towel or a small pillow, under your left / right upper arm so your elbow is a few inches (about 8 cm) away from your side. Hold the end of the exercise band so the band stretches. Keeping your elbow pressed against the soft object under your arm, move your forearm out, away from your abdomen. Keep your body steady so the movement is only coming from your shoulder. Hold for 3 seconds. Slowly return to the starting position. Repeat for a total of 10 repetitions. Repeat 2 times. Complete this exercise 3 times per week. Exercise J: Shoulder extension  Sit in a stable chair without armrests, or stand. Secure an exercise band to a stable object in front of you so the band is at shoulder height. Hold one end of the exercise band in each hand. Your palms should face each other. Straighten your elbows and lift your hands up to shoulder height. Step back, away from the secured end of the  exercise band, until the band stretches. Squeeze your shoulder blades together and pull your hands down to the sides of your thighs. Stop when your hands are straight down by your sides. Do not let your hands go behind your body. Hold for 3 seconds. Slowly return to the starting position. Repeat for a total of 10 repetitions. Repeat 2 times. Complete this exercise 3 times per week.  Exercise K: Shoulder extension, prone     Lie on your abdomen on a firm surface so your left / right arm hangs over the edge. Hold a 5 lb weight in your hand so your palm faces in toward your body. Your arm should be straight. Squeeze your shoulder blade down toward the middle of your back. Slowly raise your arm behind you, up to the height of the surface that you are lying on. Keep your arm straight. Hold for 3 seconds. Slowly return to the starting position and relax your muscles. Repeat for a total of 10 repetitions. Repeat 2 times. Complete this exercise 3 times per week.   Exercise L: Horizontal abduction, prone  Lie on your abdomen on a firm surface so your left / right arm hangs over the edge. Hold a 5 lb weight in your hand so your palm faces toward your feet. Your arm should be straight. Squeeze your shoulder blade down toward the middle of your back. Bend your elbow so your hand moves up, until your elbow is bent to an "L" shape (90 degrees). With your elbow bent, slowly move your forearm forward and up. Raise your hand up to the height of the surface that you are lying on. Your upper arm should not move, and your elbow should stay bent. At the top of the movement, your palm should face the floor. Hold for 3 seconds. Slowly return to the starting position and relax your muscles. Repeat for a total of 10 repetitions. Repeat 2 times. Complete this exercise 3 times per week.  Exercise M: Horizontal abduction, standing  Sit on a stable chair, or stand. Secure an exercise band to a stable object in  front of you so the band is at shoulder height. Hold one end of the exercise band in each hand. Straighten your elbows  and lift your hands straight in front of you, up to shoulder height. Your palms should face down, toward the floor. Step back, away from the secured end of the exercise band, until the band stretches. Move your arms out to your sides, and keep your arms straight. Hold for 3 seconds. Slowly return to the starting position. Repeat for a total of 10 repetitions. Repeat 2 times. Complete this exercise 3 times per week.  Exercise N: Scapular retraction and elevation  Sit on a stable chair, or stand. Secure an exercise band to a stable object in front of you so the band is at shoulder height. Hold one end of the exercise band in each hand. Your palms should face each other. Sit in a stable chair without armrests, or stand. Step back, away from the secured end of the exercise band, until the band stretches. Squeeze your shoulder blades together and lift your hands over your head. Keep your elbows straight. Hold for 3 seconds. Slowly return to the starting position. Repeat for a total of 10 repetitions. Repeat 2 times. Complete this exercise 3 times per week.  This information is not intended to replace advice given to you by your health care provider. Make sure you discuss any questions you have with your health care provider. Document Released: 09/26/2005 Document Revised: 06/02/2016 Document Reviewed: 08/13/2015 Elsevier Interactive Patient Education  2017 [employer].

## 2023-01-27 ENCOUNTER — Other Ambulatory Visit (HOSPITAL_BASED_OUTPATIENT_CLINIC_OR_DEPARTMENT_OTHER): Payer: Self-pay

## 2023-01-31 ENCOUNTER — Other Ambulatory Visit (HOSPITAL_BASED_OUTPATIENT_CLINIC_OR_DEPARTMENT_OTHER): Payer: Self-pay

## 2023-02-02 ENCOUNTER — Other Ambulatory Visit: Payer: Self-pay

## 2023-02-02 ENCOUNTER — Other Ambulatory Visit (HOSPITAL_BASED_OUTPATIENT_CLINIC_OR_DEPARTMENT_OTHER): Payer: Self-pay

## 2023-02-06 ENCOUNTER — Telehealth: Payer: Self-pay

## 2023-02-06 NOTE — Telephone Encounter (Signed)
Patient made aware.

## 2023-02-06 NOTE — Telephone Encounter (Signed)
PA cancelled.   Canceled. Please note that authorization is already on file for the requested medication and is effective through (07/29/2022 to 07/28/2023). thank you

## 2023-02-06 NOTE — Telephone Encounter (Signed)
PA initiated via Covermymeds; KEY: [id number]. Awaiting determination.

## 2023-02-20 ENCOUNTER — Ambulatory Visit (INDEPENDENT_AMBULATORY_CARE_PROVIDER_SITE_OTHER): Payer: BC Managed Care – PPO | Admitting: Family Medicine

## 2023-02-20 ENCOUNTER — Encounter: Payer: Self-pay | Admitting: Family Medicine

## 2023-02-20 ENCOUNTER — Other Ambulatory Visit (HOSPITAL_BASED_OUTPATIENT_CLINIC_OR_DEPARTMENT_OTHER): Payer: Self-pay

## 2023-02-20 VITALS — BP 108/60 | HR 58 | Temp 98.0°F | Ht 68.0 in | Wt 136.2 lb

## 2023-02-20 DIAGNOSIS — E1165 Type 2 diabetes mellitus with hyperglycemia: Secondary | ICD-10-CM | POA: Diagnosis not present

## 2023-02-20 DIAGNOSIS — F411 Generalized anxiety disorder: Secondary | ICD-10-CM | POA: Diagnosis not present

## 2023-02-20 DIAGNOSIS — F321 Major depressive disorder, single episode, moderate: Secondary | ICD-10-CM | POA: Diagnosis not present

## 2023-02-20 DIAGNOSIS — Z7985 Long-term (current) use of injectable non-insulin antidiabetic drugs: Secondary | ICD-10-CM | POA: Diagnosis not present

## 2023-02-20 LAB — COMPREHENSIVE METABOLIC PANEL
ALT: 29 U/L (ref 0–35)
AST: 23 U/L (ref 0–37)
Albumin: 4.5 g/dL (ref 3.5–5.2)
Alkaline Phosphatase: 62 U/L (ref 39–117)
BUN: 12 mg/dL (ref 6–23)
CO2: 29 mEq/L (ref 19–32)
Calcium: 8 mg/dL — ABNORMAL LOW (ref 8.4–10.5)
Chloride: 101 mEq/L (ref 96–112)
Creatinine, Ser: 0.87 mg/dL (ref 0.40–1.20)
GFR: 75.63 mL/min (ref >60.00)
Glucose, Bld: 71 mg/dL (ref 70–99)
Potassium: 4.4 mEq/L (ref 3.5–5.1)
Sodium: 140 mEq/L (ref 135–145)
Total Bilirubin: 1.2 mg/dL (ref 0.2–1.2)
Total Protein: 6.7 g/dL (ref 6.0–8.3)

## 2023-02-20 LAB — LIPID PANEL
Cholesterol: 130 mg/dL (ref 0–200)
HDL: 66 mg/dL (ref >39.00)
LDL Cholesterol: 54 mg/dL (ref 0–99)
NonHDL: 64.03
Total CHOL/HDL Ratio: 2
Triglycerides: 51 mg/dL (ref 0.0–149.0)
VLDL: 10.2 mg/dL (ref 0.0–40.0)

## 2023-02-20 LAB — HEMOGLOBIN A1C: Hgb A1c MFr Bld: 5.3 % (ref 4.6–6.5)

## 2023-02-20 LAB — MICROALBUMIN / CREATININE URINE RATIO
Creatinine,U: 25.4 mg/dL
Microalb Creat Ratio: 2.8 mg/g (ref 0.0–30.0)
Microalb, Ur: <0.7 mg/dL (ref 0.0–1.9)

## 2023-02-20 MED ORDER — SERTRALINE HCL 50 MG PO TABS
50.0000 mg | ORAL_TABLET | Freq: Every day | ORAL | 3 refills | Status: DC
  Filled 2023-02-20: qty 90, 90d supply, fill #0
  Filled 2023-05-16: qty 90, 90d supply, fill #1

## 2023-02-20 MED ORDER — TIRZEPATIDE 10 MG/0.5ML ~~LOC~~ SOAJ
10.0000 mg | SUBCUTANEOUS | 3 refills | Status: DC
  Filled 2023-02-20: qty 2, 28d supply, fill #0
  Filled 2023-03-14 – 2023-03-22 (×2): qty 2, 28d supply, fill #1
  Filled 2023-04-19: qty 2, 28d supply, fill #2
  Filled 2023-05-15: qty 2, 28d supply, fill #3

## 2023-02-20 NOTE — Progress Notes (Signed)
Subjective:   Chief Complaint  Patient presents with   Follow-up    6 month     Pamela [name] is a 52 y.o. female here for follow-up of diabetes.   [name] has a CGM.  Patient does not require insulin.   Medications include: Mounjaro 7.5 mg/week.  Diet is healthy.  Exercise: pilates  GAD Hx of, on Zoloft 50 mg/d. Compliant, no AE's. No SI or HI. No self medication. She is not following with a therapist.   Past Medical History:  Diagnosis Date   Allergy    seasonal allergies   Arthritis    osteoarthritis in bilateral hands   Complication of anesthesia    trouble waking up after second myomectomy   Depression    hx of situational depression   Dermatitis    Diabetes mellitus without complication ([facility])    Dysrhythmia    resolved- "grew out of it" - no problem since teenager   Genital warts    removed   GERD (gastroesophageal reflux disease)    hx-bacterial ulcer in 2004   Gestational diabetes    Hypertension 2016   PIH with 2016 pregnancy / no BP meds since birth of child   Lichen sclerosus    Newborn product of in vitro fertilization (IVF) pregnancy    Pregnancy induced hypertension    Termination of pregnancy (fetus)    x 1   Thyroid cancer ([facility])    thyroid CA in 2016-sx     Related testing: Retinal exam: Done Pneumovax: not done  Objective:  BP 108/60 (BP Location: Left Arm, Patient Position: Sitting, Cuff Size: Normal)   Pulse (!) 58   Temp 98 F (36.7 C) (Oral)   Ht 5\' 8"  (1.727 m)   Wt 136 lb 4 oz (61.8 kg)   SpO2 98%   BMI 20.72 kg/m  General:  Well developed, well nourished, in no apparent distress Skin:  Warm, no pallor or diaphoresis Lungs:  CTAB, no access msc use Cardio:  RRR, no bruits, no LE edema Musculoskeletal:  Symmetrical muscle groups noted without atrophy or deformity Neuro:  Sensation intact to pinprick on feet Psych: Age appropriate judgment and insight  Assessment:   Type 2 diabetes mellitus with hyperglycemia, without  long-term current use of insulin ([facility]CC) - Plan: Comprehensive metabolic panel, Lipid panel, Hemoglobin A1c, Microalbumin / creatinine urine ratio  GAD (generalized anxiety disorder) - Plan: sertraline (ZOLOFT) 50 MG tablet  Depression, major, single episode, moderate ([facility]CC) - Plan: sertraline (ZOLOFT) 50 MG tablet   Plan:   Chronic stable. Increase Mounjaro to 10 mg/week as she is having more cravings at the 7.5 mg weekly dosage. Counseled on diet and exercise. 2/3. Chronic, stable. Cont Zoloft 50 mg/d.  F/u in 6 mo. The patient voiced understanding and agreement to the plan.  Pamela Roche [place], DO 02/20/23 10:25 AM

## 2023-02-20 NOTE — Patient Instructions (Signed)
Give us 2-3 business days to get the results of your labs back.   Keep the diet clean and stay active.  Let us know if you need anything. 

## 2023-02-23 ENCOUNTER — Encounter: Payer: Self-pay | Admitting: Internal Medicine

## 2023-02-24 MED ORDER — SYNTHROID 100 MCG PO TABS: 100.0000 ug | ORAL_TABLET | Freq: Every day | ORAL | 0 refills | Status: DC

## 2023-03-14 ENCOUNTER — Other Ambulatory Visit (HOSPITAL_BASED_OUTPATIENT_CLINIC_OR_DEPARTMENT_OTHER): Payer: Self-pay

## 2023-03-18 ENCOUNTER — Other Ambulatory Visit (HOSPITAL_BASED_OUTPATIENT_CLINIC_OR_DEPARTMENT_OTHER): Payer: Self-pay

## 2023-03-22 ENCOUNTER — Other Ambulatory Visit (HOSPITAL_BASED_OUTPATIENT_CLINIC_OR_DEPARTMENT_OTHER): Payer: Self-pay

## 2023-03-22 ENCOUNTER — Encounter: Payer: Self-pay | Admitting: Family Medicine

## 2023-03-22 DIAGNOSIS — E1165 Type 2 diabetes mellitus with hyperglycemia: Secondary | ICD-10-CM

## 2023-03-22 MED ORDER — ROSUVASTATIN CALCIUM 20 MG PO TABS
20.0000 mg | ORAL_TABLET | Freq: Every day | ORAL | 3 refills | Status: DC
Start: 2023-03-22 — End: 2024-04-01
  Filled 2023-03-22: qty 90, 90d supply, fill #0
  Filled 2023-06-21: qty 90, 90d supply, fill #1
  Filled 2023-09-19: qty 90, 90d supply, fill #2
  Filled 2023-12-24: qty 90, 90d supply, fill #3

## 2023-03-27 ENCOUNTER — Ambulatory Visit (INDEPENDENT_AMBULATORY_CARE_PROVIDER_SITE_OTHER): Payer: BC Managed Care – PPO | Admitting: Internal Medicine

## 2023-03-27 ENCOUNTER — Encounter: Payer: Self-pay | Admitting: Internal Medicine

## 2023-03-27 VITALS — BP 120/80 | HR 60 | Ht 68.0 in | Wt 135.2 lb

## 2023-03-27 DIAGNOSIS — C73 Malignant neoplasm of thyroid gland: Secondary | ICD-10-CM | POA: Diagnosis not present

## 2023-03-27 DIAGNOSIS — E890 Postprocedural hypothyroidism: Secondary | ICD-10-CM | POA: Diagnosis not present

## 2023-03-27 DIAGNOSIS — E559 Vitamin D deficiency, unspecified: Secondary | ICD-10-CM | POA: Diagnosis not present

## 2023-03-27 DIAGNOSIS — E892 Postprocedural hypoparathyroidism: Secondary | ICD-10-CM

## 2023-03-27 DIAGNOSIS — R7303 Prediabetes: Secondary | ICD-10-CM

## 2023-03-27 LAB — TSH: TSH: 0.18 u[IU]/mL — ABNORMAL LOW (ref 0.35–5.50)

## 2023-03-27 LAB — T4, FREE: Free T4: 1.11 ng/dL (ref 0.60–1.60)

## 2023-03-27 LAB — VITAMIN D 25 HYDROXY (VIT D DEFICIENCY, FRACTURES): VITD: 45.04 ng/mL (ref 30.00–100.00)

## 2023-03-27 NOTE — Progress Notes (Addendum)
Patient ID: Pamela [name], female   DOB: 1971-02-26, 52 y.o.   MRN: [id number]  HPI  Pamela [name] is a 52 y.o.-year-old female, initially referred by Dr. Seymour [name], presenting for follow-up for h/o papillary thyroid cancer,postsurgical hypothyroidism and also postsurgical hypoparathyroidism. Last visit 1 year ago.  Interim history: Before last visit, she lost 30 pounds - started to lose weight on compounded Ozempic, then on Mounjaro.  She was feeling great and her cravings disappeared.  Since then, she lost 10 more pounds.  She started to reduce the dose of Mounjaro. At last visit, she occasional tingling in her body, resolved by taking calcium tablets.  She takes calcium with dinner, but misses doses due to the large tablet size.  Reviewed her PTC cancer history: She had an exam with ObGyn on 12/15/2014 >> Dr. Seymour [name] felt a lump in neck.  Thyroid U/S (02/23/2015): Hypoechoic, solid right-sided isthmus nodule measures approximately 1.4 x 0.6 x 1.0 cm. There are a few scattered areas of peripheral calcification in this nodule.    FNA (02/25/2015): FINDINGS CONSISTENT WITH PAPILLARY CARCINOMA (BETHESDA CATEGORY VI).  Thyroidectomy (03/12/2015): 1. Thyroid, thyroidectomy, total thyroid sutures right lobe - MULTIFOCAL PAPILLARY THYROID CARCINOMA, TWO FOCI MEASURING 1.1 CM AND 0.6 CM IN GREATEST DIMENSION. - MARGINS ARE NEGATIVE. - ONE BENIGN LYMPH NODE WITH NO TUMOR SEEN (0/1). - SEE ONCOLOGY TEMPLATE. ADDITIONAL FINDINGS: - INCIDENTAL BENIGN PARATHYROID TISSUE (0.3 CM). 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 2. Lymph nodes, regional resection, central compartment - THREE BENIGN LYMPH NODES WITH NO TUMOR SEEN (0/3). - INCIDENTAL BENIGN THYMIC TISSUE.  1. THYROID Specimen: Total thyroid with central compartment lymph nodes. Procedure: Total thyroidectomy with central compartment lymph node dissection. Specimen Integrity (intact/fragmented): Intact. Tumor focality: Multifocal, two  foci.  Dominant tumor: Maximum tumor size (cm): 1.1 cm Tumor laterality: Isthmus (central). Histologic type (including subtype and/or unique features as applicable): Papillary thyroid carcinoma, conventional type. Tumor capsule: No tumor capsule identified. Extrathyroidal extension: No. Margins: Negative. Lymph - Vascular invasion: Not identified. Capsular invasion with degree of invasion if present: Not applicable.  Second tumor: Tumor size(s): 0.6 cm Tumor laterality: Right superior. Histologic type (including subtype and/or unique features as applicable): Papillary thyroid carcinoma, conventional type. Tumor capsule: No tumor capsule identified. Extrathyroidal extension: No. Margins: Negative. Lymph - Vascular invasion: Not identified. Capsular invasion with degree of invasion if present: Not applicable. Lymph nodes: # examined 4; # positive; 0.  She did not have RAI treatment due to small sizes of the tumors.  04/11/2016: Neck U/S:  No evidence of tissue in the right or left thyroid beds to suggest recurrence of residual thyroid tissue.  No evidence of abnormal adenopathy.  04/06/2020: Neck U/S:  No abnormal masses in the neck, no signs of cancer recurrence.  Her thyroglobulin's are low and her globulin antibodies are not elevated: Lab Results  Component Value Date   THYROGLB 0.1 (L) 03/15/2022   THYROGLB 0.3 (L) 03/11/2021   THYROGLB 0.2 (L) 03/10/2020   THYROGLB 0.2 (L) 02/14/2018   THYROGLB 0.2 (L) 02/14/2017   THYROGLB 0.1 (L) 03/17/2016   THYROGLB 0.2 (L) 09/18/2015   Lab Results  Component Value Date   THGAB <1 03/15/2022   THGAB <1 03/11/2021   THGAB <1 03/10/2020   THGAB <1 02/14/2018   THGAB <1 02/14/2017   THGAB <1 03/17/2016   THGAB <1 09/18/2015   Pt denies: - feeling nodules in neck - hoarseness - dysphagia - choking  Reviewed her TFTs: Lab Results  Component  Value Date   TSH 0.05 (L) 08/22/2022   TSH 0.07 (L) 03/15/2022   TSH 0.281 (L)  12/30/2021   TSH 7.04 (H) 03/11/2021   TSH 2.30 03/10/2020   TSH 1.750 03/29/2018   TSH 0.29 (L) 02/14/2018   FREET4 1.29 08/22/2022   FREET4 1.62 (H) 03/15/2022   FREET4 1.84 (H) 12/30/2021   FREET4 1.09 03/11/2021   FREET4 0.95 03/10/2020   FREET4 1.35 03/29/2018   FREET4 1.12 02/14/2018  10/28/2021: TSH 1.01 Received labs from Dr. Billy [name], from 08/30/2017: - TSH 0.58, free T4 1.42, both at goal  Pt is on levothyroxine (Synthroid) 100 mcg daily (last dose change 08/2022), taken: - in am - fasting - at least 30 min from b'fast - + Ca at night, no Fe, + MVI at night, no PPIs - not on Biotin  She  also has a history of HTN during previous pregnancy >> son born 10/2014.  She had IVF in 2018 >> healthy baby girl born 11/2017.   We started: -Calcium 500 mg with dinner >> then stopped, only take it as needed, 1-2 times a month >> now daily -Vitamin D 5000 units daily >> then 2500 units daily >> 5000 units daily  Reviewed pertinent labs: Lab Results  Component Value Date   PTH 14 (L) 03/10/2020   PTH Comment 03/10/2020   PTH 13 (L) 02/14/2018   PTH Comment 02/14/2018   CALCIUM 8.0 (L) 02/20/2023   CALCIUM 8.5 08/22/2022   CALCIUM 9.0 02/16/2022   CALCIUM 7.5 (L) 03/11/2021   CALCIUM 7.7 (L) 03/18/2020   CALCIUM 8.0 (L) 03/10/2020   CALCIUM 8.9 03/29/2018   CALCIUM 7.9 (L) 02/14/2018   CALCIUM 7.8 (L) 11/24/2017   CALCIUM 8.7 04/17/2015   Lab Results  Component Value Date   VD25OH 31.8 03/11/2021   VD25OH 44.63 03/10/2020   VD25OH 46.4 03/29/2018   VD25OH 19.27 (L) 02/14/2018   She mentions that she has a history of gestational diabetes with one of her pregnancies.  Most recent HbA1c was: Lab Results  Component Value Date   HGBA1C 5.3 02/20/2023   HGBA1C 5.4 08/22/2022   HGBA1C 5.6 02/16/2022   HGBA1C 6.4 03/11/2021   HGBA1C 6.4 03/18/2020  11/02/2021: HbA1c 6.5%  She has a CGM.  She is on Mounjaro per PCP.  ROS: + see HPI  I reviewed pt's medications,  allergies, PMH, social hx, family hx, and changes were documented in the history of present illness. Otherwise, unchanged from my initial visit note.  Past Medical History:  Diagnosis Date   Allergy    seasonal allergies   Arthritis    osteoarthritis in bilateral hands   Complication of anesthesia    trouble waking up after second myomectomy   Depression    hx of situational depression   Dermatitis    Diabetes mellitus without complication (HCC)    Dysrhythmia    resolved- "grew out of it" - no problem since teenager   Genital warts    removed   GERD (gastroesophageal reflux disease)    hx-bacterial ulcer in 2004   Gestational diabetes    Hypertension 2016   PIH with 2016 pregnancy / no BP meds since birth of child   Lichen sclerosus    Newborn product of in vitro fertilization (IVF) pregnancy    Pregnancy induced hypertension    Termination of pregnancy (fetus)    x 1   Thyroid cancer (HCC)    thyroid CA in 2016-sx   Past Surgical History:  Procedure Laterality Date   BLADDER SURGERY  1992   Widened the urethra   CESAREAN SECTION N/A 11/04/2014   Procedure: CESAREAN SECTION;  Surgeon: Genia Del, MD;  Location: [facility];  Service: Obstetrics;  Laterality: N/A;  EDD: 11/23/14   CESAREAN SECTION N/A 11/24/2017   Procedure: Repeat CESAREAN SECTION;  Surgeon: Olivia Mackie, MD;  Location: [facility] BIRTHING SUIT[facility];  Service: Obstetrics;  Laterality: N/A;  EDD: 12/14/17 Allergy: Erythromycin, Augmentin   LYMPH NODE DISSECTION  03/12/2015   Procedure: LYMPH NODE DISSECTION;  Surgeon: Darnell Level, MD;  Location: [facility];  Service: General;;   myomectomy  2008   MYOMECTOMY  2014   Removed tubes bilaterally   MYOMECTOMY N/A 11/04/2014   Procedure: MYOMECTOMY;  Surgeon: Genia Del, MD;  Location: [facility];  Service: Obstetrics;  Laterality: N/A;   THYROIDECTOMY N/A 03/12/2015   Procedure: TOTAL THYROIDECTOMY;  Surgeon: Darnell Level, MD;  Location: [facility];  Service: General;   Laterality: N/A;   TUBAL LIGATION     WISDOM TOOTH EXTRACTION     History   Social History   Marital Status: Married    Spouse Name: N/A   Number of Children: 1   Occupational History    accountant    Social History Main Topics   Smoking status: Former Smoker -- 1.00 packs/day for 20 years    Types: Cigarettes    Quit date: 08/11/2007   Smokeless tobacco: Never Used   Alcohol Use: Yes     Comment: Beer/liquor 1 drink once a week    Drug Use: Yes    Special: Marijuana, Cocaine     Comment: 25 years ago "Acid"  , last use cocaine/marijuana 25 yrs ago   Current Outpatient Medications on File Prior to Visit  Medication Sig Dispense Refill   clobetasol ointment (TEMOVATE) 0.05 % Apply 1 application topically 2 (two) times daily.     Continuous Blood Gluc Sensor (FREESTYLE LIBRE 3 SENSOR) MISC Apply 1 each topically every 14 (fourteen) days. Use to check blood sugar. 6 each 3   rosuvastatin (CRESTOR) 20 MG tablet Take 1 tablet (20 mg total) by mouth daily. 90 tablet 3   sertraline (ZOLOFT) 50 MG tablet Take 1 tablet (50 mg total) by mouth daily. 90 tablet 3   SYNTHROID 100 MCG tablet Take 1 tablet (100 mcg total) by mouth daily before breakfast. 45 tablet 0   tirzepatide (MOUNJARO) 10 MG/0.5ML Pen Inject 10 mg into the skin once a week. 2 mL 3   No current facility-administered medications on file prior to visit.   Allergies  Allergen Reactions   Augmentin [Amoxicillin-Pot Clavulanate] Nausea And Vomiting   Erythromycin Nausea And Vomiting   No family history available. Patient is adopted.  She has lichen sclerosus >> on Clobetasol.  PE: BP 120/80   Pulse 60   Ht 5\' 8"  (1.727 m)   Wt 135 lb 3.2 oz (61.3 kg)   SpO2 99%   BMI 20.56 kg/m    Wt Readings from Last 3 Encounters:  03/27/23 135 lb 3.2 oz (61.3 kg)  02/20/23 136 lb 4 oz (61.8 kg)  01/20/23 136 lb 4 oz (61.8 kg)   Constitutional: overweight, in NAD Eyes: EOMI, no exophthalmos ENT: no neck masses palpated,  no cervical lymphadenopathy Cardiovascular: RRR, No MRG Respiratory: CTA B Musculoskeletal: no deformities Skin: no rashes Neurological: no tremor with outstretched hands  ASSESSMENT: 1. Bifocal papillary thyroid cancer  2. Postsurgical hypothyroidism  3.  Postsurgical hypoparathyroidism  4.  Vitamin D deficiency  PLAN: 1. Papillary thyroid cancer -Reviewed her papillary thyroid cancer features: Not encapsulated Multifocal, but the second focus was on micro PTC No extension in the lymph or blood vessels  Margin is free of disease Latest neck ultrasound from 2017 showed no recurrence or metastasis in the neck Overall, she is stage I PTC, which has a very good prognosis >> no RAI treatment was necessary -The neck ultrasound from 03/2020 showed no suspicious masses in the neck, no evidence of recurrence -Thyroglobulin levels remain low, but detectable, as expected without RAI treatment.  Her antithyroglobulin antibodies are undetectable -At last visit, thyroglobulin was 0.1 -Will repeat her thyroglobulin and ATA antibodies now -also, we will repeat the neck U/S now, 3 years from the previous -I will see her back in 1 year  2. Postsurgical hypothyroidism - latest thyroid labs reviewed with pt. >> atrial suppressed so I advised her to decrease the LT4 Lab Results  Component Value Date   TSH 0.05 (L) 08/22/2022  - she continues on LT4 100 mcg daily, decreased after the above results returned.  The dose likely needed to be adjusted due to significant weight loss of 23 pounds in the previous 4 months. - pt feels good on this dose. - we discussed about taking the thyroid hormone every day, with water, >30 minutes before breakfast, separated by >4 hours from acid reflux medications, calcium, iron, multivitamins. Pt. is taking it correctly. - will check thyroid tests today: TSH and fT4 - If labs are abnormal, she will need to return for repeat TFTs in 1.5 months - OTW, I will see her  back in a year  3.  Postsurgical hypoparathyroidism and 4. vitamin D deficiency -Her PTH was low, and 14, after surgery -We restarted calcium 500 mg daily with dinner and continue to 5000 units vitamin D daily -Latest total calcium was slightly low last month -No complaints of numbness/tingling/ache or cramping when taking calcium consistently.  She tells me that she misses doses due to the large tablet size.  I advised him to get calcium gummies. -At today's visit we will recheck her vitamin D and an ionized calcium  4. Prediabetes -per PCP -on Mounjaro -Latest HbA1c was 5.3% last month -She has a history of gestational diabetes with one of her pregnancies  Needs refills - generic LT4.  Component     Latest Ref Rng 03/27/2023  T4,Free(Direct)     0.60 - 1.60 ng/dL 1.61   TSH     0.96 - 0.45 uIU/mL 0.18 (L)   VITD     30.00 - 100.00 ng/mL 45.04   Thyroglobulin     ng/mL 0.2 (L)   Comment --   Thyroglobulin Ab     < or = 1 IU/mL <1   Calcium Ionized     4.7 - 5.5 mg/dL 4.5 (L)   Ionized calcium is still low, I already advised her to try to take the calcium daily and switch to a gummy. Vitamin D level is normal. Thyroglobulin is low, and TSH is improved, but is still suppressed.  Will need to decrease the dose of levothyroxine further.  Neck U/S (05/01/2023): Post total thyroidectomy without evidence of residual or locally recurrent disease.  Carlus Pavlov, MD PhD [facility] Endocrinology

## 2023-03-27 NOTE — Patient Instructions (Addendum)
Please continue: - vitamin D 5000 units daily - calcium 500 mg daily with dinner   Please continue levothyroxine 100 mcg daily.  Take the thyroid hormone every day, with water, at least 30 minutes before breakfast, separated by at least 4 hours from: - acid reflux medications - calcium - iron - multivitamins  Please stop at the lab.  Please come back for a follow-up appointment in 1 year.

## 2023-03-28 LAB — THYROGLOBULIN LEVEL: Thyroglobulin: 0.2 ng/mL — ABNORMAL LOW

## 2023-03-28 LAB — CALCIUM, IONIZED: Calcium, Ion: 4.5 mg/dL — ABNORMAL LOW (ref 4.7–5.5)

## 2023-03-28 LAB — THYROGLOBULIN ANTIBODY: Thyroglobulin Ab: <1 IU/mL (ref <=1)

## 2023-03-29 MED ORDER — LEVOTHYROXINE SODIUM 88 MCG PO TABS: 88.0000 ug | ORAL_TABLET | Freq: Every day | ORAL | 3 refills | Status: DC

## 2023-04-02 DIAGNOSIS — J029 Acute pharyngitis, unspecified: Secondary | ICD-10-CM | POA: Diagnosis not present

## 2023-04-11 ENCOUNTER — Other Ambulatory Visit: Payer: Self-pay | Admitting: Family Medicine

## 2023-04-11 ENCOUNTER — Encounter: Payer: Self-pay | Admitting: Family Medicine

## 2023-04-11 MED ORDER — DEXCOM G7 SENSOR MISC: 1 refills | Status: DC

## 2023-04-12 ENCOUNTER — Telehealth: Payer: Self-pay

## 2023-04-12 NOTE — Telephone Encounter (Signed)
PA initiated via Covermymeds; KEY: [id number]. Awaiting determination.

## 2023-04-14 NOTE — Telephone Encounter (Signed)
PA denied. Pt is not on multiple insulin injections daily.

## 2023-04-14 NOTE — Telephone Encounter (Signed)
Updated list Called the patient left message to call back

## 2023-04-14 NOTE — Telephone Encounter (Signed)
Patient informed. 

## 2023-04-19 ENCOUNTER — Other Ambulatory Visit: Payer: BC Managed Care – PPO

## 2023-05-01 ENCOUNTER — Ambulatory Visit
Admission: RE | Admit: 2023-05-01 | Discharge: 2023-05-01 | Disposition: A | Discharge status: Home / Self Care | Payer: BC Managed Care – PPO | Source: Ambulatory Visit | Attending: Internal Medicine | Admitting: Internal Medicine

## 2023-05-01 DIAGNOSIS — Z85850 Personal history of malignant neoplasm of thyroid: Secondary | ICD-10-CM | POA: Diagnosis not present

## 2023-05-01 DIAGNOSIS — C73 Malignant neoplasm of thyroid gland: Secondary | ICD-10-CM

## 2023-05-17 ENCOUNTER — Other Ambulatory Visit (HOSPITAL_BASED_OUTPATIENT_CLINIC_OR_DEPARTMENT_OTHER): Payer: Self-pay

## 2023-05-29 ENCOUNTER — Encounter: Payer: Self-pay | Admitting: Internal Medicine

## 2023-05-29 DIAGNOSIS — C73 Malignant neoplasm of thyroid gland: Secondary | ICD-10-CM

## 2023-05-29 DIAGNOSIS — M7501 Adhesive capsulitis of right shoulder: Secondary | ICD-10-CM | POA: Diagnosis not present

## 2023-05-29 DIAGNOSIS — E890 Postprocedural hypothyroidism: Secondary | ICD-10-CM

## 2023-05-29 DIAGNOSIS — M25511 Pain in right shoulder: Secondary | ICD-10-CM | POA: Diagnosis not present

## 2023-05-31 LAB — HM DIABETES EYE EXAM

## 2023-06-16 ENCOUNTER — Other Ambulatory Visit: Payer: Self-pay | Admitting: Family Medicine

## 2023-06-16 ENCOUNTER — Other Ambulatory Visit (HOSPITAL_BASED_OUTPATIENT_CLINIC_OR_DEPARTMENT_OTHER): Payer: Self-pay

## 2023-06-16 MED ORDER — MOUNJARO 10 MG/0.5ML ~~LOC~~ SOAJ
10.0000 mg | SUBCUTANEOUS | 3 refills | Status: DC
  Filled 2023-06-16: qty 2, 28d supply, fill #0
  Filled 2023-07-12: qty 2, 28d supply, fill #1
  Filled 2023-08-11: qty 2, 28d supply, fill #2
  Filled 2023-09-06: qty 2, 28d supply, fill #3

## 2023-06-22 ENCOUNTER — Other Ambulatory Visit (HOSPITAL_BASED_OUTPATIENT_CLINIC_OR_DEPARTMENT_OTHER): Payer: Self-pay

## 2023-07-16 ENCOUNTER — Encounter: Payer: Self-pay | Admitting: Family Medicine

## 2023-07-18 ENCOUNTER — Telehealth (INDEPENDENT_AMBULATORY_CARE_PROVIDER_SITE_OTHER): Payer: BC Managed Care – PPO | Admitting: Family Medicine

## 2023-07-18 ENCOUNTER — Encounter: Payer: Self-pay | Admitting: Family Medicine

## 2023-07-18 ENCOUNTER — Other Ambulatory Visit (HOSPITAL_BASED_OUTPATIENT_CLINIC_OR_DEPARTMENT_OTHER): Payer: Self-pay

## 2023-07-18 DIAGNOSIS — F339 Major depressive disorder, recurrent, unspecified: Secondary | ICD-10-CM

## 2023-07-18 DIAGNOSIS — F411 Generalized anxiety disorder: Secondary | ICD-10-CM

## 2023-07-18 MED ORDER — SERTRALINE HCL 100 MG PO TABS
100.0000 mg | ORAL_TABLET | Freq: Every day | ORAL | 3 refills | Status: DC
Start: 2023-07-18 — End: 2023-11-26
  Filled 2023-07-18: qty 30, 30d supply, fill #0
  Filled 2023-08-19: qty 30, 30d supply, fill #1
  Filled 2023-09-25: qty 30, 30d supply, fill #2
  Filled 2023-10-24 – 2023-11-03 (×2): qty 30, 30d supply, fill #3

## 2023-07-18 NOTE — Progress Notes (Signed)
Chief Complaint  Patient presents with   increase sertraline dose    Subjective Pamela [name] presents for f/u anxiety/depression. We are interacting via web portal for an electronic face-to-face visit. I verified patient's ID using 2 identifiers. Patient agreed to proceed with visit via this method. Patient is at home, I am at office. Patient and I are present for visit.   Pt is currently being treated with Zoloft 50 mg/d.  Reports doing OK since treatment. Does not feel it is as effect as it used to be.  Having more motivation issues and lack of energy, stress/anxiety over the last 3 weeks.  No thoughts of harming self or others. No self-medication with alcohol, prescription drugs or illicit drugs. Pt is not following with a counselor/psychologist.  Past Medical History:  Diagnosis Date   Allergy    seasonal allergies   Arthritis    osteoarthritis in bilateral hands   Complication of anesthesia    trouble waking up after second myomectomy   Depression    hx of situational depression   Dermatitis    Diabetes mellitus without complication (HCC)    Dysrhythmia    resolved- "grew out of it" - no problem since teenager   Genital warts    removed   GERD (gastroesophageal reflux disease)    hx-bacterial ulcer in 2004   Gestational diabetes    Hypertension 2016   PIH with 2016 pregnancy / no BP meds since birth of child   Lichen sclerosus    Newborn product of in vitro fertilization (IVF) pregnancy    Pregnancy induced hypertension    Termination of pregnancy (fetus)    x 1   Thyroid cancer (HCC)    thyroid CA in 2016-sx   Allergies as of 07/18/2023       Reactions   Augmentin [amoxicillin-pot Clavulanate] Nausea And Vomiting   Erythromycin Nausea And Vomiting        Medication List        Accurate as of July 18, 2023  3:26 PM. If you have any questions, ask your nurse or doctor.          clobetasol ointment 0.05 % Commonly known as: TEMOVATE Apply 1  application topically 2 (two) times daily.   Dexcom G7 Receiver Devi Use to check blood sugar.  DX E.11.69   levothyroxine 88 MCG tablet Commonly known as: SYNTHROID Take 1 tablet (88 mcg total) by mouth daily.   Mounjaro 10 MG/0.5ML Pen Generic drug: tirzepatide Inject 10 mg into the skin once a week.   rosuvastatin 20 MG tablet Commonly known as: Crestor Take 1 tablet (20 mg total) by mouth daily.   sertraline 100 MG tablet Commonly known as: ZOLOFT Take 1 tablet (100 mg total) by mouth daily. What changed:  medication strength how much to take Changed by: Sharlene Dory        Exam No conversational dyspnea Age appropriate judgment and insight Nml affect and mood  Assessment and Plan  GAD (generalized anxiety disorder) - Plan: sertraline (ZOLOFT) 100 MG tablet  Depression, recurrent (HCC) - Plan: sertraline (ZOLOFT) 100 MG tablet  Chronic, uncontrolled. Increase Zoloft form 50 mg/d to 100 mg/d. Discussed exercise and counseling. F/u in 6 weeks. The patient voiced understanding and agreement to the plan.  Jilda Roche [place], DO 07/18/23 3:26 PM

## 2023-07-19 ENCOUNTER — Other Ambulatory Visit (HOSPITAL_BASED_OUTPATIENT_CLINIC_OR_DEPARTMENT_OTHER): Payer: Self-pay

## 2023-08-08 DIAGNOSIS — Z01419 Encounter for gynecological examination (general) (routine) without abnormal findings: Secondary | ICD-10-CM | POA: Diagnosis not present

## 2023-08-08 DIAGNOSIS — L900 Lichen sclerosus et atrophicus: Secondary | ICD-10-CM | POA: Diagnosis not present

## 2023-08-11 ENCOUNTER — Other Ambulatory Visit: Payer: Self-pay

## 2023-08-11 ENCOUNTER — Other Ambulatory Visit (HOSPITAL_BASED_OUTPATIENT_CLINIC_OR_DEPARTMENT_OTHER): Payer: Self-pay

## 2023-08-28 ENCOUNTER — Encounter: Payer: Self-pay | Admitting: Family Medicine

## 2023-08-28 ENCOUNTER — Ambulatory Visit (INDEPENDENT_AMBULATORY_CARE_PROVIDER_SITE_OTHER): Payer: BC Managed Care – PPO | Admitting: Family Medicine

## 2023-08-28 VITALS — BP 124/72 | HR 57 | Temp 97.7°F | Resp 16 | Ht 68.0 in | Wt 137.0 lb

## 2023-08-28 DIAGNOSIS — Z0000 Encounter for general adult medical examination without abnormal findings: Secondary | ICD-10-CM

## 2023-08-28 DIAGNOSIS — Z13220 Encounter for screening for lipoid disorders: Secondary | ICD-10-CM | POA: Diagnosis not present

## 2023-08-28 DIAGNOSIS — E1165 Type 2 diabetes mellitus with hyperglycemia: Secondary | ICD-10-CM | POA: Diagnosis not present

## 2023-08-28 DIAGNOSIS — Z23 Encounter for immunization: Secondary | ICD-10-CM

## 2023-08-28 DIAGNOSIS — E890 Postprocedural hypothyroidism: Secondary | ICD-10-CM | POA: Diagnosis not present

## 2023-08-28 LAB — TSH: TSH: 2.34 u[IU]/mL (ref 0.35–5.50)

## 2023-08-28 LAB — CBC
HCT: 40.9 % (ref 36.0–46.0)
Hemoglobin: 13.5 g/dL (ref 12.0–15.0)
MCHC: 33.1 g/dL (ref 30.0–36.0)
MCV: 88.6 fL (ref 78.0–100.0)
Platelets: 232 10*3/uL (ref 150.0–400.0)
RBC: 4.62 Mil/uL (ref 3.87–5.11)
RDW: 12.6 % (ref 11.5–15.5)
WBC: 6.7 10*3/uL (ref 4.0–10.5)

## 2023-08-28 LAB — LIPID PANEL
Cholesterol: 147 mg/dL (ref 0–200)
HDL: 73 mg/dL (ref >39.00)
LDL Cholesterol: 54 mg/dL (ref 0–99)
NonHDL: 74.18
Total CHOL/HDL Ratio: 2
Triglycerides: 101 mg/dL (ref 0.0–149.0)
VLDL: 20.2 mg/dL (ref 0.0–40.0)

## 2023-08-28 LAB — COMPREHENSIVE METABOLIC PANEL
ALT: 68 U/L — ABNORMAL HIGH (ref 0–35)
AST: 29 U/L (ref 0–37)
Albumin: 4.9 g/dL (ref 3.5–5.2)
Alkaline Phosphatase: 59 U/L (ref 39–117)
BUN: 10 mg/dL (ref 6–23)
CO2: 30 meq/L (ref 19–32)
Calcium: 8.4 mg/dL (ref 8.4–10.5)
Chloride: 101 meq/L (ref 96–112)
Creatinine, Ser: 0.85 mg/dL (ref 0.40–1.20)
GFR: 77.49 mL/min (ref >60.00)
Glucose, Bld: 76 mg/dL (ref 70–99)
Potassium: 4.2 meq/L (ref 3.5–5.1)
Sodium: 140 meq/L (ref 135–145)
Total Bilirubin: 1.5 mg/dL — ABNORMAL HIGH (ref 0.2–1.2)
Total Protein: 6.8 g/dL (ref 6.0–8.3)

## 2023-08-28 LAB — T4, FREE: Free T4: 1.09 ng/dL (ref 0.60–1.60)

## 2023-08-28 LAB — HEMOGLOBIN A1C: Hgb A1c MFr Bld: 5.3 % (ref 4.6–6.5)

## 2023-08-28 NOTE — Patient Instructions (Addendum)
Give us 2-3 business days to get the results of your labs back.   Keep the diet clean and stay active.  Please consider adding some weight resistance exercise to your routine. Consider yoga as well.   Please get me a copy of your advanced directive form at your convenience.   Let us know if you need anything.  

## 2023-08-28 NOTE — Addendum Note (Signed)
Addended by: Kathi Ludwig on: 08/28/2023 10:49 AM   Modules accepted: Orders

## 2023-08-28 NOTE — Progress Notes (Signed)
Chief Complaint  Patient presents with   Annual Exam    Annual Exam     Well Woman Pamela [name] is here for a complete physical.   Her last physical was >1 year ago.  Current diet: in general, diet has been better Current exercise: none. Weight is stable and she denies fatigue out of ordinary. Seatbelt? Yes Advanced directive? Yes  Health Maintenance Pap/HPV- Yes Mammogram- Yes Colon cancer screening-Yes Shingrix- Yes Tetanus- Yes Hep C screening- Yes HIV screening- Yes  Past Medical History:  Diagnosis Date   Allergy    seasonal allergies   Arthritis    osteoarthritis in bilateral hands   Complication of anesthesia    trouble waking up after second myomectomy   Depression    hx of situational depression   Dermatitis    Diabetes mellitus without complication (HCC)    Dysrhythmia    resolved- "grew out of it" - no problem since teenager   Genital warts    removed   GERD (gastroesophageal reflux disease)    hx-bacterial ulcer in 2004   Gestational diabetes    Hypertension 2016   PIH with 2016 pregnancy / no BP meds since birth of child   Lichen sclerosus    Newborn product of in vitro fertilization (IVF) pregnancy    Pregnancy induced hypertension    Termination of pregnancy (fetus)    x 1   Thyroid cancer (HCC)    thyroid CA in 2016-sx     Past Surgical History:  Procedure Laterality Date   BLADDER SURGERY  1992   Widened the urethra   CESAREAN SECTION N/A 11/04/2014   Procedure: CESAREAN SECTION;  Surgeon: Genia Del, MD;  Location: [facility];  Service: Obstetrics;  Laterality: N/A;  EDD: 11/23/14   CESAREAN SECTION N/A 11/24/2017   Procedure: Repeat CESAREAN SECTION;  Surgeon: Olivia Mackie, MD;  Location: [facility] SU[facility];  Service: Obstetrics;  Laterality: N/A;  EDD: 12/14/17 Allergy: Erythromycin, Augmentin   LYMPH NODE DISSECTION  03/12/2015   Procedure: LYMPH NODE DISSECTION;  Surgeon: Darnell Level, MD;  Location: [facility];  Service: General;;    myomectomy  2008   MYOMECTOMY  2014   Removed tubes bilaterally   MYOMECTOMY N/A 11/04/2014   Procedure: MYOMECTOMY;  Surgeon: Genia Del, MD;  Location: [facility];  Service: Obstetrics;  Laterality: N/A;   THYROIDECTOMY N/A 03/12/2015   Procedure: TOTAL THYROIDECTOMY;  Surgeon: Darnell Level, MD;  Location: [facility];  Service: General;  Laterality: N/A;   TUBAL LIGATION     WISDOM TOOTH EXTRACTION      Medications  Current Outpatient Medications on File Prior to Visit  Medication Sig Dispense Refill   clobetasol ointment (TEMOVATE) 0.05 % Apply 1 application topically 2 (two) times daily.     levothyroxine (SYNTHROID) 88 MCG tablet Take 1 tablet (88 mcg total) by mouth daily. 60 tablet 3   rosuvastatin (CRESTOR) 20 MG tablet Take 1 tablet (20 mg total) by mouth daily. 90 tablet 3   sertraline (ZOLOFT) 100 MG tablet Take 1 tablet (100 mg total) by mouth daily. 30 tablet 3   tirzepatide (MOUNJARO) 10 MG/0.5ML Pen Inject 10 mg into the skin once a week. 2 mL 3   No current facility-administered medications on file prior to visit.     Allergies Allergies  Allergen Reactions   Augmentin [Amoxicillin-Pot Clavulanate] Nausea And Vomiting   Erythromycin Nausea And Vomiting    Review of Systems: Constitutional:  no unexpected weight changes Eye:  no recent significant change in vision Ear/Nose/Mouth/Throat:  Ears:  no recent change in hearing Nose/Mouth/Throat:  no complaints of nasal congestion, no sore throat Cardiovascular: no chest pain Respiratory:  no shortness of breath Gastrointestinal:  no abdominal pain, no change in bowel habits GU:  Female: negative for dysuria or pelvic pain Musculoskeletal/Extremities:  no pain of the joints Integumentary (Skin/Breast):  no abnormal skin lesions reported Neurologic:  no headaches Endocrine:  denies fatigue  Exam BP 124/72 (BP Location: Left Arm, Patient Position: Sitting, Cuff Size: Normal)   Pulse (!) 57   Temp 97.7 F (36.5 C)  (Oral)   Resp 16   Ht 5\' 8"  (1.727 m)   Wt 137 lb (62.1 kg)   SpO2 100%   BMI 20.83 kg/m  General:  well developed, well nourished, in no apparent distress Skin:  no significant moles, warts, or growths Head:  no masses, lesions, or tenderness Eyes:  pupils equal and round, sclera anicteric without injection Ears:  canals without lesions, TMs shiny without retraction, no obvious effusion, no erythema Nose:  nares patent, mucosa normal, and no drainage  Throat/Pharynx:  lips and gingiva without lesion; tongue and uvula midline; non-inflamed pharynx; no exudates or postnasal drainage Neck: neck supple without adenopathy, thyromegaly, or masses Lungs:  clear to auscultation, breath sounds equal bilaterally, no respiratory distress Cardio:  regular rate and rhythm, no LE edema Abdomen:  abdomen soft, nontender; bowel sounds normal; no masses or organomegaly Genital: Defer to GYN Musculoskeletal:  symmetrical muscle groups noted without atrophy or deformity Extremities:  no clubbing, cyanosis, or edema, no deformities, no skin discoloration Neuro:  gait normal; deep tendon reflexes normal and symmetric Psych: well oriented with normal range of affect and appropriate judgment/insight  Assessment and Plan  Well adult exam - Plan: CBC, Comprehensive metabolic panel, Lipid panel  Type 2 diabetes mellitus with hyperglycemia, without long-term current use of insulin (HCC) - Plan: Hemoglobin A1c  Postsurgical hypothyroidism - Plan: TSH, T4, free   Well 52 y.o. female. Counseled on diet and exercise. Advanced directive form requested today.  Flu shot today.  Other orders as above. Follow up in 6 mo. The patient voiced understanding and agreement to the plan.  Jilda Roche [place], DO 08/28/23 10:40 AM

## 2023-08-29 ENCOUNTER — Other Ambulatory Visit: Payer: Self-pay

## 2023-08-29 DIAGNOSIS — R748 Abnormal levels of other serum enzymes: Secondary | ICD-10-CM

## 2023-09-12 ENCOUNTER — Other Ambulatory Visit: Payer: BC Managed Care – PPO

## 2023-09-15 ENCOUNTER — Other Ambulatory Visit: Payer: BC Managed Care – PPO

## 2023-09-26 ENCOUNTER — Encounter: Payer: Self-pay | Admitting: Internal Medicine

## 2023-10-05 ENCOUNTER — Other Ambulatory Visit: Payer: Self-pay | Admitting: Family Medicine

## 2023-10-05 ENCOUNTER — Encounter: Payer: Self-pay | Admitting: Family Medicine

## 2023-10-05 ENCOUNTER — Other Ambulatory Visit (HOSPITAL_BASED_OUTPATIENT_CLINIC_OR_DEPARTMENT_OTHER): Payer: Self-pay

## 2023-10-05 MED ORDER — TIRZEPATIDE 12.5 MG/0.5ML ~~LOC~~ SOAJ
12.5000 mg | SUBCUTANEOUS | 2 refills | Status: DC
Start: 2023-10-05 — End: 2024-07-29
  Filled 2023-10-05: qty 2, 28d supply, fill #0
  Filled 2023-10-06: qty 6, 84d supply, fill #0
  Filled 2023-12-24 – 2024-01-02 (×4): qty 6, 84d supply, fill #1
  Filled 2024-04-01: qty 6, 84d supply, fill #2

## 2023-10-06 ENCOUNTER — Other Ambulatory Visit (HOSPITAL_BASED_OUTPATIENT_CLINIC_OR_DEPARTMENT_OTHER): Payer: Self-pay

## 2023-10-18 ENCOUNTER — Other Ambulatory Visit (HOSPITAL_BASED_OUTPATIENT_CLINIC_OR_DEPARTMENT_OTHER): Payer: Self-pay

## 2023-11-03 ENCOUNTER — Other Ambulatory Visit (HOSPITAL_BASED_OUTPATIENT_CLINIC_OR_DEPARTMENT_OTHER): Payer: Self-pay

## 2023-11-08 DIAGNOSIS — Z1231 Encounter for screening mammogram for malignant neoplasm of breast: Secondary | ICD-10-CM | POA: Diagnosis not present

## 2023-11-08 LAB — HM MAMMOGRAPHY

## 2023-11-10 ENCOUNTER — Encounter: Payer: Self-pay | Admitting: Family Medicine

## 2023-11-12 ENCOUNTER — Other Ambulatory Visit: Payer: Self-pay | Admitting: Medical Genetics

## 2023-11-14 ENCOUNTER — Other Ambulatory Visit: Payer: Self-pay | Admitting: Internal Medicine

## 2023-11-23 ENCOUNTER — Encounter: Payer: Self-pay | Admitting: Family Medicine

## 2023-11-23 DIAGNOSIS — N6489 Other specified disorders of breast: Secondary | ICD-10-CM

## 2023-11-26 ENCOUNTER — Other Ambulatory Visit: Payer: Self-pay | Admitting: Family Medicine

## 2023-11-26 DIAGNOSIS — F411 Generalized anxiety disorder: Secondary | ICD-10-CM

## 2023-11-26 DIAGNOSIS — F339 Major depressive disorder, recurrent, unspecified: Secondary | ICD-10-CM

## 2023-11-27 ENCOUNTER — Other Ambulatory Visit (HOSPITAL_BASED_OUTPATIENT_CLINIC_OR_DEPARTMENT_OTHER): Payer: Self-pay

## 2023-11-27 MED ORDER — SERTRALINE HCL 100 MG PO TABS
100.0000 mg | ORAL_TABLET | Freq: Every day | ORAL | 3 refills | Status: AC
Start: 2023-11-27 — End: ?
  Filled 2023-11-27 – 2023-12-11 (×3): qty 30, 30d supply, fill #0
  Filled 2024-01-05 – 2024-01-09 (×2): qty 30, 30d supply, fill #1
  Filled 2024-02-12: qty 30, 30d supply, fill #2

## 2023-11-28 ENCOUNTER — Other Ambulatory Visit (HOSPITAL_BASED_OUTPATIENT_CLINIC_OR_DEPARTMENT_OTHER): Payer: Self-pay

## 2023-11-29 ENCOUNTER — Encounter: Payer: Self-pay | Admitting: Internal Medicine

## 2023-11-29 DIAGNOSIS — E892 Postprocedural hypoparathyroidism: Secondary | ICD-10-CM

## 2023-12-01 ENCOUNTER — Other Ambulatory Visit (HOSPITAL_COMMUNITY): Payer: Self-pay

## 2023-12-05 MED ORDER — LEVOTHYROXINE SODIUM 100 MCG PO TABS: 100.0000 ug | ORAL_TABLET | Freq: Every day | ORAL | 1 refills | Status: DC

## 2023-12-07 ENCOUNTER — Other Ambulatory Visit (HOSPITAL_COMMUNITY): Payer: Self-pay

## 2023-12-08 ENCOUNTER — Other Ambulatory Visit (HOSPITAL_BASED_OUTPATIENT_CLINIC_OR_DEPARTMENT_OTHER): Payer: Self-pay

## 2023-12-08 ENCOUNTER — Other Ambulatory Visit: Payer: Self-pay | Admitting: Internal Medicine

## 2023-12-11 ENCOUNTER — Other Ambulatory Visit (HOSPITAL_BASED_OUTPATIENT_CLINIC_OR_DEPARTMENT_OTHER): Payer: Self-pay

## 2023-12-19 ENCOUNTER — Other Ambulatory Visit (HOSPITAL_COMMUNITY): Payer: Self-pay

## 2023-12-25 ENCOUNTER — Other Ambulatory Visit (HOSPITAL_COMMUNITY): Payer: Self-pay

## 2023-12-25 ENCOUNTER — Other Ambulatory Visit (HOSPITAL_BASED_OUTPATIENT_CLINIC_OR_DEPARTMENT_OTHER): Payer: Self-pay

## 2023-12-25 ENCOUNTER — Telehealth: Payer: Self-pay | Admitting: Pharmacy Technician

## 2023-12-25 NOTE — Telephone Encounter (Signed)
 Pharmacy Patient Advocate Encounter   Received notification from CoverMyMeds that prior authorization for [facility] 12.5MG /0.5ML auto-injectors is required/requested.   Insurance verification completed.   The patient is insured through [employer] .   Per test claim: PA required; PA submitted to above mentioned insurance via CoverMyMeds Key/confirmation #/EOC [id number] Status is pending

## 2023-12-26 ENCOUNTER — Other Ambulatory Visit (HOSPITAL_BASED_OUTPATIENT_CLINIC_OR_DEPARTMENT_OTHER): Payer: Self-pay

## 2023-12-26 NOTE — Telephone Encounter (Signed)
 Pharmacy Patient Advocate Encounter  Received notification from [facility] that Prior Authorization for [facility] 12.5MG /0.5ML auto-injectors has been DENIED.  Full denial letter will be uploaded to the media tab. See denial reason below.     PA #/Case ID/Reference #: [id number]

## 2023-12-28 ENCOUNTER — Encounter: Payer: Self-pay | Admitting: Family Medicine

## 2023-12-28 ENCOUNTER — Other Ambulatory Visit (HOSPITAL_BASED_OUTPATIENT_CLINIC_OR_DEPARTMENT_OTHER): Payer: Self-pay

## 2023-12-28 ENCOUNTER — Telehealth: Payer: Self-pay | Admitting: Pharmacy Technician

## 2023-12-28 ENCOUNTER — Other Ambulatory Visit (HOSPITAL_COMMUNITY): Payer: Self-pay

## 2023-12-28 NOTE — Telephone Encounter (Signed)
 Most insurances want to see an A1c 6.5 or higher. The highest documented for the patient is 6.4. I found labs from 2016 where the glucose level is over 126. I will see if they will take that.  PA request has been Submitted. New Encounter has been or will be created for follow up. For additional info see Pharmacy Prior Auth telephone encounter from 12/28/23.

## 2023-12-28 NOTE — Telephone Encounter (Signed)
 Pharmacy Patient Advocate Encounter   Received notification from CoverMyMeds that prior authorization for Mounjaro 12.5MG /0.5ML auto-injectors is required/requested.   Insurance verification completed.   The patient is insured through [employer] .   Per test claim: PA required; PA submitted to above mentioned insurance via CoverMyMeds Key/confirmation #/EOC [id number] Status is pending

## 2023-12-28 NOTE — Telephone Encounter (Signed)
 Patient has diabetes, was notes sent for this patient?

## 2023-12-29 ENCOUNTER — Other Ambulatory Visit (HOSPITAL_COMMUNITY): Payer: Self-pay

## 2023-12-29 ENCOUNTER — Telehealth: Payer: Self-pay

## 2023-12-29 NOTE — Telephone Encounter (Signed)
 Pharmacy Patient Advocate Encounter  Received notification from [facility] that Prior Authorization for [facility] 12.5MG /0.5ML auto-injectors  has been APPROVED from 12/29/23 to 12/28/24. Ran test claim, Copay is $0. This test claim was processed through [facility]- copay amounts may vary at other pharmacies due to pharmacy/plan contracts, or as the patient moves through the different stages of their insurance plan.   PA #/Case ID/Reference #: [id number]

## 2023-12-29 NOTE — Telephone Encounter (Signed)
 She had labs scanned in media on 10/28/21 with a A1C of 6.5.

## 2023-12-29 NOTE — Telephone Encounter (Signed)
 Pharmacy Patient Advocate Encounter   Received notification from Pt Calls Messages that prior authorization for Mounjaro 12.5MG /0.5ML auto-injectors is required/requested.   Insurance verification completed.   The patient is insured through [employer] .   Per test claim: PA required; PA submitted to above mentioned insurance via CoverMyMeds Key/confirmation #/EOC [id number] Status is pending

## 2023-12-29 NOTE — Telephone Encounter (Signed)
 PA request has been Submitted. New Encounter has been or will be created for follow up. For additional info see Pharmacy Prior Auth telephone encounter from 12/29/23.   *PA resubmitted with lab results from media*

## 2023-12-29 NOTE — Telephone Encounter (Signed)
 PA request has been Submitted. New Encounter has been or will be created for follow up. For additional info see Pharmacy Prior Auth telephone encounter from 12/29/23.   **She resubmitted with the 2023 labs.**

## 2023-12-29 NOTE — Telephone Encounter (Signed)
 Pharmacy Patient Advocate Encounter  Received notification from [facility] that Prior Authorization for [facility] 12.5MG /0.5ML auto-injectors  has been DENIED.  Full denial letter will be uploaded to the media tab. See denial reason below.   PA #/Case ID/Reference #: [id number]

## 2024-01-02 ENCOUNTER — Other Ambulatory Visit (HOSPITAL_BASED_OUTPATIENT_CLINIC_OR_DEPARTMENT_OTHER): Payer: Self-pay

## 2024-01-05 ENCOUNTER — Other Ambulatory Visit (HOSPITAL_BASED_OUTPATIENT_CLINIC_OR_DEPARTMENT_OTHER): Payer: Self-pay

## 2024-01-09 ENCOUNTER — Other Ambulatory Visit (HOSPITAL_COMMUNITY): Payer: Self-pay

## 2024-01-12 ENCOUNTER — Other Ambulatory Visit (HOSPITAL_BASED_OUTPATIENT_CLINIC_OR_DEPARTMENT_OTHER): Payer: Self-pay

## 2024-02-14 ENCOUNTER — Encounter: Payer: Self-pay | Admitting: Family Medicine

## 2024-02-14 ENCOUNTER — Other Ambulatory Visit (HOSPITAL_BASED_OUTPATIENT_CLINIC_OR_DEPARTMENT_OTHER): Payer: Self-pay

## 2024-02-14 ENCOUNTER — Ambulatory Visit: Admitting: Family Medicine

## 2024-02-14 VITALS — BP 122/70 | HR 56 | Temp 98.0°F | Resp 16 | Ht 68.0 in | Wt 133.4 lb

## 2024-02-14 DIAGNOSIS — F411 Generalized anxiety disorder: Secondary | ICD-10-CM | POA: Diagnosis not present

## 2024-02-14 DIAGNOSIS — R5383 Other fatigue: Secondary | ICD-10-CM | POA: Diagnosis not present

## 2024-02-14 DIAGNOSIS — E1165 Type 2 diabetes mellitus with hyperglycemia: Secondary | ICD-10-CM

## 2024-02-14 DIAGNOSIS — F325 Major depressive disorder, single episode, in full remission: Secondary | ICD-10-CM

## 2024-02-14 DIAGNOSIS — E890 Postprocedural hypothyroidism: Secondary | ICD-10-CM

## 2024-02-14 DIAGNOSIS — F339 Major depressive disorder, recurrent, unspecified: Secondary | ICD-10-CM

## 2024-02-14 DIAGNOSIS — Z7985 Long-term (current) use of injectable non-insulin antidiabetic drugs: Secondary | ICD-10-CM

## 2024-02-14 LAB — COMPREHENSIVE METABOLIC PANEL WITH GFR
ALT: 15 U/L (ref 0–35)
AST: 15 U/L (ref 0–37)
Albumin: 4.8 g/dL (ref 3.5–5.2)
Alkaline Phosphatase: 51 U/L (ref 39–117)
BUN: 9 mg/dL (ref 6–23)
CO2: 29 meq/L (ref 19–32)
Calcium: 8.4 mg/dL (ref 8.4–10.5)
Chloride: 104 meq/L (ref 96–112)
Creatinine, Ser: 0.8 mg/dL (ref 0.40–1.20)
GFR: 83.06 mL/min (ref >60.00)
Glucose, Bld: 77 mg/dL (ref 70–99)
Potassium: 4.1 meq/L (ref 3.5–5.1)
Sodium: 140 meq/L (ref 135–145)
Total Bilirubin: 1.1 mg/dL (ref 0.2–1.2)
Total Protein: 6.7 g/dL (ref 6.0–8.3)

## 2024-02-14 LAB — LIPID PANEL
Cholesterol: 150 mg/dL (ref 0–200)
HDL: 71.1 mg/dL (ref >39.00)
LDL Cholesterol: 57 mg/dL (ref 0–99)
NonHDL: 79.33
Total CHOL/HDL Ratio: 2
Triglycerides: 114 mg/dL (ref 0.0–149.0)
VLDL: 22.8 mg/dL (ref 0.0–40.0)

## 2024-02-14 LAB — MICROALBUMIN / CREATININE URINE RATIO
Creatinine,U: 36.1 mg/dL
Microalb Creat Ratio: UNDETERMINED mg/g (ref 0.0–30.0)
Microalb, Ur: <0.7 mg/dL

## 2024-02-14 LAB — CBC
HCT: 40.7 % (ref 36.0–46.0)
Hemoglobin: 13.7 g/dL (ref 12.0–15.0)
MCHC: 33.7 g/dL (ref 30.0–36.0)
MCV: 87.4 fl (ref 78.0–100.0)
Platelets: 228 10*3/uL (ref 150.0–400.0)
RBC: 4.66 Mil/uL (ref 3.87–5.11)
RDW: 12.6 % (ref 11.5–15.5)
WBC: 6.2 10*3/uL (ref 4.0–10.5)

## 2024-02-14 LAB — IBC + FERRITIN
Ferritin: 166.4 ng/mL (ref 10.0–291.0)
Iron: 76 ug/dL (ref 42–145)
Saturation Ratios: 24.7 % (ref 20.0–50.0)
TIBC: 308 ug/dL (ref 250.0–450.0)
Transferrin: 220 mg/dL (ref 212.0–360.0)

## 2024-02-14 LAB — HEMOGLOBIN A1C: Hgb A1c MFr Bld: 5 % (ref 4.6–6.5)

## 2024-02-14 LAB — TSH: TSH: 0.75 u[IU]/mL (ref 0.35–5.50)

## 2024-02-14 LAB — T4, FREE: Free T4: 0.91 ng/dL (ref 0.60–1.60)

## 2024-02-14 MED ORDER — SERTRALINE HCL 100 MG PO TABS: 150.0000 mg | ORAL_TABLET | Freq: Every day | ORAL | 1 refills | Status: DC

## 2024-02-14 NOTE — Patient Instructions (Addendum)
 Give us  2-3 business days to get the results of your labs back.   Keep the diet clean and stay active.  Aim to do some physical exertion for 150 minutes per week. This is typically divided into 5 days per week, 30 minutes per day. The activity should be enough to get your heart rate up. Anything is better than nothing if you have time constraints.  Please consider counseling. Contact [phone] to schedule an appointment or inquire about cost/insurance coverage.  [facility] located at [address], [place], [place].  Phone number = [phone].  Dr. Dewight Fore - Adult Psychiatry.    [facility] located at [address] [place], [place], [place]. Phone number = [phone].   [facility] located at [address] [place], [place].  Phone number = [phone].   [facility] located at [address] [place], [place], [place].  Phone number = [phone].  Let us  know if you need anything.

## 2024-02-14 NOTE — Progress Notes (Signed)
 Subjective:   Chief Complaint  Patient presents with   Follow-up    Follow Up 6 Months    Pamela [name] is a 53 y.o. female here for follow-up of diabetes.   [name] does not routinely check her sugars.  Patient does not require insulin.   Medications include: Mounjaro  12.5 mg/week Diet is usually healthy.  Exercise: active with kids No CP or SOB.  GAD/depression- Taking Zoloft  100 mg/d. Compliant, no AE's. Mood is doing well overall- has been tired and mood not as up as it had been over the past 4-6 hrs. No HI or SI. No self medication. She is not following with a counselor/psychologist.   Past Medical History:  Diagnosis Date   Allergy    seasonal allergies   Arthritis    osteoarthritis in bilateral hands   Complication of anesthesia    trouble waking up after second myomectomy   Depression    hx of situational depression   Dermatitis    Diabetes mellitus without complication (HCC)    Dysrhythmia    resolved- "grew out of it" - no problem since teenager   Genital warts    removed   GERD (gastroesophageal reflux disease)    hx-bacterial ulcer in 2004   Gestational diabetes    Hypertension 2016   PIH with 2016 pregnancy / no BP meds since birth of child   Lichen sclerosus    Newborn product of in vitro fertilization (IVF) pregnancy    Pregnancy induced hypertension    Termination of pregnancy (fetus)    x 1   Thyroid  cancer (HCC)    thyroid  CA in 2016-sx     Related testing: Retinal exam: Done Pneumovax: done  Objective:  BP 122/70 (BP Location: Left Arm, Patient Position: Sitting)   Pulse (!) 56   Temp 98 F (36.7 C) (Oral)   Resp 16   Ht 5\' 8"  (1.727 m)   Wt 133 lb 6.4 oz (60.5 kg)   SpO2 98%   BMI 20.28 kg/m  General:  Well developed, well nourished, in no apparent distress Lungs:  CTAB, no access msc use Cardio:  Reg rhythm, bradycardic, no bruits, no LE edema Psych: Age appropriate judgment and insight  Assessment:   Type 2 diabetes  mellitus with hyperglycemia, without long-term current use of insulin (HCC) - Plan: Comprehensive metabolic panel with GFR, Hemoglobin A1c, Lipid panel, Microalbumin / creatinine urine ratio  GAD (generalized anxiety disorder)  Depression, major, single episode, complete remission (HCC)   Plan:   Chronic, stable. Cont Mounjaro  12.5 mg/week.  Counseled on diet and exercise. 2/3. Chronic, unstable. Needs more sleep. Counseling info provided. Increase Zoloft  from 100 mg/d to 150 mg/d. F/u in 6 weeks. The patient voiced understanding and agreement to the plan.  Pamela [name] [place], DO 02/14/24 9:23 AM

## 2024-02-18 ENCOUNTER — Encounter: Payer: Self-pay | Admitting: Internal Medicine

## 2024-02-19 ENCOUNTER — Other Ambulatory Visit: Payer: Self-pay | Admitting: Internal Medicine

## 2024-02-19 MED ORDER — LEVOTHYROXINE SODIUM 100 MCG PO TABS: 100.0000 ug | ORAL_TABLET | Freq: Every day | ORAL | 0 refills | Status: DC

## 2024-02-27 ENCOUNTER — Ambulatory Visit: Payer: BC Managed Care – PPO | Admitting: Family Medicine

## 2024-03-05 ENCOUNTER — Other Ambulatory Visit (HOSPITAL_COMMUNITY)

## 2024-03-26 ENCOUNTER — Ambulatory Visit: Payer: BC Managed Care – PPO | Admitting: Internal Medicine

## 2024-04-01 ENCOUNTER — Other Ambulatory Visit (HOSPITAL_BASED_OUTPATIENT_CLINIC_OR_DEPARTMENT_OTHER): Payer: Self-pay

## 2024-04-01 ENCOUNTER — Other Ambulatory Visit: Payer: Self-pay | Admitting: Family Medicine

## 2024-04-01 ENCOUNTER — Other Ambulatory Visit: Payer: Self-pay

## 2024-04-01 DIAGNOSIS — E1165 Type 2 diabetes mellitus with hyperglycemia: Secondary | ICD-10-CM

## 2024-04-01 MED ORDER — ROSUVASTATIN CALCIUM 20 MG PO TABS
20.0000 mg | ORAL_TABLET | Freq: Every day | ORAL | 1 refills | Status: DC
  Filled 2024-04-01: qty 90, 90d supply, fill #0
  Filled 2024-07-08: qty 90, 90d supply, fill #1

## 2024-05-09 ENCOUNTER — Other Ambulatory Visit: Payer: Self-pay | Admitting: Internal Medicine

## 2024-05-24 ENCOUNTER — Ambulatory Visit: Admitting: Internal Medicine

## 2024-06-07 ENCOUNTER — Ambulatory Visit: Admitting: Internal Medicine

## 2024-06-11 ENCOUNTER — Ambulatory Visit: Admitting: Internal Medicine

## 2024-06-11 ENCOUNTER — Encounter: Payer: Self-pay | Admitting: Internal Medicine

## 2024-06-11 VITALS — BP 122/68 | HR 66 | Ht 68.0 in | Wt 133.4 lb

## 2024-06-11 DIAGNOSIS — E559 Vitamin D deficiency, unspecified: Secondary | ICD-10-CM | POA: Diagnosis not present

## 2024-06-11 DIAGNOSIS — E890 Postprocedural hypothyroidism: Secondary | ICD-10-CM

## 2024-06-11 DIAGNOSIS — E892 Postprocedural hypoparathyroidism: Secondary | ICD-10-CM | POA: Diagnosis not present

## 2024-06-11 DIAGNOSIS — C73 Malignant neoplasm of thyroid gland: Secondary | ICD-10-CM

## 2024-06-11 NOTE — Progress Notes (Signed)
 Patient ID: Pamela [name], female   DOB: 1971-03-08, 53 y.o.   MRN: [id number]  HPI  Pamela [name] is a 53 y.o.-year-old female, initially referred by Dr. Lavoie, presenting for follow-up for h/o papillary thyroid  cancer,postsurgical hypothyroidism and also postsurgical hypoparathyroidism. Last visit 1 year ago.  Interim history: She continues on Mounjaro , which helps, reducing cravings and causes significant weight loss.  She lost ~90 pounds in the last 3 years.  She plateaued in the last approximately 1 year.  Reviewed her PTC cancer history: She had an exam with ObGyn on 12/15/2014 >> Dr. Lavoie felt a lump in neck.  Thyroid  U/S (02/23/2015): Hypoechoic, solid right-sided isthmus nodule measures approximately 1.4 x 0.6 x 1.0 cm. There are a few scattered areas of peripheral calcification in this nodule.    FNA (02/25/2015): FINDINGS CONSISTENT WITH PAPILLARY CARCINOMA (BETHESDA CATEGORY VI).  Thyroidectomy (03/12/2015): 1. Thyroid , thyroidectomy, total thyroid  sutures right lobe - MULTIFOCAL PAPILLARY THYROID  CARCINOMA, TWO FOCI MEASURING 1.1 CM AND 0.6 CM IN GREATEST DIMENSION. - MARGINS ARE NEGATIVE. - ONE BENIGN LYMPH NODE WITH NO TUMOR SEEN (0/1). - SEE ONCOLOGY TEMPLATE. ADDITIONAL FINDINGS: - INCIDENTAL BENIGN PARATHYROID TISSUE (0.3 CM). 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 2. Lymph nodes, regional resection, central compartment - THREE BENIGN LYMPH NODES WITH NO TUMOR SEEN (0/3). - INCIDENTAL BENIGN THYMIC TISSUE.  1. THYROID  Specimen: Total thyroid  with central compartment lymph nodes. Procedure: Total thyroidectomy with central compartment lymph node dissection. Specimen Integrity (intact/fragmented): Intact. Tumor focality: Multifocal, two foci.  Dominant tumor: Maximum tumor size (cm): 1.1 cm Tumor laterality: Isthmus (central). Histologic type (including subtype and/or unique features as applicable): Papillary thyroid  carcinoma, conventional type. Tumor  capsule: No tumor capsule identified. Extrathyroidal extension: No. Margins: Negative. Lymph - Vascular invasion: Not identified. Capsular invasion with degree of invasion if present: Not applicable.  Second tumor: Tumor size(s): 0.6 cm Tumor laterality: Right superior. Histologic type (including subtype and/or unique features as applicable): Papillary thyroid  carcinoma, conventional type. Tumor capsule: No tumor capsule identified. Extrathyroidal extension: No. Margins: Negative. Lymph - Vascular invasion: Not identified. Capsular invasion with degree of invasion if present: Not applicable. Lymph nodes: # examined 4; # positive; 0.  She did not have RAI treatment due to small sizes of the tumors.  04/11/2016: Neck U/S:  No evidence of tissue in the right or left thyroid  beds to suggest recurrence of residual thyroid  tissue.  No evidence of abnormal adenopathy.  04/06/2020: Neck U/S:  No abnormal masses in the neck, no signs of cancer recurrence.  Her thyroglobulin's are low and her thyroglobulin antibodies are not elevated: Lab Results  Component Value Date   THYROGLB 0.2 (L) 03/27/2023   THYROGLB 0.1 (L) 03/15/2022   THYROGLB 0.3 (L) 03/11/2021   THYROGLB 0.2 (L) 03/10/2020   THYROGLB 0.2 (L) 02/14/2018   THYROGLB 0.2 (L) 02/14/2017   THYROGLB 0.1 (L) 03/17/2016   THYROGLB 0.2 (L) 09/18/2015   Lab Results  Component Value Date   THGAB <1 03/27/2023   THGAB <1 03/15/2022   THGAB <1 03/11/2021   THGAB <1 03/10/2020   THGAB <1 02/14/2018   THGAB <1 02/14/2017   THGAB <1 03/17/2016   THGAB <1 09/18/2015   Pt denies: - feeling nodules in neck - hoarseness - dysphagia - choking  Reviewed her TFTs: Lab Results  Component Value Date   TSH 0.75 02/14/2024   TSH 2.34 08/28/2023   TSH 0.18 (L) 03/27/2023   TSH 0.05 (L) 08/22/2022   TSH 0.07 (L) 03/15/2022  TSH 0.281 (L) 12/30/2021   TSH 7.04 (H) 03/11/2021   FREET4 0.91 02/14/2024   FREET4 1.09 08/28/2023    FREET4 1.11 03/27/2023   FREET4 1.29 08/22/2022   FREET4 1.62 (H) 03/15/2022   FREET4 1.84 (H) 12/30/2021   FREET4 1.09 03/11/2021  10/28/2021: TSH 1.01 Received labs from Dr. Gorge, from 08/30/2017: - TSH 0.58, free T4 1.42, both at goal  Pt is on levothyroxine  (Synthroid ) 100 mcg daily: - in am - fasting - at least 30 min from b'fast - + Ca at night, no Fe, + MVI at night, no PPIs - not on Biotin  She  also has a history of HTN during previous pregnancy >> son born 10/2014.  She had IVF in 2018 >> healthy baby girl born 11/2017.   We started: -Calcium  500 mg with dinner >> then stopped, only take it as needed, 1-2 times a month >> daily - but missing many doses -Vitamin D  5000 units daily >> then 2500 units daily >> 5000 units daily  Reviewed pertinent labs: Lab Results  Component Value Date   PTH 14 (L) 03/10/2020   PTH Comment 03/10/2020   PTH 13 (L) 02/14/2018   PTH Comment 02/14/2018   CALCIUM  8.4 02/14/2024   CALCIUM  8.4 08/28/2023   CALCIUM  8.0 (L) 02/20/2023   CALCIUM  8.5 08/22/2022   CALCIUM  9.0 02/16/2022   CALCIUM  7.5 (L) 03/11/2021   CALCIUM  7.7 (L) 03/18/2020   CALCIUM  8.0 (L) 03/10/2020   CALCIUM  8.9 03/29/2018   CALCIUM  7.9 (L) 02/14/2018   Lab Results  Component Value Date   VD25OH 45.04 03/27/2023   VD25OH 31.8 03/11/2021   VD25OH 44.63 03/10/2020   VD25OH 46.4 03/29/2018   VD25OH 19.27 (L) 02/14/2018   She mentions that she has a history of gestational diabetes with one of her pregnancies.  Most recent HbA1c was: Lab Results  Component Value Date   HGBA1C 5.0 02/14/2024   HGBA1C 5.3 08/28/2023   HGBA1C 5.3 02/20/2023   HGBA1C 5.4 08/22/2022   HGBA1C 5.6 02/16/2022   HGBA1C 6.4 03/11/2021   HGBA1C 6.4 03/18/2020  11/02/2021: HbA1c 6.5%  She has a CGM. She is on Mounjaro  per PCP.  ROS: + see HPI  I reviewed pt's medications, allergies, PMH, social hx, family hx, and changes were documented in the history of present illness.  Otherwise, unchanged from my initial visit note.  Past Medical History:  Diagnosis Date   Allergy    seasonal allergies   Arthritis    osteoarthritis in bilateral hands   Complication of anesthesia    trouble waking up after second myomectomy   Depression    hx of situational depression   Dermatitis    Diabetes mellitus without complication (HCC)    Dysrhythmia    resolved- grew out of it - no problem since teenager   Genital warts    removed   GERD (gastroesophageal reflux disease)    hx-bacterial ulcer in 2004   Gestational diabetes    Hypertension 2016   PIH with 2016 pregnancy / no BP meds since birth of child   Lichen sclerosus    Newborn product of in vitro fertilization (IVF) pregnancy    Pregnancy induced hypertension    Termination of pregnancy (fetus)    x 1   Thyroid  cancer (HCC)    thyroid  CA in 2016-sx   Past Surgical History:  Procedure Laterality Date   BLADDER SURGERY  1992   Widened the urethra   CESAREAN SECTION N/A  11/04/2014   Procedure: CESAREAN SECTION;  Surgeon: Marie-Lyne Lavoie, MD;  Location: [facility];  Service: Obstetrics;  Laterality: N/A;  EDD: 11/23/14   CESAREAN SECTION N/A 11/24/2017   Procedure: Repeat CESAREAN SECTION;  Surgeon: Gorge Ade, MD;  Location: [facility];  Service: Obstetrics;  Laterality: N/A;  EDD: 12/14/17 Allergy: Erythromycin , Augmentin   LYMPH NODE DISSECTION  03/12/2015   Procedure: LYMPH NODE DISSECTION;  Surgeon: Krystal Spinner, MD;  Location: [facility];  Service: General;;   myomectomy  2008   MYOMECTOMY  2014   Removed tubes bilaterally   MYOMECTOMY N/A 11/04/2014   Procedure: MYOMECTOMY;  Surgeon: Percilla Burly, MD;  Location: [facility];  Service: Obstetrics;  Laterality: N/A;   THYROIDECTOMY N/A 03/12/2015   Procedure: TOTAL THYROIDECTOMY;  Surgeon: Krystal Spinner, MD;  Location: [facility];  Service: General;  Laterality: N/A;   TUBAL LIGATION     WISDOM TOOTH EXTRACTION     History   Social History   Marital  Status: Married    Spouse Name: N/A   Number of Children: 1   Occupational History    accountant    Social History Main Topics   Smoking status: Former Smoker -- 1.00 packs/day for 20 years    Types: Cigarettes    Quit date: 08/11/2007   Smokeless tobacco: Never Used   Alcohol Use: Yes     Comment: Beer/liquor 1 drink once a week    Drug Use: Yes    Special: Marijuana, Cocaine     Comment: 25 years ago Acid  , last use cocaine/marijuana 25 yrs ago   Current Outpatient Medications on File Prior to Visit  Medication Sig Dispense Refill   clobetasol ointment (TEMOVATE) 0.05 % Apply 1 application topically 2 (two) times daily.     levothyroxine  (SYNTHROID ) 100 MCG tablet TAKE ONE TABLET BY MOUTH ONE TIME DAILY BEFORE BREAKFAST 90 tablet 0   rosuvastatin  (CRESTOR ) 20 MG tablet Take 1 tablet (20 mg total) by mouth daily. 90 tablet 1   sertraline  (ZOLOFT ) 100 MG tablet Take 1.5 tablets (150 mg total) by mouth daily. 135 tablet 1   tirzepatide  (MOUNJARO ) 12.5 MG/0.5ML Pen Inject 12.5 mg into the skin once a week. 6 mL 2   No current facility-administered medications on file prior to visit.   Allergies  Allergen Reactions   Augmentin [Amoxicillin-Pot Clavulanate] Nausea And Vomiting   Erythromycin  Nausea And Vomiting   No family history available. Patient is adopted.  She has lichen sclerosus >> on Clobetasol.  PE: BP 122/68   Pulse 66   Ht 5' 8 (1.727 m)   Wt 133 lb 6.4 oz (60.5 kg)   SpO2 98%   BMI 20.28 kg/m    Wt Readings from Last 20 Encounters:  06/11/24 133 lb 6.4 oz (60.5 kg)  02/14/24 133 lb 6.4 oz (60.5 kg)  08/28/23 137 lb (62.1 kg)  03/27/23 135 lb 3.2 oz (61.3 kg)  02/20/23 136 lb 4 oz (61.8 kg)  01/20/23 136 lb 4 oz (61.8 kg)  08/22/22 144 lb 2 oz (65.4 kg)  03/21/22 176 lb 4 oz (79.9 kg)  03/15/22 179 lb (81.2 kg)  02/16/22 189 lb 2 oz (85.8 kg)  12/03/21 208 lb 6 oz (94.5 kg)  11/19/21 212 lb (96.2 kg)  10/08/21 213 lb 6 oz (96.8 kg)  03/11/21 218  lb 9.6 oz (99.2 kg)  05/13/20 212 lb (96.2 kg)  04/29/20 212 lb (96.2 kg)  03/18/20 215 lb (97.5 kg)  03/10/20  217 lb (98.4 kg)  02/14/18 195 lb 9.6 oz (88.7 kg)  11/24/17 204 lb (92.5 kg)   Constitutional: normal weight, in NAD Eyes: EOMI, no exophthalmos ENT: no neck masses palpated, no cervical lymphadenopathy Cardiovascular: RRR, No MRG Respiratory: CTA B Musculoskeletal: no deformities Skin: no rashes Neurological: no tremor with outstretched hands  ASSESSMENT: 1. Bifocal papillary thyroid  cancer  2. Postsurgical hypothyroidism  3.  Postsurgical hypoparathyroidism  4.  Vitamin D  deficiency  PLAN: 1. Papillary thyroid  cancer - Reviewed her papillary thyroid  cancer features Not encapsulated Multifocal, but the second focus was on micro PTC No extension in the lymph or blood vessels  Margin is free of disease Latest neck ultrasound from 2017 showed no recurrence or metastasis in the neck Overall, she is stage I PTC, which has a very good prognosis >> RAI treatment was necessary - The neck ultrasound from 03/2020 showed no suspicious masses in the neck, no evidence of recurrence - Her thyroglobulin level was detectable but low, at last check, and ATA antibodies were also undetectable.  These results are expected since she did not have RAI treatment. - We will repeat this today - At last visit we checked a neck ultrasound and this did not show any suspicious masses.  We can repeat another ultrasound in 3 to 5 years. -I will see her back in 1 year  2. Postsurgical hypothyroidism - latest thyroid  labs reviewed with pt. >> normal: Lab Results  Component Value Date   TSH 0.75 02/14/2024  - she continues on LT4 100 mcg daily - pt feels good on this dose. - we discussed about taking the thyroid  hormone every day, with water, >30 minutes before breakfast, separated by >4 hours from acid reflux medications, calcium , iron , multivitamins. Pt. is taking it correctly. - will check  thyroid  tests today: TSH and fT4 - If labs are abnormal, she will need to return for repeat TFTs in 1.5 months - OTW, I will see her back in a year  3.  Postsurgical hypoparathyroidism and 4. vitamin D  deficiency - Her PTH was low after surgery, at 14 -We started calcium  500 g daily with dinner and continued 5000 units vitamin D  daily. - Vitamin D  level was normal at last visit, at 45 - Ionized calcium  was slightly low, at 4.5 (4.7-5.5) but she was missing doses due to the large calcium  tablet size.  I advised her to take calcium  daily and switch to a gummy.  However, she mentions that she is still missing many doses. - Latest total calcium  was normal 02/2024 - she denies muscle cramping or tingling. - At today's visit we will recheck her vitamin D .  Needs refills - generic LT4.  Orders Placed This Encounter  Procedures   TSH   T4, free   VITAMIN D  25 Hydroxy (Vit-D Deficiency, Fractures)   Thyroglobulin Level   Thyroglobulin antibody   Lela Fendt, MD PhD [facility]

## 2024-06-11 NOTE — Patient Instructions (Signed)
Please continue: - vitamin D 5000 units daily - calcium 500 mg daily with dinner   Please continue levothyroxine 100 mcg daily.  Take the thyroid hormone every day, with water, at least 30 minutes before breakfast, separated by at least 4 hours from: - acid reflux medications - calcium - iron - multivitamins  Please stop at the lab.  Please come back for a follow-up appointment in 1 year.

## 2024-06-13 LAB — THYROGLOBULIN LEVEL: Thyroglobulin: 0.2 ng/mL — ABNORMAL LOW

## 2024-06-13 LAB — T4, FREE: Free T4: 1.5 ng/dL (ref 0.8–1.8)

## 2024-06-13 LAB — TSH: TSH: 0.21 m[IU]/L — ABNORMAL LOW

## 2024-06-13 LAB — VITAMIN D 25 HYDROXY (VIT D DEFICIENCY, FRACTURES): Vit D, 25-Hydroxy: 36 ng/mL (ref 30–100)

## 2024-06-13 LAB — THYROGLOBULIN ANTIBODY: Thyroglobulin Ab: <1 [IU]/mL (ref <=1)

## 2024-06-14 ENCOUNTER — Ambulatory Visit: Payer: Self-pay | Admitting: Internal Medicine

## 2024-06-14 MED ORDER — LEVOTHYROXINE SODIUM 88 MCG PO TABS: ORAL_TABLET | ORAL | 3 refills | Status: AC

## 2024-06-14 MED ORDER — LEVOTHYROXINE SODIUM 100 MCG PO TABS: ORAL_TABLET | ORAL | 3 refills | Status: AC

## 2024-06-14 NOTE — Addendum Note (Signed)
 Addended by: TRIXIE FILE on: 06/14/2024 02:21 PM   Modules accepted: Orders

## 2024-07-29 ENCOUNTER — Encounter: Payer: Self-pay | Admitting: Family Medicine

## 2024-07-29 ENCOUNTER — Other Ambulatory Visit (HOSPITAL_BASED_OUTPATIENT_CLINIC_OR_DEPARTMENT_OTHER): Payer: Self-pay

## 2024-07-29 ENCOUNTER — Ambulatory Visit: Admitting: Family Medicine

## 2024-07-29 VITALS — BP 124/70 | HR 72 | Temp 98.0°F | Resp 16 | Ht 68.0 in | Wt 134.8 lb

## 2024-07-29 DIAGNOSIS — N3001 Acute cystitis with hematuria: Secondary | ICD-10-CM | POA: Diagnosis not present

## 2024-07-29 LAB — POC URINALSYSI DIPSTICK (AUTOMATED)
Glucose, UA: POSITIVE — AB
Ketones, UA: NEGATIVE
Protein, UA: NEGATIVE
Spec Grav, UA: 1.01 (ref 1.010–1.025)
Urobilinogen, UA: 0.2 U/dL
pH, UA: 6 (ref 5.0–8.0)

## 2024-07-29 MED ORDER — TIRZEPATIDE 10 MG/0.5ML ~~LOC~~ SOAJ
10.0000 mg | SUBCUTANEOUS | 1 refills | Status: AC
  Filled 2024-07-29: qty 6, 84d supply, fill #0
  Filled 2024-10-16: qty 6, 84d supply, fill #1

## 2024-07-29 MED ORDER — SULFAMETHOXAZOLE-TRIMETHOPRIM 800-160 MG PO TABS
1.0000 | ORAL_TABLET | Freq: Two times a day (BID) | ORAL | 0 refills | Status: AC
  Filled 2024-07-29: qty 14, 7d supply, fill #0

## 2024-07-29 NOTE — Progress Notes (Signed)
 Chief Complaint  Patient presents with   Dysuria    Dysuria    DUANA Pamela [name] is a 53 y.o. female here for possible UTI.  Duration: 3 days. Symptoms: Dysuria, urinary frequency, hematuria, urinary hesitancy, urinary retention, lower abd pain, and urgency Denies: fever, flank pain, nausea, and vomiting, vaginal discharge Hx of recurrent UTI? No 5 d of Macrobid helped for a few d, got worse.  Denies new sexual partners.  Past Medical History:  Diagnosis Date   Allergy    seasonal allergies   Arthritis    osteoarthritis in bilateral hands   Complication of anesthesia    trouble waking up after second myomectomy   Depression    hx of situational depression   Dermatitis    Diabetes mellitus without complication (HCC)    Dysrhythmia    resolved- grew out of it - no problem since teenager   Genital warts    removed   GERD (gastroesophageal reflux disease)    hx-bacterial ulcer in 2004   Gestational diabetes    Hypertension 2016   PIH with 2016 pregnancy / no BP meds since birth of child   Lichen sclerosus    Newborn product of in vitro fertilization (IVF) pregnancy    Pregnancy induced hypertension    Termination of pregnancy (fetus)    x 1   Thyroid  cancer (HCC)    thyroid  CA in 2016-sx     BP 124/70 (BP Location: Left Arm, Patient Position: Sitting)   Pulse 72   Temp 98 F (36.7 C) (Oral)   Resp 16   Ht 5' 8 (1.727 m)   Wt 134 lb 12.8 oz (61.1 kg)   SpO2 98%   BMI 20.50 kg/m  General: Awake, alert, appears stated age Heart: RRR Lungs: CTAB, normal respiratory effort, no accessory muscle usage Abd: BS+, soft, TTP in suprapubic region, ND, no masses or organomegaly MSK: No CVA tenderness, neg Lloyd's sign Psych: Age appropriate judgment and insight  Acute cystitis with hematuria - Plan: POCT Urinalysis Dipstick (Automated), Urine Culture, sulfamethoxazole-trimethoprim (BACTRIM DS) 800-160 MG tablet  Ck cx. Failed Macrobid. 7 d of Bactrim.  Stay  hydrated. Seek immediate care if pt starts to develop fevers, new/worsening symptoms, uncontrollable N/V. F/u prn. The patient voiced understanding and agreement to the plan.  Mabel [place], DO 07/29/24 8:59 AM

## 2024-07-29 NOTE — Patient Instructions (Signed)
 Stay hydrated.   Warning signs/symptoms: Uncontrollable nausea/vomiting, fevers, worsening symptoms despite treatment, confusion.  Give [place] around 2 business days to get culture back to you.  Let us know if you need anything.

## 2024-07-31 ENCOUNTER — Ambulatory Visit: Payer: Self-pay | Admitting: Family Medicine

## 2024-07-31 LAB — URINE CULTURE
MICRO NUMBER:: 17121052
Result:: NO GROWTH
SPECIMEN QUALITY:: ADEQUATE

## 2024-08-06 ENCOUNTER — Other Ambulatory Visit: Payer: Self-pay | Admitting: Family Medicine

## 2024-08-06 DIAGNOSIS — F339 Major depressive disorder, recurrent, unspecified: Secondary | ICD-10-CM

## 2024-08-06 DIAGNOSIS — F411 Generalized anxiety disorder: Secondary | ICD-10-CM

## 2024-08-12 ENCOUNTER — Other Ambulatory Visit: Payer: Self-pay | Admitting: Medical Genetics

## 2024-08-12 DIAGNOSIS — Z006 Encounter for examination for normal comparison and control in clinical research program: Secondary | ICD-10-CM

## 2024-08-13 ENCOUNTER — Ambulatory Visit: Admitting: Family Medicine

## 2024-08-16 ENCOUNTER — Ambulatory Visit: Admitting: Family Medicine

## 2024-08-20 ENCOUNTER — Encounter: Payer: Self-pay | Admitting: Family Medicine

## 2024-08-20 ENCOUNTER — Ambulatory Visit: Admitting: Family Medicine

## 2024-08-20 ENCOUNTER — Other Ambulatory Visit: Payer: Self-pay

## 2024-08-20 ENCOUNTER — Ambulatory Visit: Payer: Self-pay | Admitting: Family Medicine

## 2024-08-20 VITALS — BP 122/70 | HR 55 | Temp 97.9°F | Resp 16 | Ht 68.0 in | Wt 137.8 lb

## 2024-08-20 DIAGNOSIS — Z23 Encounter for immunization: Secondary | ICD-10-CM | POA: Diagnosis not present

## 2024-08-20 DIAGNOSIS — Z7985 Long-term (current) use of injectable non-insulin antidiabetic drugs: Secondary | ICD-10-CM

## 2024-08-20 DIAGNOSIS — F339 Major depressive disorder, recurrent, unspecified: Secondary | ICD-10-CM

## 2024-08-20 DIAGNOSIS — E890 Postprocedural hypothyroidism: Secondary | ICD-10-CM

## 2024-08-20 DIAGNOSIS — E1165 Type 2 diabetes mellitus with hyperglycemia: Secondary | ICD-10-CM | POA: Diagnosis not present

## 2024-08-20 DIAGNOSIS — F411 Generalized anxiety disorder: Secondary | ICD-10-CM | POA: Diagnosis not present

## 2024-08-20 LAB — COMPREHENSIVE METABOLIC PANEL WITH GFR
ALT: 23 U/L (ref 0–35)
AST: 19 U/L (ref 0–37)
Albumin: 4.9 g/dL (ref 3.5–5.2)
Alkaline Phosphatase: 53 U/L (ref 39–117)
BUN: 10 mg/dL (ref 6–23)
CO2: 30 meq/L (ref 19–32)
Calcium: 8.3 mg/dL — ABNORMAL LOW (ref 8.4–10.5)
Chloride: 100 meq/L (ref 96–112)
Creatinine, Ser: 0.9 mg/dL (ref 0.40–1.20)
GFR: 71.85 mL/min (ref >60.00)
Glucose, Bld: 68 mg/dL — ABNORMAL LOW (ref 70–99)
Potassium: 3.9 meq/L (ref 3.5–5.1)
Sodium: 140 meq/L (ref 135–145)
Total Bilirubin: 1.4 mg/dL — ABNORMAL HIGH (ref 0.2–1.2)
Total Protein: 6.9 g/dL (ref 6.0–8.3)

## 2024-08-20 LAB — HEMOGLOBIN A1C: Hgb A1c MFr Bld: 4.8 % (ref 4.6–6.5)

## 2024-08-20 LAB — T4, FREE: Free T4: 0.92 ng/dL (ref 0.60–1.60)

## 2024-08-20 LAB — LIPID PANEL
Cholesterol: 144 mg/dL (ref 0–200)
HDL: 84.2 mg/dL (ref >39.00)
LDL Cholesterol: 46 mg/dL (ref 0–99)
NonHDL: 59.82
Total CHOL/HDL Ratio: 2
Triglycerides: 70 mg/dL (ref 0.0–149.0)
VLDL: 14 mg/dL (ref 0.0–40.0)

## 2024-08-20 LAB — TSH: TSH: 1.54 u[IU]/mL (ref 0.35–5.50)

## 2024-08-20 MED ORDER — QUETIAPINE FUMARATE 25 MG PO TABS: 25.0000 mg | ORAL_TABLET | Freq: Every day | ORAL | 0 refills | Status: AC

## 2024-08-20 NOTE — Progress Notes (Signed)
 Subjective:   Chief Complaint  Patient presents with   Medication Refill    Medication Check    Pamela [name] is a 53 y.o. female here for follow-up of diabetes.   Elandra does not routinely monitor her sugars.  Patient does not require insulin.   Medications include: Mounjaro  10 mg/week Diet is healthy.  Exercise: active with work No CP or SOB.  Depression Taking Zoloft  150 mg/d. Compliant, no AE's. Having some anxiety and insomnia. Mood is controlled. No SI or HI. No self-medication. She is not seeing   Past Medical History:  Diagnosis Date   Allergy    seasonal allergies   Arthritis    osteoarthritis in bilateral hands   Complication of anesthesia    trouble waking up after second myomectomy   Depression    hx of situational depression   Dermatitis    Diabetes mellitus without complication (HCC)    Dysrhythmia    resolved- grew out of it - no problem since teenager   Genital warts    removed   GERD (gastroesophageal reflux disease)    hx-bacterial ulcer in 2004   Gestational diabetes    Hypertension 2016   PIH with 2016 pregnancy / no BP meds since birth of child   Lichen sclerosus    Newborn product of in vitro fertilization (IVF) pregnancy    Pregnancy induced hypertension    Termination of pregnancy (fetus)    x 1   Thyroid  cancer (HCC)    thyroid  CA in 2016-sx     Related testing: Retinal exam: Done Pneumovax: done  Objective:  BP 122/70 (BP Location: Left Arm, Patient Position: Sitting)   Pulse (!) 55   Temp 97.9 F (36.6 C) (Oral)   Resp 16   Ht 5' 8 (1.727 m)   Wt 137 lb 12.8 oz (62.5 kg)   SpO2 95%   BMI 20.95 kg/m  General:  Well developed, well nourished, in no apparent distress Skin:  Warm, no pallor or diaphoresis Head:  Normocephalic, atraumatic Eyes:  Pupils equal and round, sclera anicteric without injection  Lungs:  CTAB, no access msc use Cardio:  RRR, no bruits, no LE edema Musculoskeletal:  Symmetrical muscle groups  noted without atrophy or deformity Neuro:  Sensation intact to pinprick on feet Psych: Age appropriate judgment and insight  Assessment:   Type 2 diabetes mellitus with hyperglycemia, without long-term current use of insulin (HCC) - Plan: Comprehensive metabolic panel with GFR, Lipid panel, Hemoglobin A1c  Depression, recurrent - Plan: QUEtiapine (SEROQUEL) 25 MG tablet  GAD (generalized anxiety disorder)  Postsurgical hypothyroidism - Plan: T4, free, TSH  Need for influenza vaccination - Plan: Flu vaccine trivalent PF, 6mos and older(Flulaval,Afluria,Fluarix,Fluzone)   Plan:   Chronic, stable. Cont Crestor  20 mg/d. Cont Mounjaro  10 mg/week. Counseled on diet and exercise. 2/3. Chronic, unstable. Cont Zoloft  150 mg/d. Add Seroquel 25 mg/d. F/u in 1 mo. 4. Ck thyroid  studies on behalf of endo.  Flu shot today. The patient voiced understanding and agreement to the plan.  Mabel [place], DO 08/20/24 10:14 AM

## 2024-08-20 NOTE — Patient Instructions (Signed)
 Give us  2-3 business days to get the results of your labs back.   Let us  know if you need anything.  Let us  know if you need anything.

## 2024-09-10 ENCOUNTER — Other Ambulatory Visit

## 2024-09-20 ENCOUNTER — Ambulatory Visit: Admitting: Family Medicine

## 2024-10-03 ENCOUNTER — Other Ambulatory Visit: Payer: Self-pay | Admitting: Family Medicine

## 2024-10-03 DIAGNOSIS — E1165 Type 2 diabetes mellitus with hyperglycemia: Secondary | ICD-10-CM

## 2024-10-04 ENCOUNTER — Other Ambulatory Visit: Payer: Self-pay

## 2024-10-04 ENCOUNTER — Other Ambulatory Visit (HOSPITAL_BASED_OUTPATIENT_CLINIC_OR_DEPARTMENT_OTHER): Payer: Self-pay

## 2024-10-04 MED ORDER — ROSUVASTATIN CALCIUM 20 MG PO TABS
20.0000 mg | ORAL_TABLET | Freq: Every day | ORAL | 1 refills | Status: AC
  Filled 2024-10-04: qty 90, 90d supply, fill #0

## 2024-10-16 ENCOUNTER — Other Ambulatory Visit (HOSPITAL_BASED_OUTPATIENT_CLINIC_OR_DEPARTMENT_OTHER): Payer: Self-pay

## 2024-10-16 ENCOUNTER — Encounter (HOSPITAL_BASED_OUTPATIENT_CLINIC_OR_DEPARTMENT_OTHER): Payer: Self-pay

## 2024-10-21 ENCOUNTER — Other Ambulatory Visit (HOSPITAL_BASED_OUTPATIENT_CLINIC_OR_DEPARTMENT_OTHER): Payer: Self-pay

## 2024-10-23 ENCOUNTER — Telehealth (HOSPITAL_BASED_OUTPATIENT_CLINIC_OR_DEPARTMENT_OTHER): Payer: Self-pay

## 2024-10-23 ENCOUNTER — Other Ambulatory Visit (HOSPITAL_BASED_OUTPATIENT_CLINIC_OR_DEPARTMENT_OTHER): Payer: Self-pay

## 2024-10-23 NOTE — Telephone Encounter (Signed)
 Pharmacy Patient Advocate Encounter  Received notification from [facility] [employer] that Prior Authorization for Mounjaro  5MG /0.5ML auto-injectors  has been APPROVED from 10/23/24 to 10/23/25. Ran test claim, Copay is $0. This test claim was processed through [facility]- copay amounts may vary at other pharmacies due to pharmacy/plan contracts, or as the patient moves through the different stages of their insurance plan.   PA #/Case ID/Reference #: [id number]

## 2024-10-28 ENCOUNTER — Other Ambulatory Visit (HOSPITAL_BASED_OUTPATIENT_CLINIC_OR_DEPARTMENT_OTHER): Payer: Self-pay

## 2024-10-30 ENCOUNTER — Other Ambulatory Visit (HOSPITAL_BASED_OUTPATIENT_CLINIC_OR_DEPARTMENT_OTHER): Payer: Self-pay

## 2024-11-01 ENCOUNTER — Other Ambulatory Visit (HOSPITAL_BASED_OUTPATIENT_CLINIC_OR_DEPARTMENT_OTHER): Payer: Self-pay

## 2024-11-04 ENCOUNTER — Other Ambulatory Visit (HOSPITAL_BASED_OUTPATIENT_CLINIC_OR_DEPARTMENT_OTHER): Payer: Self-pay

## 2024-11-06 ENCOUNTER — Other Ambulatory Visit (HOSPITAL_BASED_OUTPATIENT_CLINIC_OR_DEPARTMENT_OTHER): Payer: Self-pay

## 2024-11-08 ENCOUNTER — Other Ambulatory Visit (HOSPITAL_BASED_OUTPATIENT_CLINIC_OR_DEPARTMENT_OTHER): Payer: Self-pay

## 2024-11-11 ENCOUNTER — Other Ambulatory Visit (HOSPITAL_BASED_OUTPATIENT_CLINIC_OR_DEPARTMENT_OTHER): Payer: Self-pay

## 2024-11-13 ENCOUNTER — Other Ambulatory Visit (HOSPITAL_BASED_OUTPATIENT_CLINIC_OR_DEPARTMENT_OTHER): Payer: Self-pay

## 2024-11-14 ENCOUNTER — Other Ambulatory Visit (HOSPITAL_BASED_OUTPATIENT_CLINIC_OR_DEPARTMENT_OTHER): Payer: Self-pay

## 2025-06-11 ENCOUNTER — Ambulatory Visit: Admitting: Internal Medicine
